# Patient Record
Sex: Male | Born: 1935 | Race: White | Hispanic: No | Marital: Married | Smoking: Former smoker
Health system: Southern US, Community
[De-identification: ages and names within clinical notes are randomized; demographics above are authoritative.]

## PROBLEM LIST (undated history)

## (undated) DIAGNOSIS — M109 Gout, unspecified: Secondary | ICD-10-CM

## (undated) DIAGNOSIS — D126 Benign neoplasm of colon, unspecified: Secondary | ICD-10-CM

## (undated) DIAGNOSIS — I350 Nonrheumatic aortic (valve) stenosis: Secondary | ICD-10-CM

## (undated) DIAGNOSIS — E785 Hyperlipidemia, unspecified: Secondary | ICD-10-CM

## (undated) DIAGNOSIS — C50922 Malignant neoplasm of unspecified site of left male breast: Secondary | ICD-10-CM

## (undated) DIAGNOSIS — N183 Chronic kidney disease, stage 3 unspecified: Secondary | ICD-10-CM

## (undated) DIAGNOSIS — I10 Essential (primary) hypertension: Secondary | ICD-10-CM

## (undated) DIAGNOSIS — K8309 Other cholangitis: Secondary | ICD-10-CM

## (undated) DIAGNOSIS — K219 Gastro-esophageal reflux disease without esophagitis: Secondary | ICD-10-CM

## (undated) DIAGNOSIS — D696 Thrombocytopenia, unspecified: Secondary | ICD-10-CM

## (undated) DIAGNOSIS — K8000 Calculus of gallbladder with acute cholecystitis without obstruction: Secondary | ICD-10-CM

## (undated) DIAGNOSIS — E119 Type 2 diabetes mellitus without complications: Secondary | ICD-10-CM

## (undated) DIAGNOSIS — R768 Other specified abnormal immunological findings in serum: Secondary | ICD-10-CM

## (undated) HISTORY — PX: WISDOM TOOTH EXTRACTION: SHX21

## (undated) HISTORY — PX: OTHER SURGICAL HISTORY: SHX169

## (undated) SURGERY — Surgical Case
Anesthesia: *Unknown

---

## 2000-03-31 ENCOUNTER — Encounter: Admission: RE | Admit: 2000-03-31 | Discharge: 2000-03-31 | Payer: Self-pay | Admitting: *Deleted

## 2001-09-16 ENCOUNTER — Encounter: Admission: RE | Admit: 2001-09-16 | Discharge: 2001-09-16 | Payer: Self-pay | Admitting: *Deleted

## 2002-08-18 ENCOUNTER — Ambulatory Visit (HOSPITAL_COMMUNITY): Admission: RE | Admit: 2002-08-18 | Discharge: 2002-08-18 | Payer: Self-pay | Admitting: *Deleted

## 2004-06-23 DIAGNOSIS — K8000 Calculus of gallbladder with acute cholecystitis without obstruction: Secondary | ICD-10-CM

## 2004-06-23 HISTORY — PX: LAPAROSCOPIC CHOLECYSTECTOMY: SUR755

## 2004-06-23 HISTORY — DX: Calculus of gallbladder with acute cholecystitis without obstruction: K80.00

## 2004-06-23 HISTORY — PX: ERCP W/ SPHICTEROTOMY: SHX1523

## 2004-06-28 ENCOUNTER — Emergency Department (HOSPITAL_COMMUNITY): Admission: EM | Admit: 2004-06-28 | Discharge: 2004-06-28 | Payer: Self-pay | Admitting: Emergency Medicine

## 2004-07-02 ENCOUNTER — Inpatient Hospital Stay (HOSPITAL_COMMUNITY): Admission: EM | Admit: 2004-07-02 | Discharge: 2004-07-08 | Payer: Self-pay | Admitting: Emergency Medicine

## 2004-07-04 ENCOUNTER — Encounter (INDEPENDENT_AMBULATORY_CARE_PROVIDER_SITE_OTHER): Payer: Self-pay | Admitting: Specialist

## 2008-02-25 ENCOUNTER — Encounter: Admission: RE | Admit: 2008-02-25 | Discharge: 2008-02-25 | Payer: Self-pay | Admitting: Internal Medicine

## 2010-07-13 ENCOUNTER — Encounter: Payer: Self-pay | Admitting: Internal Medicine

## 2010-07-13 ENCOUNTER — Encounter: Payer: Self-pay | Admitting: *Deleted

## 2010-11-08 NOTE — Op Note (Signed)
   NAME:  Jesse Hoffman, Jesse Hoffman                       ACCOUNT NO.:  [id number]   MEDICAL RECORD NO.:  [id number]                   PATIENT TYPE:  AMB   LOCATION:  ENDO                                 FACILITY:  [facility]   PHYSICIAN:  Georgiana Spinner, M.D.                 DATE OF BIRTH:  July 16, 1935   DATE OF PROCEDURE:  DATE OF DISCHARGE:                                 OPERATIVE REPORT   PROCEDURE:  Upper endoscopy.   INDICATIONS:  Gastroesophageal reflux disease.   ANESTHESIA:  Demerol 40, Versed 4 mg.   DESCRIPTION OF PROCEDURE:  With the patient mildly sedated in the left  lateral decubitus position, the Olympus videoscopic endoscope inserted in  the mouth, and passed under direct vision to the esophagus which appeared  normal.  There was no evidence of Barrett's.  The fundus, body, antrum,  duodenal bulb, second portion of the duodenum all appeared normal.  From  this point, the endoscope was slowly withdrawn taking several  circumferential views of the entire duodenal mucosa until the endoscope was  pulled back in the stomach, placed in retroflexion to view the stomach from  below.  The endoscope was straightened and withdrawn, taking circumferential  views of the remaining gastric and esophageal mucosa.  The patient's vital  signs and pulse oximetry remained stable. The patient tolerated the  procedure well without apparent complications.   FINDINGS:  Unremarkable exam.   PLAN:  Proceed with colonoscopy.                                               Georgiana Spinner, M.D.    GMO/MEDQ  D:  08/18/2002  T:  08/18/2002  Job:  [id number]

## 2010-11-08 NOTE — Discharge Summary (Signed)
Jesse Hoffman, Jesse Hoffman NO.:  [id number]   MEDICAL RECORD NO.:  [id number]          PATIENT TYPE:  INP   LOCATION:  5035                         FACILITY:  [facility]   PHYSICIAN:  Lonia Blood, M.D.      DATE OF BIRTH:  1935/08/14   DATE OF ADMISSION:  07/01/2004  DATE OF DISCHARGE:                                 DISCHARGE SUMMARY   PRIMARY CARE PHYSICIAN:  Georgianne Fick, M.D.   GASTROENTEROLOGIST:  Georgiana Spinner, M.D.   SURGEON:  Adolph Pollack, M.D.   DISCHARGE DIAGNOSES:  1.  Acute cholangitis, status post cholecystectomy and laparoscopic      cholecystectomy.  2.  Hypertension.  3.  Dyslipidemia.   DISCHARGE MEDICATIONS:  1.  Maxzide one tablet daily.  2.  Tenex 2 mg daily.  3.  Ciprofloxacin 500 mg p.o. b.i.d. for one week.  4.  Vicodin 5/500 mg one to two tablets p.o. q.6h. p.r.n.   PROCEDURES PERFORMED:  1.  Chest x-ray performed on June 28, 2004, showed linear left lower lobe      scarring or atelectasis and negative for acute process.  2.  Abdominal ultrasound performed June 28, 2004, showed gallbladder      sludge without stones or ultrasound evidence of cholecystitis and right      renal cyst.  3.  Endoscopic retrograde cholangiopancreatography with sphincterotomy      performed on July 03, 2004, showed multiple common bile duct stones      which were removed via sphincterotomy and balloon passage.  There may be      a few small stone fragments in the distal common bile duct on the final      image.  4.  Laparoscopic cholecystectomy performed by Dr. Abbey Chatters on July 04, 2004.   CONSULTATIONS:  1.  Georgiana Spinner, M.D., gastroenterology.  2.  Adolph Pollack, M.D., surgery.   BRIEF HISTORY:  Mr. Schroll is a 75 year old white male who presented with  abdominal pain, mainly right upper quadrant, described as colicky and  usually related to meals.  The patient was seen in the emergency room and at  time had normal  LFTs with gallbladder sludge noted on his ultrasound.  The  patient also had fever, chills nausea and vomiting which necessitated his  admission.  His temperature on admission was 103.7 degrees with some  elements of tachycardia.  Further assessment led to the diagnosis of acute  cholangitis versus cholecystitis.   HOSPITAL COURSE:  #1 - ACUTE CHOLANGITIS:  The patient was admitted and GI  consulted.  He was started on IV Zosyn and fluid resuscitation in the  beginning.  He responded very well to the antibiotics, however, after his  ERCP, it was noted that the patient would require cholecystectomy.  A  surgical consult was therefore sought and the patient underwent laparoscopic  cholecystectomy.  His LFTs have remained significantly high since admission.  His alkaline phosphatase was initially 380, AST 105 and ALT 118.  They  trended down after cholecystectomy to 384, ALT 80 and ALT 116 with  albumin  2.3.  As of today, however, LFTs seem to be rising again with an alkaline  phosphatase of 442, AST 76 and ALT 123.  Bilirubin still remains elevated at  3.3.  The patient is asymptomatic at this point.  However, if he becomes  symptomatic later, he may require further sphincterotomy since he has had  cholecystectomy.  He is to follow up with surgery as indicated by the end of  this week.   #2 - HYPERTENSION:  The patient has been taking two drugs at home.  During  this hospital admission, his blood pressure has gone up because his  antihypertensive were initially held.  This was restarted prior to discharge  and today his blood pressure is within acceptable limits of 139/90.   #3 - DYSLIPIDEMIA:  The patient carries a diagnosis of dyslipidemia.  He is,  however, not on any medications at this point.   #4 - HYPOKALEMIA:  The potassium level seems to be dropping during this  hospitalization mainly around 3.4.  This could be secondary to nausea and  vomiting as the patient had enteral body  depletion.  Potassium has been  repleted so far during this hospitalization.   #5 - TRANSIENT HYPONATREMIA:  This may also be related to some mild  dehydration secondary to the patient's situation.  He is, however, stable at  the time of this discharge.   DISCHARGE LABORATORIES:  The patient's sodium is still 129, potassium 3.4,  chloride 92, CO2 28, BUN 10, creatinine 0.8 and glucose 188.  His diuretics  may have something to do with his electrolytes.  Blood cultures performed  were also negative x 2.      [place]   LG/MEDQ  D:  07/08/2004  T:  07/08/2004  Job:  [id number]   cc:   Georgianne Fick, M.D.  [address]  [place]  [place] [address]  Fax: [phone]   Georgiana Spinner, M.D.  [address]  [place]  [place] [address]  Fax: [phone]   Adolph Pollack, M.D.  [address]  [place]  [place] [address]  Fax: [phone]

## 2010-11-08 NOTE — Consult Note (Signed)
Jesse Hoffman, Jesse Hoffman             ACCOUNT NO.:  [id number]   MEDICAL RECORD NO.:  [id number]          PATIENT TYPE:  INP   LOCATION:  5035                         FACILITY:  [facility]   PHYSICIAN:  Adolph Pollack, M.D.DATE OF BIRTH:  1936/05/13   DATE OF CONSULTATION:  07/02/2004  DATE OF DISCHARGE:                                   CONSULTATION   REASON FOR CONSULTATION:  Abdominal pain, jaundice, gallbladder sludge.   HISTORY OF PRESENT ILLNESS:  Mr. Statler ia s 75 year old male who has been  having some postprandial epigastric pain without radiation, nausea,  vomiting, or diarrhea.  It occurs approximately 2 hours after a meal. He  presented to the emergency department Friday, and underwent abdominal  ultrasound which showed gallbladder sludge.  Liver function tests  demonstrated a total bilirubin of 3.0, but the rest of them were not  elevated, at that time.   Last night he ate some sherbet, had severe pain, which was the worst he ever  had. He subsequently presented to the emergency department and was noted to  have fever, jaundice, and elevation of liver function tests.  He  subsequently was admitted with obstructive jaundice and we have been asked  to see him because of that. An ERCP is planned for tomorrow.   PAST MEDICAL HISTORY:  1.  Hypertension.  2.  Hyperlipidemia.   PREVIOUS OPERATIONS:  Bilateral knee arthroscopies.   ALLERGIES:  None.   HOME MEDICATIONS:  Lipitor, Maxzide, Tenex, Nexium, and Allegra.   SOCIAL HISTORY:  He is married and his wife is in the room with him. He  occasionally has an alcoholic beverage. He is not a smoker.   REVIEW OF SYSTEMS:  He has no known heart disease or dysrhythmia.  Pulmonary:  No lung disease, asthma, or tuberculosis.  Neurologic:  No  strokes or seizures. Hematologic:  No known anemia or bleeding disorders.  Endocrine:  No diabetes or thyroid disease.  GI:  No known diverticulitis or  colitis.  GU:  No kidney  stones.   PHYSICAL EXAMINATION:  GENERAL:  A well-developed, well-nourished male who  is in no acute distress, pleasant and cooperative.  VITAL SIGNS:  His maximum temperature was yesterday evening of 103.7 in the  emergency department.  Current temperature is 98 with a blood pressure of  114/65 and a pulse of 68.  SKIN:  His skin is warm and dry with jaundice.  HEENT:  Positive for icterus.  NECK:  No thyromegaly.  No JVD or bruits.  CHEST:  Breath sounds are equal and clear.  Respirations unlabored.  CARDIOVASCULAR:  Demonstrates a regular rate and rhythm with a soft systolic  murmur.  ABDOMEN:  Soft.  It is nondistended.  There is some tenderness in the right  upper quadrant and epigastric region.  He has no peritoneal signs.  The  bowel sounds are heard.  MUSCULOSKELETAL:  No edema.  Good peripheral pulses.   LABORATORY DATA:  On admission his AST was 125 and is now 143.  ALT on  admission was 119, now 126.  Total bilirubin 10.2 on admission, now 10.0.  Alkaline phosphatase 322 on admission, now 271.   Ultrasound of the gallbladder shows gallbladder sludge.   IMPRESSION:  Obstructive jaundice and acute cholangitis - white blood cell  count was 15,000 early this morning. He is currently on IV Zosyn.  ERCP is  planned for tomorrow.  He does not appear to be toxic at this time.   Gallbladder sludge which could be the etiology of his obstructive jaundice.   PLAN:  I agree with the ERCP in the a.m.  We are pending those results.  He  may need a laparoscopic cholecystectomy.  I discussed the procedure,  rationale, and risks with him.  The risks include but not limited to  bleeding, infection, anesthesia, postprandial diarrhea, accidental damage to  the small intestine, liver, or bile duct.  He seemed to understand these and  will be agreeable to proceed if it is indicated.      Tish Men  D:  07/02/2004  T:  07/02/2004  Job:  [id number]   cc:   Georgiana Spinner, M.D.  [address]  [place]  [place] [address]  Fax: [phone]   Georgianne Fick, M.D.  [address]  [place]  [place] [address]  Fax: [phone]

## 2010-11-08 NOTE — Op Note (Signed)
NAMEMACGREGOR, AESCHLIMAN             ACCOUNT NO.:  [id number]   MEDICAL RECORD NO.:  [id number]          PATIENT TYPE:  INP   LOCATION:  5035                         FACILITY:  [facility]   PHYSICIAN:  Adolph Pollack, M.D.DATE OF BIRTH:  18-Oct-1935   DATE OF PROCEDURE:  07/04/2004  DATE OF DISCHARGE:                                 OPERATIVE REPORT   PREOPERATIVE DIAGNOSIS:  Acute cholangitis and cholecystitis, status post  endoscopic retrograde cholangiopancreatography.   POSTOPERATIVE DIAGNOSIS:  Acute cholangitis and cholecystitis, status post  endoscopic retrograde cholangiopancreatography.   PROCEDURE:  Laparoscopic cholecystectomy.   SURGEON:  Adolph Pollack, M.D.   ASSISTANT:  Velora Heckler, M.D.   ANESTHESIA:  General.   INDICATIONS:  Mr. Slayden is a 75 year old male who was admitted with  obstructive jaundice.  He had some cholecystitis and cholangitis.  He  underwent an ERCP and extraction of common bile duct stones with  sphincterotomy.  He tolerated this well.  He is now brought to the operating  room for laparoscopic cholecystectomy.   TECHNIQUE:  He was seen in the holding area and then brought to the  operating room and placed supine on the operating table, and a general  anesthetic was administered.  Hair was clipped from the abdominal wall, and  the abdominal wall was sterilely prepped and draped.  Dilute Marcaine  solution was infiltrated in the subumbilical region and a small subumbilical  incision was made through the skin and subcutaneous tissue.  The midline  fascia was identified and a small incision made within the midline fascia in  the peritoneum.  The peritoneal cavity was then entered under direct vision.  A pursestring suture was placed around the fascial edges.  A Hasson trocar  was introduced to the peritoneal cavity and pneumoperitoneum was created by  insufflation of CO2 gas.   Next the laparoscope was introduced and omental adhesions to  the gallbladder  were noted.  An 11 mm trocar was placed in the epigastric port and two 5 mm  trocars placed in the right upper quadrant.  He was placed in the reverse  Trendelenburg position with his right side tilted slightly up.   Using careful blunt dissection, the omentum was dissected free from an  acutely inflamed gallbladder.  The fundus of the gallbladder was grasped and  retracted toward the right shoulder.  The infundibulum was grasped as well.  There was just a lot of inflammatory change down by the infundibulum.  I  used careful blunt dissection and stayed on the gallbladder and also used  hydrodissection to identify the cystic artery.  I isolated this and created  a window around it.  I then clipped it and divided it.  I then carefully  identified the cystic duct.  It was a short cystic duct.  I could identify  its junction with the right hepatic duct to form the common bile duct.  I  created a window around the cystic duct, put two clips on it, and then  divided it.  One of the clips fell off; however, the remaining clip had  sealed it  and throughout the case I inspected this and there was no bile  leak from it.  It was somewhat of a necrotic cystic duct, however.   I then used the electrocautery to dissect the gallbladder free from the  liver bed.  There was a small perforation in the gallbladder and some  purulent material draining from it.  The gallbladder was then placed in an  Endopouch bag.   Using 2 L of saline, the perihepatic area was copiously irrigated and fluid  evacuated.  Bleeding points in the gallbladder fossa were controlled with  cautery.  I placed Surgicel in the gallbladder fossa.  A drain was  introduced through the peritoneal cavity and placed in the gallbladder fossa  and exited out the lateralmost 5 mm trocar site.  It was anchored to the  skin with 3-0 nylon suture.   Following this, hemostasis was noted to be adequate.  We evacuated as much   irrigation fluid as possible.  I then removed the subumbilical trocar and  under laparoscopic vision closed the subumbilical fascial defect by  tightening up the pursestring suture and putting an interrupted 0 Vicryl  suture in where there was a remaining fascial defect superiorly.  Following  this, all trocars were removed and pneumoperitoneum was released.  The skin  incisions were closed with 4-0 Monocryl subcuticular stitches.  Steri-Strips  and sterile dressing were applied.   He tolerated the procedure well without any apparent complications.  He was  taken to the recovery room in satisfactory condition.      Tish Men  D:  07/04/2004  T:  07/04/2004  Job:  [id number]   cc:   Georgiana Spinner, M.D.  [address]  [place]  [place] [address]  Fax: [phone]

## 2010-11-08 NOTE — H&P (Signed)
Jesse Hoffman, Jesse Hoffman             ACCOUNT NO.:  [id number]   MEDICAL RECORD NO.:  [id number]          PATIENT TYPE:  INP   LOCATION:  1823                         FACILITY:  [facility]   PHYSICIAN:  Jonna L. Robb Matar, M.D.DATE OF BIRTH:  Dec 15, 1935   DATE OF ADMISSION:  07/01/2004  DATE OF DISCHARGE:                                HISTORY & PHYSICAL   CHIEF COMPLAINT:  Abdominal pain.   HISTORY:  This is a 75 year old white male who was seen last week with right  upper quadrant abdominal pain.  It was colicky in nature.  Started about 2  hours postprandially.  Lasted for about 4 or 5 hours and then cleared up.  He was seen in the emergency room and at that time he had a bilirubin of  3.0.  Normal liver function tests.  Ultrasound of the gallbladder showed  sludge.  He was suppose to have a HIDA done yesterday, but instead got  fever, chills, nausea or vomiting and comes into the emergency room at this  time with a bilirubin of 10.   ALLERGIES:  None.   MEDICATIONS:  Maxzide 25 daily.  Vicodin.  Nexium 40 daily.  Allegra daily  and Lipitor every other day.  He does not remember the dosages.   PAST MEDICAL HISTORY:  1.  Hypertension.  2. Allergic rhinitis.  3. Hypercholesterolemia.  4.      Borderline sugars.   FAMILY HISTORY:  His father died of heart disease as did his mother dying at  52.   SOCIAL HISTORY:  Nonsmoker, nondrinker, no drugs.  married.  One son, one  daughter, no medical problems.   REVIEW OF SYSTEMS:  The patient has had several moles removed.  No visual  problems.  He has had fever and weakness and shaking chills.  No coughing,  wheezing or asthma.  He says that the pain was fairly high up so he actually  had a cardiogram done which was felt not to show heart disease.  He has not  had any hematemesis or melena.  No hematuria, no prostate problems or STDs.  No thyroid problems.  No anemia or bleeding problems.  No skin rashes.  No  history of arthritis or  stroke.   PHYSICAL EXAMINATION:  VITAL SIGNS:  Temperature 103.7, pulse 111,  respirations 24, blood pressure 129/72, 90% on room air.  GENERAL:  He is a well-developed, jaundiced, white male presently in no  acute distress.  HEENT: Conjunctivae and lids were normal.  Sclerae were icteric.  Pupils are  reactive to light.  EOMs are full.  There is normal hearing, mucosa and  pharynx.  NECK:  Shows no carotid bruits or jugulovenous distension.  RESPIRATORY:  Effort is normal.  LUNGS:  Are clear to A&P without wheezes, rales, rhonchi or dullness.  HEART:  Regular rate and rhythm.  There is a 1/6 systolic ejection murmur.  There were no bruits, cyanosis, clubbing or edema.  ABDOMEN:  Nontender without hepatosplenomegaly or inspirational tenderness  or hernia.  EXTERNAL GENITALIA:  Normal.  Just has cervical and inguinal adenopathy.  MUSCULOSKELETAL:  Muscle strength  is 5/5 with full range of motion.  SKIN:  Jaundiced, but otherwise has no rash, lesions, nodules or induration.  CRANIAL NERVES:  There are normal cranial nerves, full EOMs.  EXTREMITIES:  DTRs are 2+ and equal.  Toes go down.  Sensation was normal.  NEUROLOGICAL:  He is alert and oriented x3.  Normal memory, judgment and  affect.   LABORATORY DATA:  White count 7.4, but with 95% neutrophils I am concerned  because a week ago his white count was 10.7.  Blood and urine cultures are  pending.  Urinalysis was positive for leukocyte esterase and nitrite.  Protime is 13.9. Potassium 3.4, normal BUN and creatinine.  Bilirubin is  10.10, SGOT 125, SGPT 119, alkaline phosphatase up to 322.  These are  significantly different than last week's readings.   IMPRESSION:  1.  Acute cholecystitis and acute cholangitis.  I will put him on Zosyn, IV      fluids, consult with Dr. Virginia Rochester and arrange for ERCP.  Most likely this is      an impacted gallstone and the patient will ultimately go on to      cholecystectomy.  2. Hypertension.  I will  hold his medications.  3.      Hypercholesterolemia.  Medications will be held.  4. Allergic rhinitis.      JLB/MEDQ  D:  07/02/2004  T:  07/02/2004  Job:  [id number]   cc:   Georgiana Spinner, M.D.  [address]  [place]  [place] [address]  Fax: [phone]

## 2010-11-08 NOTE — Op Note (Signed)
   NAME:  ORA, MCNATT                       ACCOUNT NO.:  [id number]   MEDICAL RECORD NO.:  [id number]                   PATIENT TYPE:  AMB   LOCATION:  ENDO                                 FACILITY:  [facility]   PHYSICIAN:  Georgiana Spinner, M.D.                 DATE OF BIRTH:  04-25-1936   DATE OF PROCEDURE:  DATE OF DISCHARGE:                                 OPERATIVE REPORT   PROCEDURE PERFORMED:  Colonoscopy.   ENDOSCOPIST:  Georgiana Spinner, M.D.   INDICATIONS FOR PROCEDURE:  Colon cancer screening.   ANESTHESIA:  Demerol 30 mg, Versed 3 mg.   DESCRIPTION OF PROCEDURE:  With the patient mildly sedated in the left  lateral decubitus position, the Olympus video colonoscope was inserted in  the rectum and passed under direct vision to the cecum, identified by the  ileocecal valve and appendiceal orifice.  From this point the colonoscope  was slowly withdrawn taking circumferential views of the entire colonic  mucosa stopping only in the rectum which appeared normal on direct and  showed hemorrhoids on retroflex view.  The endoscope was straightened and  withdrawn.  The patient's vital signs and pulse oximeter remained stable.  The patient tolerated the procedure well without apparent complications.   FINDINGS:  Internal hemorrhoids, diverticulosis of sigmoid colon.  Otherwise  unremarkable examination.   PLAN:  Repeat examination possibly in five years.                                                  Georgiana Spinner, M.D.    GMO/MEDQ  D:  08/18/2002  T:  08/18/2002  Job:  [id number]

## 2010-11-08 NOTE — Op Note (Signed)
Jesse Hoffman, MUSCAT NO.:  [id number]   MEDICAL RECORD NO.:  [id number]          PATIENT TYPE:  INP   LOCATION:  5035                         FACILITY:  [facility]   PHYSICIAN:  Georgiana Spinner, M.D.    DATE OF BIRTH:  [birth year]   DATE OF PROCEDURE:  07/03/2004  DATE OF DISCHARGE:                                 OPERATIVE REPORT   PROCEDURE:  ERCP with stone removal after sphincterotomy.   ANESTHESIA:  Demerol 100 mg, Versed 12.5 mg, Glucagon 1 mg given  intravenously.   DESCRIPTION OF PROCEDURE:  With the patient mildly sedated in the prone  position in Room 9 of radiology at [facility], the Olympus high-  viewing duodenoscope was inserted in the mouth, passed through the esophagus  into the stomach.  Then subsequently passed into the small bowel under  direct vision.  Then placed in the shortened position to find the ampulla of  Vater.  It was noted that this was in a diverticulum, and a photograph was  taken from the distance and also up close.  We then passed a Wilson-Cooke  tri tome catheter, and easily on the first pass got direct cannulation of  the bile duct.  Contrast was injected and it was seen that there multiple  filling defects, compatible with gallstones, seen in the common bile duct.   Therefore, a sphincterotomy was made and the sphincterotome was removed.  A  12 mm balloon catheter was passed subsequently and intubation of the common  bile duct was obtained with the catheter.  The balloon was inflated and  debris was pulled out.  We did a number of sweeps, until I felt we had  removed all of the stone and debris material; at which point, I pulled the  balloon catheter back to the ostia and inflated the balloon at that point.  Checked contrast and saw no further filling defects.  At which point, we  then deflated the balloon, pulled it back into the endoscope and removed the  endoscope.   Subsequently, radiologic confirmation was obtained  with Dr. Pecolia Ades, who  agreed.  The procedure was terminated.  The patient's vital signs, pulse  oximetry remained stable.  The patient tolerated the procedure well without  apparent complications.   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  Removed after sphincterotomy and balloon pull-through.   PLAN:  If clinically stable, proceed with laparoscopic cholecystectomy.       GMO/MEDQ  D:  07/03/2004  T:  07/03/2004  Job:  [id number]   cc:   Adolph Pollack, M.D.  [address]  [place]  [place] [address]  Fax: [phone]

## 2011-11-14 ENCOUNTER — Ambulatory Visit (INDEPENDENT_AMBULATORY_CARE_PROVIDER_SITE_OTHER): Payer: Medicare Other | Admitting: Family Medicine

## 2011-11-14 VITALS — BP 116/72 | HR 62 | Temp 97.8°F | Resp 18 | Ht 72.0 in | Wt 186.0 lb

## 2011-11-14 DIAGNOSIS — L259 Unspecified contact dermatitis, unspecified cause: Secondary | ICD-10-CM

## 2011-11-14 DIAGNOSIS — L258 Unspecified contact dermatitis due to other agents: Secondary | ICD-10-CM

## 2011-11-14 MED ORDER — FAMOTIDINE 20 MG PO TABS: 20.0000 mg | ORAL_TABLET | Freq: Two times a day (BID) | ORAL | Status: DC

## 2011-11-14 MED ORDER — CETIRIZINE HCL 10 MG PO TABS: 10.0000 mg | ORAL_TABLET | Freq: Every day | ORAL | Status: DC

## 2011-11-14 MED ORDER — PREDNISONE 10 MG PO TABS: 10.0000 mg | ORAL_TABLET | Freq: Every day | ORAL | Status: DC

## 2011-11-14 NOTE — Progress Notes (Signed)
  Subjective:    Patient ID: Jesse Hoffman, male    DOB: Jan 07, 1936, 76 y.o.   MRN: [id number]  HPI Patient presents concerned about periorbital swelling that wife noticed last night.   Today swelling around eye seems worse.  Denies ocular discharge or ocular pain. No change in visual acuity. No eye redness  No recent illness or eye trauma  Last week working in the yard and developed contact dermatitis; has similar lesion on (R) lower extremity  Review of Systems     Objective:   Physical Exam  Constitutional: He appears well-developed.  HENT:  Head: Normocephalic.  Eyes: Conjunctivae, EOM and lids are normal. Pupils are equal, round, and reactive to light. Right eye exhibits no discharge. Left eye exhibits no discharge.    Neck: Neck supple.  Cardiovascular: Normal rate, regular rhythm and normal heart sounds.   Pulmonary/Chest: Effort normal and breath sounds normal.  Neurological: He is alert.  Skin:       (L) zygomatic region erythematous lesion with discrete border and surrounding edema(see scanned picture)        Assessment & Plan:   1. Contact dermatitis  famotidine (PEPCID) 20 MG tablet, cetirizine (ZYRTEC) 10 MG tablet, predniSONE (DELTASONE) 10 MG tablet   Anticipatory guidance

## 2011-12-22 DIAGNOSIS — M109 Gout, unspecified: Secondary | ICD-10-CM | POA: Insufficient documentation

## 2011-12-22 HISTORY — DX: Gout, unspecified: M10.9

## 2011-12-25 ENCOUNTER — Ambulatory Visit (INDEPENDENT_AMBULATORY_CARE_PROVIDER_SITE_OTHER): Payer: Medicare Other | Admitting: Emergency Medicine

## 2011-12-25 ENCOUNTER — Ambulatory Visit: Payer: Medicare Other

## 2011-12-25 VITALS — BP 112/64 | HR 72 | Temp 98.4°F | Resp 16 | Ht 71.5 in | Wt 183.0 lb

## 2011-12-25 DIAGNOSIS — M79609 Pain in unspecified limb: Secondary | ICD-10-CM

## 2011-12-25 DIAGNOSIS — M79676 Pain in unspecified toe(s): Secondary | ICD-10-CM

## 2011-12-25 DIAGNOSIS — M79672 Pain in left foot: Secondary | ICD-10-CM

## 2011-12-25 LAB — POCT CBC
Granulocyte percent: 68.7 %G (ref 37–80)
HCT, POC: 43.2 % — AB (ref 43.5–53.7)
Lymph, poc: 2 (ref 0.6–3.4)
MCHC: 31.9 g/dL (ref 31.8–35.4)
MID (cbc): 0.5 (ref 0–0.9)
POC Granulocyte: 5.5 (ref 2–6.9)
POC LYMPH PERCENT: 24.6 %L (ref 10–50)
Platelet Count, POC: 184 10*3/uL (ref 142–424)
RDW, POC: 13.2 %

## 2011-12-25 MED ORDER — HYDROCODONE-ACETAMINOPHEN 5-325 MG PO TABS: 1.0000 | ORAL_TABLET | Freq: Four times a day (QID) | ORAL | Status: AC | PRN

## 2011-12-25 MED ORDER — PREDNISONE 20 MG PO TABS: ORAL_TABLET | ORAL | Status: DC

## 2011-12-25 NOTE — Patient Instructions (Addendum)
Gout Gout is an inflammatory condition (arthritis) caused by a buildup of uric acid crystals in the joints. Uric acid is a chemical that is normally present in the blood. Under some circumstances, uric acid can form into crystals in your joints. This causes joint redness, soreness, and swelling (inflammation). Repeat attacks are common. Over time, uric acid crystals can form into masses (tophi) near a joint, causing disfigurement. Gout is treatable and often preventable. CAUSES  The disease begins with elevated levels of uric acid in the blood. Uric acid is produced by your body when it breaks down a naturally found substance called purines. This also happens when you eat certain foods such as meats and fish. Causes of an elevated uric acid level include:  Being passed down from parent to child (heredity).   Diseases that cause increased uric acid production (obesity, psoriasis, some cancers).   Excessive alcohol use.   Diet, especially diets rich in meat and seafood.   Medicines, including certain cancer-fighting drugs (chemotherapy), diuretics, and aspirin.   Chronic kidney disease. The kidneys are no longer able to remove uric acid well.   Problems with metabolism.  Conditions strongly associated with gout include:  Obesity.   High blood pressure.   High cholesterol.   Diabetes.  Not everyone with elevated uric acid levels gets gout. It is not understood why some people get gout and others do not. Surgery, joint injury, and eating too much of certain foods are some of the factors that can lead to gout. SYMPTOMS   An attack of gout comes on quickly. It causes intense pain with redness, swelling, and warmth in a joint.   Fever can occur.   Often, only one joint is involved. Certain joints are more commonly involved:   Base of the big toe.   Knee.   Ankle.   Wrist.   Finger.  Without treatment, an attack usually goes away in a few days to weeks. Between attacks, you  usually will not have symptoms, which is different from many other forms of arthritis. DIAGNOSIS  Your caregiver will suspect gout based on your symptoms and exam. Removal of fluid from the joint (arthrocentesis) is done to check for uric acid crystals. Your caregiver will give you a medicine that numbs the area (local anesthetic) and use a needle to remove joint fluid for exam. Gout is confirmed when uric acid crystals are seen in joint fluid, using a special microscope. Sometimes, blood, urine, and X-ray tests are also used. TREATMENT  There are 2 phases to gout treatment: treating the sudden onset (acute) attack and preventing attacks (prophylaxis). Treatment of an Acute Attack  Medicines are used. These include anti-inflammatory medicines or steroid medicines.   An injection of steroid medicine into the affected joint is sometimes necessary.   The painful joint is rested. Movement can worsen the arthritis.   You may use warm or cold treatments on painful joints, depending which works best for you.   Discuss the use of coffee, vitamin C, or cherries with your caregiver. These may be helpful treatment options.  Treatment to Prevent Attacks After the acute attack subsides, your caregiver may advise prophylactic medicine. These medicines either help your kidneys eliminate uric acid from your body or decrease your uric acid production. You may need to stay on these medicines for a very long time. The early phase of treatment with prophylactic medicine can be associated with an increase in acute gout attacks. For this reason, during the first few months   of treatment, your caregiver may also advise you to take medicines usually used for acute gout treatment. Be sure you understand your caregiver's directions. You should also discuss dietary treatment with your caregiver. Certain foods such as meats and fish can increase uric acid levels. Other foods such as dairy can decrease levels. Your caregiver  can give you a list of foods to avoid. HOME CARE INSTRUCTIONS   Do not take aspirin to relieve pain. This raises uric acid levels.   Only take over-the-counter or prescription medicines for pain, discomfort, or fever as directed by your caregiver.   Rest the joint as much as possible. When in bed, keep sheets and blankets off painful areas.   Keep the affected joint raised (elevated).   Use crutches if the painful joint is in your leg.   Drink enough water and fluids to keep your urine clear or pale yellow. This helps your body get rid of uric acid. Do not drink alcoholic beverages. They slow the passage of uric acid.   Follow your caregiver's dietary instructions. Pay careful attention to the amount of protein you eat. Your daily diet should emphasize fruits, vegetables, whole grains, and fat-free or low-fat milk products.   Maintain a healthy body weight.  SEEK MEDICAL CARE IF:   You have an oral temperature above 102 F (38.9 C).   You develop diarrhea, vomiting, or any side effects from medicines.   You do not feel better in 24 hours, or you are getting worse.  SEEK IMMEDIATE MEDICAL CARE IF:   Your joint becomes suddenly more tender and you have:   Chills.   An oral temperature above 102 F (38.9 C), not controlled by medicine.  MAKE SURE YOU:   Understand these instructions.   Will watch your condition.   Will get help right away if you are not doing well or get worse.  Document Released: 06/06/2000 Document Revised: 05/29/2011 Document Reviewed: 09/17/2009 [employer] Patient Information 2012 [employer]. 

## 2011-12-25 NOTE — Progress Notes (Signed)
  Subjective:    Patient ID: Jesse Hoffman, male    DOB: February 11, 1936, 76 y.o.   MRN: [id number]  HPI patient had a crowbar fall ion his left  foot approximately 2 days ago.Last night he noticed pain and swelling over the base of his  great toe to he has difficulty with weightbearing. The side of his foot is also become red and exquisitely tender. Pertinent history reveals he also is on diuretics for hypertension    Review of Systems     Objective:   Physical ExamThe base of the right great toe is red and swollen to her pain with movement of the great toe. The foot itself is also somewhat puffy. The ankle is normal.    [facility] reading (PRIMARY) by  Dr. Cleta Alberts x-ray is normal no fractures  No results found for this or any previous visit. No results found for this or any previous visit. Results for orders placed in visit on 12/25/11  POCT CBC      Component Value Range   WBC 8.0  4.6 - 10.2 K/uL   Lymph, poc 2.0  0.6 - 3.4   POC LYMPH PERCENT 24.6  10 - 50 %L   MID (cbc) 0.5  0 - 0.9   POC MID % 6.7  0 - 12 %M   POC Granulocyte 5.5  2 - 6.9   Granulocyte percent 68.7  37 - 80 %G   RBC 4.59 (*) 4.69 - 6.13 M/uL   Hemoglobin 13.8 (*) 14.1 - 18.1 g/dL   HCT, POC 11.9 (*) 14.7 - 53.7 %   MCV 94.2  80 - 97 fL   MCH, POC 30.1  27 - 31.2 pg   MCHC 31.9  31.8 - 35.4 g/dL   RDW, POC 82.9     Platelet Count, POC 184  142 - 424 K/uL   MPV 7.4  0 - 99.8 fL      Assessment & Plan:  I suspect this is gout. We'll treat with a taper dose of prednisone and given the patient's medication for pain. Uric acid is pending. We'll treat with a tapered dose of prednisone I gave him a few hydrocodone for pain

## 2012-01-19 ENCOUNTER — Encounter (HOSPITAL_COMMUNITY): Payer: Self-pay | Admitting: Emergency Medicine

## 2012-01-19 ENCOUNTER — Inpatient Hospital Stay (HOSPITAL_COMMUNITY)
Admission: EM | Admit: 2012-01-19 | Discharge: 2012-01-23 | DRG: 872 | Disposition: A | Discharge status: Home / Self Care | Payer: Medicare Other | Attending: Internal Medicine | Admitting: Internal Medicine

## 2012-01-19 ENCOUNTER — Emergency Department (HOSPITAL_COMMUNITY): Payer: Medicare Other

## 2012-01-19 DIAGNOSIS — D649 Anemia, unspecified: Secondary | ICD-10-CM | POA: Diagnosis present

## 2012-01-19 DIAGNOSIS — R748 Abnormal levels of other serum enzymes: Secondary | ICD-10-CM

## 2012-01-19 DIAGNOSIS — N179 Acute kidney failure, unspecified: Secondary | ICD-10-CM

## 2012-01-19 DIAGNOSIS — R109 Unspecified abdominal pain: Secondary | ICD-10-CM

## 2012-01-19 DIAGNOSIS — N189 Chronic kidney disease, unspecified: Secondary | ICD-10-CM | POA: Diagnosis present

## 2012-01-19 DIAGNOSIS — R6510 Systemic inflammatory response syndrome (SIRS) of non-infectious origin without acute organ dysfunction: Secondary | ICD-10-CM

## 2012-01-19 DIAGNOSIS — R7989 Other specified abnormal findings of blood chemistry: Secondary | ICD-10-CM

## 2012-01-19 DIAGNOSIS — R17 Unspecified jaundice: Secondary | ICD-10-CM

## 2012-01-19 DIAGNOSIS — E860 Dehydration: Secondary | ICD-10-CM

## 2012-01-19 DIAGNOSIS — N289 Disorder of kidney and ureter, unspecified: Secondary | ICD-10-CM

## 2012-01-19 DIAGNOSIS — D696 Thrombocytopenia, unspecified: Secondary | ICD-10-CM | POA: Diagnosis present

## 2012-01-19 DIAGNOSIS — A419 Sepsis, unspecified organism: Principal | ICD-10-CM

## 2012-01-19 DIAGNOSIS — E861 Hypovolemia: Secondary | ICD-10-CM | POA: Diagnosis present

## 2012-01-19 HISTORY — DX: Gout, unspecified: M10.9

## 2012-01-19 HISTORY — DX: Essential (primary) hypertension: I10

## 2012-01-19 HISTORY — DX: Other cholangitis: K83.09

## 2012-01-19 HISTORY — DX: Other specified abnormal immunological findings in serum: R76.8

## 2012-01-19 HISTORY — DX: Calculus of gallbladder with acute cholecystitis without obstruction: K80.00

## 2012-01-19 LAB — COMPREHENSIVE METABOLIC PANEL
CO2: 24 mEq/L (ref 19–32)
Chloride: 97 mEq/L (ref 96–112)
Creatinine, Ser: 1.97 mg/dL — ABNORMAL HIGH (ref 0.50–1.35)
GFR calc Af Amer: 37 mL/min — ABNORMAL LOW (ref >90)
GFR calc non Af Amer: 32 mL/min — ABNORMAL LOW (ref >90)
Potassium: 3.5 mEq/L (ref 3.5–5.1)
Sodium: 135 mEq/L (ref 135–145)
Total Protein: 6 g/dL (ref 6.0–8.3)

## 2012-01-19 LAB — CBC WITH DIFFERENTIAL/PLATELET
Basophils Absolute: 0 10*3/uL (ref 0.0–0.1)
Basophils Relative: 0 % (ref 0–1)
Eosinophils Absolute: 0 K/uL (ref 0.0–0.7)
Eosinophils Relative: 0 % (ref 0–5)
HCT: 33.9 % — ABNORMAL LOW (ref 39.0–52.0)
Hemoglobin: 12 g/dL — ABNORMAL LOW (ref 13.0–17.0)
Lymphocytes Relative: 3 % — ABNORMAL LOW (ref 12–46)
Lymphs Abs: 0.3 K/uL — ABNORMAL LOW (ref 0.7–4.0)
MCH: 31.2 pg (ref 26.0–34.0)
MCHC: 35.4 g/dL (ref 30.0–36.0)
MCV: 88.1 fL (ref 78.0–100.0)
Monocytes Absolute: 0.6 K/uL (ref 0.1–1.0)
Monocytes Relative: 7 % (ref 3–12)
Neutro Abs: 8.1 K/uL — ABNORMAL HIGH (ref 1.7–7.7)
Neutrophils Relative %: 90 % — ABNORMAL HIGH (ref 43–77)
Platelets: 114 10*3/uL — ABNORMAL LOW (ref 150–400)
RBC: 3.85 MIL/uL — ABNORMAL LOW (ref 4.22–5.81)
RDW: 13.1 % (ref 11.5–15.5)
WBC: 9 10*3/uL (ref 4.0–10.5)

## 2012-01-19 LAB — LIPASE, BLOOD: Lipase: 43 U/L (ref 11–59)

## 2012-01-19 LAB — APTT: aPTT: 35 seconds (ref 24–37)

## 2012-01-19 LAB — LACTIC ACID, PLASMA: Lactic Acid, Venous: 2.8 mmol/L — ABNORMAL HIGH (ref 0.5–2.2)

## 2012-01-19 LAB — PROTIME-INR
INR: 1.42 (ref 0.00–1.49)
Prothrombin Time: 17.6 seconds — ABNORMAL HIGH (ref 11.6–15.2)

## 2012-01-19 LAB — COMPREHENSIVE METABOLIC PANEL WITH GFR
ALT: 291 U/L — ABNORMAL HIGH (ref 0–53)
AST: 219 U/L — ABNORMAL HIGH (ref 0–37)
Albumin: 3.3 g/dL — ABNORMAL LOW (ref 3.5–5.2)
Alkaline Phosphatase: 147 U/L — ABNORMAL HIGH (ref 39–117)
BUN: 19 mg/dL (ref 6–23)
Calcium: 8.6 mg/dL (ref 8.4–10.5)
Glucose, Bld: 136 mg/dL — ABNORMAL HIGH (ref 70–99)
Total Bilirubin: 6.8 mg/dL — ABNORMAL HIGH (ref 0.3–1.2)

## 2012-01-19 LAB — PROCALCITONIN: Procalcitonin: 29.74 ng/mL

## 2012-01-19 MED ORDER — SODIUM CHLORIDE 0.9 % IV BOLUS (SEPSIS)
1000.0000 mL | Freq: Once | INTRAVENOUS | Status: AC
  Administered 2012-01-20: 1000 mL via INTRAVENOUS

## 2012-01-19 MED ORDER — HEPARIN SODIUM (PORCINE) 5000 UNIT/ML IJ SOLN
5000.0000 [IU] | Freq: Three times a day (TID) | INTRAMUSCULAR | Status: DC
  Administered 2012-01-20: 5000 [IU] via SUBCUTANEOUS
  Filled 2012-01-19 (×4): qty 1

## 2012-01-19 MED ORDER — SODIUM CHLORIDE 0.9 % IV BOLUS (SEPSIS)
1000.0000 mL | Freq: Once | INTRAVENOUS | Status: AC
  Administered 2012-01-19: 1000 mL via INTRAVENOUS

## 2012-01-19 MED ORDER — PANTOPRAZOLE SODIUM 40 MG IV SOLR
40.0000 mg | Freq: Every day | INTRAVENOUS | Status: DC
  Administered 2012-01-20 – 2012-01-22 (×4): 40 mg via INTRAVENOUS
  Filled 2012-01-19 (×5): qty 40

## 2012-01-19 MED ORDER — SODIUM CHLORIDE 0.9 % IV BOLUS (SEPSIS): 500.0000 mL | Freq: Once | INTRAVENOUS | Status: DC

## 2012-01-19 MED ORDER — PIPERACILLIN-TAZOBACTAM 3.375 G IVPB 30 MIN
3.3750 g | Freq: Once | INTRAVENOUS | Status: AC
  Administered 2012-01-19: 3.375 g via INTRAVENOUS
  Filled 2012-01-19: qty 50

## 2012-01-19 MED ORDER — SODIUM CHLORIDE 0.9 % IV BOLUS (SEPSIS)
500.0000 mL | Freq: Once | INTRAVENOUS | Status: DC
  Administered 2012-01-19: 500 mL via INTRAVENOUS

## 2012-01-19 NOTE — H&P (Addendum)
Name: Jesse Hoffman MRN: [id number] DOB: Nov 19, 1935    LOS: 0  Referring Provider:  EDP Reason for Referral:  Hypotension  PULMONARY / CRITICAL CARE MEDICINE  HPI:  76 yo admitted on 7/29 with generalized weakness abdominal discomfort, fever and chills for several days.  Seen by PCP and noted to be hypotensive and have elevated liver enzymes.  Does report some upper abdominal discomfort, but no nausea or vomiting.   Denies frequency, urgency, dysuria.  No cough, chest pain or mucus production.  Also reports decreased oral intake.  Continued to take diuretics.  History of cholecystectomy 5 years ago.  Of note, evaluated for suspected hepatitis ([facility], about 4 years ago) but deemed to be disease free.  Past Medical History  Diagnosis Date  . Hypertension   . Gout    Past Surgical History  Procedure Date  . Cholecystectomy    Prior to Admission medications   Medication Sig Start Date End Date Taking? Authorizing Provider  atorvastatin (LIPITOR) 20 MG tablet Take 20 mg by mouth daily.   Yes Historical Provider, MD  cetirizine (ZYRTEC) 10 MG tablet Take 1 tablet (10 mg total) by mouth daily. 11/14/11 11/13/12 Yes Dois Davenport, MD  esomeprazole (NEXIUM) 40 MG capsule Take 40 mg by mouth daily before breakfast.   Yes Historical Provider, MD  famotidine (PEPCID) 20 MG tablet Take 1 tablet (20 mg total) by mouth 2 (two) times daily. 11/14/11 11/13/12 Yes Dois Davenport, MD  guanFACINE (TENEX) 2 MG tablet Take 2 mg by mouth at bedtime.   Yes Historical Provider, MD  Multiple Vitamins-Minerals (CENTRUM SILVER PO) Take 1 tablet by mouth daily.   Yes Historical Provider, MD  omega-3 acid ethyl esters (LOVAZA) 1 G capsule Take 1 g by mouth 2 (two) times daily.   Yes Historical Provider, MD  pyridOXINE (VITAMIN B-6) 100 MG tablet Take 100 mg by mouth daily.   Yes Historical Provider, MD  triamterene-hydrochlorothiazide (MAXZIDE) 75-50 MG per tablet Take 1 tablet by mouth daily.   Yes Historical  Provider, MD   Allergies No Known Allergies  Family History History reviewed. No pertinent family history. Social History  reports that he has never smoked. He does not have any smokeless tobacco history on file. He reports that he drinks alcohol. His drug history not on file.  Review Of Systems:  As per HPI, otherwise negative.  Brief patient description:  76 y/o admitted with fevers, hypotension and jaundice.  Events Since Admission: 7/29  Admitted with fevers, hypotension and jaundice  Current Status:  Vital Signs: Temp:  [98.5 F (36.9 C)-99.9 F (37.7 C)] 98.8 F (37.1 C) (07/29 2200) Pulse Rate:  [71-112] 71  (07/29 2315) Resp:  [0-20] 17  (07/29 2315) BP: (70-93)/(31-56) 93/53 mmHg (07/29 2315) SpO2:  [93 %-99 %] 98 % (07/29 2315)  Physical Examination: General:  No distress, comfortable Neuro:  Awake, alert, cooperative HEENT:  PERRL, icterus Neck:  Supple, no JVD Cardiovascular:  RRR, no murmurs Lungs:  CTAB Abdomen:  Soft, mild tenderness upper abdomen, no rebound Musculoskeletal:  Moves all extremities Skin:  Intact, jaundice  Active Problems:  SIRS (systemic inflammatory response syndrome)  Dehydration  Abdominal pain  Jaundice  Elevated liver enzymes  Acute renal failure  ASSESSMENT AND PLAN  PULMONARY No results found for this basename: PHART:5,PCO2:5,PCO2ART:5,PO2ART:5,HCO3:5,O2SAT:5 in the last 168 hours Ventilator Settings:   CXR:  7/29 >>> nad ETT:  NA  A:  No active issues. P:   Supplemental oxygen PRN to  keep SpO2 > 92%  CARDIOVASCULAR  Lab 01/19/12 2114  TROPONINI --  LATICACIDVEN 2.8*  PROBNP --   ECG:  7/29 >>> Lines: NA  A: SIRS / sepsis on the background of hypovolemia.  Marginally elevated lactate.  Intact mentation and good urine output.  MAP > 65 throughout ED stay. P:  MAP goal 60-65 Telemetry 12 lead EKG Trend lactate Trend cardiac enzymes Fluid resuscitation as below No indication for CVL / vasopressors at  this time Hold Triamterene / HCTZ  RENAL  Lab 01/19/12 2114  NA 135  K 3.5  CL 97  CO2 24  BUN 19  CREATININE 1.97*  CALCIUM 8.6  MG --  PHOS --   Intake/Output    None    Foley:  NA  A:  Acute renal failure.  Hypovolemia (decreased intake, diuretics and increased insensible losses). P:   IVF = finishing 1000 mL of NS # 3, will give two more, then reevaluate  Trend BMP  GASTROINTESTINAL  Lab 01/19/12 2114  AST 219*  ALT 291*  ALKPHOS 147*  BILITOT 6.8*  PROT 6.0  ALBUMIN 3.3*   US abdomen: 7/29 >>> Status post cholecystectomy. Pneumobilia. No evidence for hydronephrosis. Bilateral small renal cysts. No focal liver lesion.  CT abdomen / pelvis: 7/30 >>>  A: Elevated liver enzymes.  History of cholecystectomy.  History of workup for viral hepatitis (apparently negative). P:   Acute hepatitis panel Tylenol level Amylase, lipase CT abdomen with oral contrast (NO IV contrast) Trend LFTs NPO Hold Lipitor  HEMATOLOGIC  Lab 01/19/12 2114  HGB 12.0*  HCT 33.9*  PLT 114*  INR --  APTT --   A:  Thrombocytopenia, baseline is unknown. P:  Trend CBC INR/APPT  INFECTIOUS  Lab 01/19/12 2114  WBC 9.0  PROCALCITON 29.74   Cultures: 7/29  Urine >>> 7/29  Blood >>>  Antibiotics: Zosyn 7/29 >>>  A:  Suspected intraabdominal infection. P:   Antibiotics / cultures as above  ENDOCRINE No results found for this basename: GLUCAP:5 in the last 168 hours A:  Unknown adrenal function. P:   Cortisol level  NEUROLOGIC  A:  No active issues. P:   No interventions required  BEST PRACTICE / DISPOSITION Level of Care:  ICU Primary Service:  PCCM Consultants:  None Code Status:  Full Diet:  NPO DVT Px:  Heparin GI Px:  Protonix Skin Integrity:  Intact Social / Family:  Updated at bedside  Lonia Farber, M.D. Pulmonary and Critical Care Medicine [facility] Pager: [phone]  01/19/2012, 11:35 PM

## 2012-01-19 NOTE — ED Provider Notes (Signed)
History     CSN: [id number]  Arrival date & time 01/19/12  4098   First MD Initiated Contact with Patient 01/19/12 2018      Chief Complaint  Patient presents with  . Fever    (Consider location/radiation/quality/duration/timing/severity/associated sxs/prior treatment) HPI Comments: Pt began feeling generally weak about 2-3 days ago, had fevers to 102, seen by PCP who found elevated LFT's and low BP's.  Pt denies sore throat, HA, neck stiffness, cough.  No N/V/D, has had some soreness to upper abd, mild, but thought it had to do with going to [facility] and doing some stomach crunches recently.  He has had chills and fever.  Has not eaten much today.  No obv sick contacts, no seafood consumption, foreign travel.  He had  GB removed surgically about 5 years ago.  Pt sent here from MD's office due to findings and symptoms.    Patient is a 76 y.o. male presenting with fever. The history is provided by the patient and the spouse.  Fever Primary symptoms of the febrile illness include fever, fatigue and abdominal pain. Primary symptoms do not include cough, shortness of breath, nausea, vomiting or diarrhea.    Past Medical History  Diagnosis Date  . Hypertension   . Gout     Past Surgical History  Procedure Date  . Cholecystectomy     History reviewed. No pertinent family history.  History  Substance Use Topics  . Smoking status: Never Smoker   . Smokeless tobacco: Not on file  . Alcohol Use: Yes      Review of Systems  Constitutional: Positive for fever and fatigue.  HENT: Negative for congestion and neck stiffness.   Respiratory: Negative for cough and shortness of breath.   Cardiovascular: Negative for chest pain.  Gastrointestinal: Positive for abdominal pain. Negative for nausea, vomiting, diarrhea and constipation.  Genitourinary: Negative for flank pain.  Musculoskeletal: Negative for back pain.  Skin: Positive for color change.  Neurological: Positive for weakness.    All other systems reviewed and are negative.    Allergies  Review of patient's allergies indicates no known allergies.  Home Medications   Current Outpatient Rx  Name Route Sig Dispense Refill  . ATORVASTATIN CALCIUM 20 MG PO TABS Oral Take 20 mg by mouth daily.    Marland Kitchen CETIRIZINE HCL 10 MG PO TABS Oral Take 1 tablet (10 mg total) by mouth daily. 30 tablet 0  . ESOMEPRAZOLE MAGNESIUM 40 MG PO CPDR Oral Take 40 mg by mouth daily before breakfast.    . FAMOTIDINE 20 MG PO TABS Oral Take 1 tablet (20 mg total) by mouth 2 (two) times daily. 30 tablet 0  . GUANFACINE HCL 2 MG PO TABS Oral Take 2 mg by mouth at bedtime.    . CENTRUM SILVER PO Oral Take 1 tablet by mouth daily.    . OMEGA-3-ACID ETHYL ESTERS 1 G PO CAPS Oral Take 1 g by mouth 2 (two) times daily.    Marland Kitchen VITAMIN B-6 100 MG PO TABS Oral Take 100 mg by mouth daily.    . TRIAMTERENE-HCTZ 75-50 MG PO TABS Oral Take 1 tablet by mouth daily.      BP 74/31  Pulse 80  Temp 98.8 F (37.1 C) (Oral)  Resp 15  SpO2 99%  Physical Exam  Nursing note and vitals reviewed. Constitutional: He is oriented to person, place, and time. He appears well-developed and well-nourished. He is cooperative.  Non-toxic appearance. He does not have a  sickly appearance. He appears ill. No distress.  HENT:  Head: Normocephalic and atraumatic.  Mouth/Throat: Uvula is midline. Mucous membranes are dry.  Cardiovascular: Regular rhythm.   No murmur heard. Pulmonary/Chest: Effort normal. No respiratory distress. He has no wheezes. He has no rales.  Abdominal: Soft. He exhibits no distension. There is no tenderness. There is no rebound and no guarding.  Neurological: He is alert and oriented to person, place, and time.  Skin: Skin is warm. He is not diaphoretic.       jaundiced    ED Course  Procedures (including critical care time)  CRITICAL CARE Performed by: Lear Ng.   Total critical care time: 30 min  Critical care time was exclusive of  separately billable procedures and treating other patients.  Critical care was necessary to treat or prevent imminent or life-threatening deterioration.  Critical care was time spent personally by me on the following activities: development of treatment plan with patient and/or surrogate as well as nursing, discussions with consultants, evaluation of patient's response to treatment, examination of patient, obtaining history from patient or surrogate, ordering and performing treatments and interventions, ordering and review of laboratory studies, ordering and review of radiographic studies, pulse oximetry and re-evaluation of patient's condition.   Labs Reviewed  LACTIC ACID, PLASMA - Abnormal; Notable for the following:    Lactic Acid, Venous 2.8 (*)     All other components within normal limits  CBC WITH DIFFERENTIAL - Abnormal; Notable for the following:    RBC 3.85 (*)     Hemoglobin 12.0 (*)     HCT 33.9 (*)     All other components within normal limits  COMPREHENSIVE METABOLIC PANEL - Abnormal; Notable for the following:    Glucose, Bld 136 (*)     Creatinine, Ser 1.97 (*)     Albumin 3.3 (*)     AST 219 (*)     ALT 291 (*)     Alkaline Phosphatase 147 (*)     Total Bilirubin 6.8 (*)     GFR calc non Af Amer 32 (*)     GFR calc Af Amer 37 (*)     All other components within normal limits  LIPASE, BLOOD  CULTURE, BLOOD (ROUTINE X 2)  CULTURE, BLOOD (ROUTINE X 2)  URINE CULTURE  PROCALCITONIN  URINALYSIS, ROUTINE W REFLEX MICROSCOPIC   Dg Chest 2 View  01/19/2012  *RADIOLOGY REPORT*  Clinical Data: Fever, stomach pain  CHEST - 2 VIEW  Comparison: 06/28/2004  Findings: Grossly unchanged cardiac silhouette and mediastinal contours. Minimal left basilar linear heterogeneous opacities favored to represent subsegmental atelectasis or scar.  No focal airspace opacities.  No definite pleural effusion or pneumothorax. No acute osseous abnormalities.  IMPRESSION: Minimal left basilar  atelectasis/scar without acute cardiopulmonary disease.  Original Report Authenticated By: Waynard Reeds, M.D.   I reviewed the above plain film myself and agree with radiologist interpretation.  1. Sepsis   2. Elevated LFTs   3. Renal insufficiency     10:40 PM Pt is now on 3rd liter of IVF's.  HR is down to 70's, however BP remains low in the lower 80 systolic range.  Mentation remains intact.  Will ask PCCM physician to see pt for admission to ICU.  Procalcitonin is 30 and lactic acid is elevated.   10:59 PM Remains relatively hypotensive, will be seen by ICU to be admitted.     MDM  Pt is likely septic with hypotension, tachycardia, elevated  LFT's and likely source of infection being in biliary tree.  Will start IV zosyn and resuscitate with IVF's, and closely monitor.  Will need admit to step down versus ICU pending response to IVF's.  Cultures pending.  Not altered, unlikely to be meningitis, pneumonia or UTI based on clinical findings and history.          Gavin Pound. Oletta Lamas, MD 01/19/12 2259

## 2012-01-19 NOTE — ED Notes (Signed)
Fever started yesterday as high as 102 went to dr today  And his bp was low

## 2012-01-19 NOTE — Progress Notes (Signed)
ANTIBIOTIC CONSULT NOTE - INITIAL  Pharmacy Consult for zosyn Indication: empiric  No Known Allergies  Vital Signs: Temp: 98.5 F (36.9 C) (07/29 2051) Temp src: Oral (07/29 2051) BP: 85/48 mmHg (07/29 2101) Pulse Rate: 95  (07/29 2101) Intake/Output from previous day:   Intake/Output from this shift:    Labs: No results found for this basename: WBC:3,HGB:3,PLT:3,LABCREA:3,CREATININE:3 in the last 72 hours CrCl is unknown because no creatinine reading has been taken. No results found for this basename: VANCOTROUGH:2,VANCOPEAK:2,VANCORANDOM:2,GENTTROUGH:2,GENTPEAK:2,GENTRANDOM:2,TOBRATROUGH:2,TOBRAPEAK:2,TOBRARND:2,AMIKACINPEAK:2,AMIKACINTROU:2,AMIKACIN:2, in the last 72 hours   Microbiology: No results found for this or any previous visit (from the past 720 hour(s)).  Medical History: Past Medical History  Diagnosis Date  . Hypertension   . Gout     Medications:   (Not in a hospital admission)  Assessment: 48 yom presented from PCP office with hypotension, fever and elevated LFTs. To start empiric zosyn for treatment. Noted that labs from MD office reported Scr of 1.7. Pt confirmed no drug allergies.   Plan:  1. Zosyn 3.375gm IV x 1 now over 30 minutes then Zosyn 3.375gm IV Q8H (4 hour infusion) 2. F/u renal fxn, C&S and infectious source  Tashi Band, Drake Leach 01/19/2012,9:27 PM

## 2012-01-19 NOTE — ED Notes (Signed)
Pt tried but was unable to give a urine sample.

## 2012-01-19 NOTE — ED Notes (Signed)
Having abd pain also

## 2012-01-19 NOTE — ED Notes (Signed)
Report received from [place], [place].  Patient to move to D30 after ultrasound is done.

## 2012-01-19 NOTE — ED Notes (Signed)
Pt has labs ( cbc and cmet ) with that were drawn in dr office and has high liver enzymes

## 2012-01-20 ENCOUNTER — Encounter (HOSPITAL_COMMUNITY): Payer: Self-pay | Admitting: Physician Assistant

## 2012-01-20 ENCOUNTER — Inpatient Hospital Stay (HOSPITAL_COMMUNITY): Payer: Medicare Other

## 2012-01-20 LAB — ACETAMINOPHEN LEVEL: Acetaminophen (Tylenol), Serum: <15 ug/mL (ref 10–30)

## 2012-01-20 LAB — CORTISOL: Cortisol, Plasma: 42.7 ug/dL

## 2012-01-20 LAB — CARDIAC PANEL(CRET KIN+CKTOT+MB+TROPI)
Relative Index: INVALID (ref 0.0–2.5)
Relative Index: INVALID (ref 0.0–2.5)
Relative Index: INVALID (ref 0.0–2.5)
Total CK: 73 U/L (ref 7–232)
Total CK: 82 U/L (ref 7–232)
Troponin I: <0.3 ng/mL (ref <0.30)
Troponin I: <0.3 ng/mL (ref <0.30)

## 2012-01-20 LAB — COMPREHENSIVE METABOLIC PANEL
ALT: 221 U/L — ABNORMAL HIGH (ref 0–53)
AST: 147 U/L — ABNORMAL HIGH (ref 0–37)
Calcium: 7.1 mg/dL — ABNORMAL LOW (ref 8.4–10.5)
Creatinine, Ser: 1.85 mg/dL — ABNORMAL HIGH (ref 0.50–1.35)
GFR calc non Af Amer: 34 mL/min — ABNORMAL LOW (ref >90)
Sodium: 138 mEq/L (ref 135–145)
Total Protein: 5 g/dL — ABNORMAL LOW (ref 6.0–8.3)

## 2012-01-20 LAB — LACTIC ACID, PLASMA: Lactic Acid, Venous: 2.1 mmol/L (ref 0.5–2.2)

## 2012-01-20 LAB — URINALYSIS, ROUTINE W REFLEX MICROSCOPIC
Ketones, ur: 15 mg/dL — AB
Nitrite: NEGATIVE
Specific Gravity, Urine: 1.014 (ref 1.005–1.030)
Urobilinogen, UA: 1 mg/dL (ref 0.0–1.0)
pH: 5.5 (ref 5.0–8.0)

## 2012-01-20 LAB — URINE MICROSCOPIC-ADD ON

## 2012-01-20 LAB — HEPATITIS PANEL, ACUTE
HCV Ab: NEGATIVE
Hep A IgM: NEGATIVE
Hepatitis B Surface Ag: NEGATIVE

## 2012-01-20 LAB — GLUCOSE, CAPILLARY: Glucose-Capillary: 128 mg/dL — ABNORMAL HIGH (ref 70–99)

## 2012-01-20 LAB — BILIRUBIN, FRACTIONATED(TOT/DIR/INDIR)
Bilirubin, Direct: 4.2 mg/dL — ABNORMAL HIGH (ref 0.0–0.3)
Total Bilirubin: 6.1 mg/dL — ABNORMAL HIGH (ref 0.3–1.2)

## 2012-01-20 LAB — AMYLASE: Amylase: 46 U/L (ref 0–105)

## 2012-01-20 MED ORDER — CHLORHEXIDINE GLUCONATE 0.12 % MT SOLN
15.0000 mL | Freq: Two times a day (BID) | OROMUCOSAL | Status: DC
  Administered 2012-01-20 – 2012-01-21 (×3): 15 mL via OROMUCOSAL
  Filled 2012-01-20 (×5): qty 15

## 2012-01-20 MED ORDER — BIOTENE DRY MOUTH MT LIQD: 15.0000 mL | Freq: Two times a day (BID) | OROMUCOSAL | Status: DC

## 2012-01-20 MED ORDER — PIPERACILLIN-TAZOBACTAM 3.375 G IVPB
3.3750 g | Freq: Three times a day (TID) | INTRAVENOUS | Status: DC
  Administered 2012-01-20 – 2012-01-22 (×7): 3.375 g via INTRAVENOUS
  Filled 2012-01-20 (×11): qty 50

## 2012-01-20 MED ORDER — SODIUM CHLORIDE 0.9 % IV SOLN
INTRAVENOUS | Status: DC
  Administered 2012-01-20 – 2012-01-23 (×4): via INTRAVENOUS

## 2012-01-20 NOTE — Progress Notes (Signed)
Name: Jesse Hoffman MRN: [id number] DOB: 06-05-1936    LOS: 1  PCP - Nicholos Johns Referring Provider:  EDP Reason for Referral:  Hypotension  PULMONARY / CRITICAL CARE MEDICINE  Brief patient description:  76 y/o admitted with fevers, hypotension and jaundice. Noted to have elevated liver enzymes and pneumobilia and perinephrotic stranding on CT. H/o Cholecystectomy 5 years ago and Hepatitis workup at [facility] 4 years ago that was normal  Events Since Admission: 7/29  Admitted with fevers, hypotension and jaundice  Current Status: Awake, alert and feeling well  Vital Signs: Temp:  [98.5 F (36.9 C)-99.9 F (37.7 C)] 98.8 F (37.1 C) (07/29 2200) Pulse Rate:  [63-112] 63  (07/30 0315) Resp:  [0-26] 17  (07/30 0315) BP: (62-110)/(21-67) 92/49 mmHg (07/30 0315) SpO2:  [93 %-100 %] 98 % (07/30 0115) Weight:  [194 lb 14.2 oz (88.4 kg)] 194 lb 14.2 oz (88.4 kg) (07/30 0200)  Physical Examination: General:  No distress, comfortable Neuro:  Awake, alert, cooperative HEENT:  PERRL, icterus Neck:  Supple, no JVD Cardiovascular:  RRR, no murmurs Lungs:  CTAB Abdomen: soft, non tender, BS + Musculoskeletal:  Moves all extremities Skin:  Intact, jaundice  Active Problems:  SIRS (systemic inflammatory response syndrome)  Dehydration  Abdominal pain  Jaundice  Elevated liver enzymes  Acute renal failure  ASSESSMENT AND PLAN   CARDIOVASCULAR  Lab 01/20/12 0424 01/19/12 2333 01/19/12 2114  TROPONINI <0.30 <0.30 --  LATICACIDVEN 2.0 2.0 2.8*  PROBNP -- -- --   ECG:  7/29 >>> Lines: NA  A: SIRS / sepsis on the background of hypovolemia.  Marginally elevated lactate, now resolved. CE normal.  Intact mentation and good urine output.  MAP >70 since admission. Improved with fluids alone P:  MAP goal 60-65 Telemetry IVF as below Hold Triamterene / HCTZ  RENAL  Lab 01/20/12 0500 01/19/12 2114  NA 138 135  K 4.6 3.5  CL 107 97  CO2 21 24  BUN 20 19  CREATININE 1.85*  1.97*  CALCIUM 7.1* 8.6  MG -- --  PHOS -- --   Intake/Output      07/29 0701 - 07/30 0700 07/30 0701 - 07/31 0700   I.V. (mL/kg) 2000 (22.6)    Total Intake(mL/kg) 2000 (22.6)    Net +2000          Foley:  NA  A:  Acute on chronic renal failure. Baseline Cr  at 1.7.  Hypovolemia (decreased intake, diuretics and increased insensible losses) w/ increased workout regimen adn poor . 5L NS boluses since admission. Fluid positive 2.2 L since admission.  P:   - BMET in am - Strict I/O - Decrease NS 82ml/hr  GASTROINTESTINAL  Lab 01/20/12 0500 01/19/12 2114  AST 147* 219*  ALT 221* 291*  ALKPHOS 103 147*  BILITOT 6.1* 6.8*  PROT 5.0* 6.0  ALBUMIN 2.7* 3.3*   US abdomen: 7/29 >>> Status post cholecystectomy. Pneumobilia. No evidence for hydronephrosis. Bilateral small renal cysts. No focal liver lesion.  CT abdomen / pelvis: 7/30 >>>Pneumobilia is nonspecific. Correlate with recent procedure/sphincterotomy.  Linear area of low attenuation within the right hepatic lobe. Correlate clinically with any history of procedure such as  transhepatic biliary drain placement. I cannot confirm patency of the adjacent intrahepatic portal vein. Consider contrast enhanced examination or liver MRI. Nonspecific perinephric fat stranding. Correlate with urinalysis if concerned for ascending infection. Small amount of free fluid within the pelvis   A: Elevated liver enzymes, trending down. Normal enzymes on  recent outpt labs from April.  History of cholecystectomy after complicated cholecystitis and apparent biliary tree infection. History of workup for viral hepatitis (apparently negative at [facility]). PNeumobilia on CT. Tylenol level normal. Coags normal. Holding Lipitor P:   Acute hepatitis panel pending Amylase, lipase pending Trend LFTs - fractionate bili Restart regular diet DC Lactate GI consult  HEMATOLOGIC  Lab 01/20/12 0500 01/19/12 2333 01/19/12 2114  HGB 10.4* -- 12.0*  HCT 29.5* --  33.9*  PLT 87* -- 114*  INR -- 1.42 --  APTT -- 35 --   A:  Thrombocytopenia, baseline is unknown. Normal Plt, Bili from labwork from April. DC hep as pt ambulating P:  Trend CBC Haptoglobin - doubt hemolysis   INFECTIOUS  Lab 01/20/12 0500 01/19/12 2114  WBC 11.4* 9.0  PROCALCITON -- 29.74   Cultures: 7/29  Urine >>> 7/29  Blood >>>  Antibiotics: Zosyn 7/29 >>>  A:  Suspected intraabdominal infection. P:   Antibiotics / cultures as above  ENDOCRINE No results found for this basename: GLUCAP:5 in the last 168 hours A:  Unknown adrenal function. P:   Cortisol level    BEST PRACTICE / DISPOSITION Level of Care: Transfer to Med-surg Primary Service:  Tr to [facility] Consultants:  None Code Status:  Full Diet:  NPO DVT Px:  SCD and ambulating GI Px:  Protonix Skin Integrity:  Intact Social / Family:  Updated at bedside  Shelly Flatten, MD Family Medicine PGY-2 01/20/2012, 7:21 AM  Care during the described time interval was provided by me and/or other providers on the critical care team.  I have reviewed this patient's available data, including medical history, events of note, physical examination and test results as part of my evaluation  ALVA,RAKESH V.  [phone]

## 2012-01-20 NOTE — Consult Note (Signed)
[facility] Consult: 12:40 PM 01/20/2012   Referring Provider: Oretha Milch Primary Care Physician:  Georgianne Fick, MD Primary Gastroenterologist:  Dr. Virginia Rochester   Reason for Consultation:  cholesatic hepatitis.   HPI: Jesse Hoffman is a 76 y.o. male. 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  He did not have a liver biopsy.   Acute flare gout, left Podagra on 12/25/11.  Treated with Prednisone taper.  Flared again and started on Allopurinol, BID aleve 7/14.  Stopped the latter several days PTA.   Returned to gym last Thursday and Friday after a several week hiatus, did a lot of abd excercises and his belly felt sore. On Monday started feeling poorly, anorexic, rigors, chills, lethargy, queasy, no emesis or worsening pain.  At PMDs office T bili 6.4, alk phos 24o, AST 300, ALT 416.  These had been 0.8, 86, 20, 18 in April 2013.  Platelets were 132 compared to 192 in April He had fever of 102.6 and BP of 88/54 Admitted to [facility] service 7/29 Lactate level marginally elevated. Creatinine1.8.  U/A negative. PT INR of 17.6, 1.2   Imaging includes ultrasound and CT These show pneumobilia, area of low attenuation within the right hepatic lobe.  Unable to confirm patency of intrahepatic portal vein.  Non-specific perinephric stranding.   He is feeling well now.  Eating solids, no belly pain.  No nausea.  Not dizzy, feeling stronger. No dark colored urine.    ROS No hx jaundice.  No bleeding problems.  No tylenol in use.  Drinks one glass wine 1 to 2 times per week. Gout pain is gone. No dizziness Appetite improved, no nausea No chest pain or palpitations.  No pedal edema. No visual disturbance. No headaches.  No seizures.   No blood transfusions.    Past Medical History  Diagnosis Date  . Hypertension   . Gout July 2013  . Hepatitis B antibody positive     false  positive hep b test, evaluated at [facility].   . Ascending cholangitis 2006  . Cholecystitis, acute with cholelithiasis 2006    Past Surgical History  Procedure Date  . Laparoscopic cholecystectomy 06/2004    dr Abbey Chatters  . Ercp w/ sphicterotomy 06/2004     Ampulla in a diverticulum with multiple stones, found in the common bile duct    Prior to Admission medications   Medication Sig Start Date End Date Taking? Authorizing Provider  atorvastatin (LIPITOR) 20 MG tablet Take 20 mg by mouth daily.   Yes Historical Provider, MD  cetirizine (ZYRTEC) 10 MG tablet Take 1 tablet (10 mg total) by mouth daily. 11/14/11 11/13/12 Yes Dois Davenport, MD  esomeprazole (NEXIUM) 40 MG capsule Take 40 mg by mouth daily before breakfast.   Yes Historical Provider, MD  famotidine (PEPCID) 20 MG tablet Take 1 tablet (20 mg total) by mouth 2 (two) times daily. 11/14/11 11/13/12 Yes Dois Davenport, MD  guanFACINE (TENEX) 2 MG tablet Take 2 mg by mouth at bedtime.   Yes Historical Provider, MD  Multiple Vitamins-Minerals (CENTRUM SILVER PO) Take 1 tablet by mouth daily.   Yes Historical Provider, MD  omega-3 acid ethyl esters (LOVAZA) 1 G capsule Take 1 g by mouth 2 (two) times daily.   Yes Historical Provider, MD  pyridOXINE (VITAMIN B-6) 100 MG tablet Take 100 mg by mouth daily.   Yes Historical Provider, MD  triamterene-hydrochlorothiazide (MAXZIDE) 75-50 MG per tablet Take  1 tablet by mouth daily.   Yes Historical Provider, MD    Scheduled Meds:    . antiseptic oral rinse  15 mL Mouth Rinse q12n4p  . chlorhexidine  15 mL Mouth Rinse BID  . pantoprazole (PROTONIX) IV  40 mg Intravenous QHS  . piperacillin-tazobactam  3.375 g Intravenous Once  . piperacillin-tazobactam (ZOSYN)  IV  3.375 g Intravenous Q8H  . sodium chloride  1,000 mL Intravenous Once  . sodium chloride  1,000 mL Intravenous Once  . sodium chloride  1,000 mL Intravenous Once  . sodium chloride  1,000 mL Intravenous Once  . sodium chloride   1,000 mL Intravenous Once   Infusions:   . sodium chloride 50 mL/hr at 01/20/12 0953     Allergies as of 01/19/2012  . (No Known Allergies)    History reviewed. No pertinent family history.  History   Social History  . Marital Status: Married    Spouse Name: N/A    Number of Children: N/A  . Years of Education: N/A   Occupational History  . Not on file.   Social History Main Topics  . Smoking status: Never Smoker   . Smokeless tobacco: Not on file  . Alcohol Use: Yes  . Drug Use: Not on file  . Sexually Active: Not on file     PHYSICAL EXAM: Vital signs in last 24 hours: Temp:  [97.9 F (36.6 C)-99.9 F (37.7 C)] 97.9 F (36.6 C) (07/30 1240) Pulse Rate:  [58-112] 69  (07/30 1240) Resp:  [0-30] 18  (07/30 1240) BP: (61-126)/(21-77) 125/77 mmHg (07/30 1240) SpO2:  [93 %-100 %] 99 % (07/30 1240) Weight:  [194 lb 14.2 oz (88.4 kg)] 194 lb 14.2 oz (88.4 kg) (07/30 0200)  General: well appearing older wm.  No distress Head:  No facial edema or asymmetry  Eyes:  No conj pallor, mild icterus. Ears:  Not HOH  Nose:  No bleeding or discharge Mouth:  No lesions.  Clear, moist MM Neck:  No mass or TMG Lungs:  Crackles in bases right >> left Heart: RRR.  No MRG Abdomen:  Soft, NT, ND.  No HSM, no mass.  Active BS.   Rectal: not done   Musc/Skeltl: no joint swelling or deformity. Extremities:  No pedal edema  Neurologic:  Pleasant, not confused,  No tremor.  No gross deficits Skin:  No rash, sores, angiomata Tattoos:  none Nodes:  No groin or cervical adenopathy   Psych:  Pleasant, no agitation or depression  Intake/Output from previous day: 07/29 0701 - 07/30 0700 In: 2190 [I.V.:2140; IV Piggyback:50] Out: -  Intake/Output this shift: Total I/O In: 125.8 [I.V.:125.8] Out: -   LAB RESULTS:  Basename 01/20/12 0500 01/19/12 2114  WBC 11.4* 9.0  HGB 10.4* 12.0*  HCT 29.5* 33.9*  PLT 87* 114*   BMET Lab Results  Component Value Date   NA 138  01/20/2012   NA 135 01/19/2012   K 4.6 01/20/2012   K 3.5 01/19/2012   CL 107 01/20/2012   CL 97 01/19/2012   CO2 21 01/20/2012   CO2 24 01/19/2012   GLUCOSE 158* 01/20/2012   GLUCOSE 136* 01/19/2012   BUN 20 01/20/2012   BUN 19 01/19/2012   CREATININE 1.85* 01/20/2012   CREATININE 1.97* 01/19/2012   CALCIUM 7.1* 01/20/2012   CALCIUM 8.6 01/19/2012   LFT  Basename 01/20/12 0500 01/19/12 2114  PROT 5.0* 6.0  ALBUMIN 2.7* 3.3*  AST 147* 219*  ALT 221* 291*  ALKPHOS 103 147*  BILITOT 6.1* 6.8*  BILIDIR -- --  IBILI -- --   PT/INR Lab Results  Component Value Date   INR 1.42 01/19/2012   Hepatitis Panel  Basename 01/19/12 2333  HEPBSAG NEGATIVE  HCVAB NEGATIVE  HEPAIGM PENDING  HEPBIGM PENDING     RADIOLOGY STUDIES: Ct Abdomen Pelvis Wo Contrast 01/20/2012  *RADIOLOGY REPORT*  Clinical Data: Suspected abdominal sepsis.  CT ABDOMEN AND PELVIS WITHOUT CONTRAST  Technique:  Multidetector CT imaging of the abdomen and pelvis was performed following the standard protocol without intravenous contrast.  Comparison: 01/19/2012 ultrasound  Findings: Linear lung base opacities.  Coronary artery and aortic valve calcifications.  Organ abnormality/lesion detection is limited in the absence of intravenous contrast. Within this limitation, intra and extrahepatic pneumobilia.  Absent gallbladder.  No focal abnormality within the spleen, pancreas, adrenal glands. There is a linear area of low attenuation within the right hepatic lobe. Correlate clinically with any history of procedure such as transhepatic biliary drain placement.  Nonspecific bilateral perinephric fat stranding.  There are at least two were cysts on the right.  An upper pole lesion on the left is occluded characterize by CT though favored to reflect a hemorrhagic or proteinaceous cyst on recent ultrasound.  No hydronephrosis or hydroureter.  No bowel obstruction. Small duodenal diverticulum.  Colonic diverticulosis without CT evidence for  diverticulitis.  Normal appendix.  No free intraperitoneal air.  Small amount of free fluid.  No lymphadenopathy.  There is scattered atherosclerotic calcification of the aorta and its branches. No aneurysmal dilatation.  Prostatomegaly.  Circumferential bladder wall thickening.  Multilevel degenerative changes of the imaged spine. No acute or aggressive appearing osseous lesion.  IMPRESSION: Pneumobilia is nonspecific.  Correlate with recent procedure/sphincterotomy.  Linear area of low attenuation within the right hepatic lobe. Correlate clinically with any history of procedure such as transhepatic biliary drain placement.  I cannot confirm patency of the adjacent intrahepatic portal vein.  Consider contrast enhanced examination or liver MRI.  Nonspecific perinephric fat stranding.  Correlate with urinalysis if concerned for ascending infection.  Small amount of free fluid within the pelvis.  Original Report Authenticated By: Waneta Martins, M.D.   Dg Chest 2 View 01/19/2012  *RADIOLOGY REPORT*  Clinical Data: Fever, stomach pain  CHEST - 2 VIEW  Comparison: 06/28/2004  Findings: Grossly unchanged cardiac silhouette and mediastinal contours. Minimal left basilar linear heterogeneous opacities favored to represent subsegmental atelectasis or scar.  No focal airspace opacities.  No definite pleural effusion or pneumothorax. No acute osseous abnormalities.  IMPRESSION: Minimal left basilar atelectasis/scar without acute cardiopulmonary disease.  Original Report Authenticated By: Waynard Reeds, M.D.   US Abdomen Complete 01/19/2012  *RADIOLOGY REPORT*  Clinical Data:  Elevated LFTs.  Status post cholecystectomy and placement of biliary stent.  COMPLETE ABDOMINAL ULTRASOUND  Comparison:  Ultrasound 02/25/2008  Findings:  Gallbladder:  The patient is status post cholecystectomy.  Common bile duct:  Common bile duct is 5.8 mm.  There is air within the intrahepatic biliary ducts, consistent with history of  cholecystectomy and stent placement.  Liver:  Homogeneous without focal lesion.  IVC:  Appears normal.  Pancreas:  The pancreas is not well seen because of overlying bowel gas.  Spleen:  The spleen is normal in appearance, 13.7 cm in length.  Right Kidney:  12.0 cm in length.  Small cyst in the midpole region is 2.3 cm.  Left Kidney:  13.0 cm in length.  Upper pole cyst is 1.3  cm.  Abdominal aorta:  Visualized portion of the abdominal aorta is not aneurysmal.  Distal aspect is not well seen because of overlying bowel gas.  IMPRESSION:  1.  Status post cholecystectomy. 2.  Pneumobilia, consistent with previous surgery. 3.  No evidence for hydronephrosis. 4.  Bilateral small renal cysts. 5.  No focal liver lesion.  Original Report Authenticated By: Patterson Hammersmith, M.D.    ENDOSCOPIC STUDIES: Colonoscopy   Dr Virginia Rochester 07/2002 FINDINGS: Internal hemorrhoids, diverticulosis of sigmoid colon. Otherwise  unremarkable examination.   EGD  07/2002 FINDINGS: Unremarkable exam.  ERCP  06/2004 FINDINGS: Ampulla in a diverticulum with multiple stones, found in the  common bile duct. Removed after sphincterotomy and balloon pull-through.  PLAN: If clinically stable, proceed with laparoscopic cholecystectomy.     IMPRESSION: *  Acute cholestatic jaundice .  Wonder if this is from Allopurinol induced liver injury.  LFTs are improving. Non-specific right sided low attenuation on liver.  Worked up at [facility] for positive hepatitis B, determined to be a false positive.  Currently the Hep B IgM is pending.  Hep B surface Ag is negative.  *  Pneumobilia.  Normal post sphincterotomy finding.  *  SIRS, mild.   *  Thrombocytopenia.  *  Acute anemia, suspect dilutional effect is at play.  He received 4.5 liters on 7/29, one liter on 7/30.  Fluids currently at 50 ml/hour. Baseline hgb is 13.4 in 09/2011 *  ARF.  Likely from dehydration.  Baseline creat 1.3 in 09/2011.  Diuretics on hold  PLAN: *  Per Dr Christella Hartigan.  *  CBC  and CMET in AM.   LOS: 1 day   Jennye Moccasin  01/20/2012, 12:40 PM Pager: [phone]      ________________________________________________________________________  [facility] MD note:  I personally examined the patient, reviewed the data and agree with the assessment and plan described above.  Very nice man, daughter with him in room this afternoon.  Most likely this is from residual, recurrent stone disease in bile duct.  Alternatively meds could have caused it, perhaps systemic illness (perineprhic stranding) led to hypotension and LFTs (as well as Cr) reflect mild ischemic insult.  Will follow LFTs, if they do not signficantly improve tomorrow, then MRCP images of biliary tree and if that shows stones then ERCP.  CBD however was non-dilated on [place], CT.     Rob Bunting, MD [facility] Pager [phone]

## 2012-01-20 NOTE — Progress Notes (Signed)
Patient transferred to room 6N room 28. Alert and oriented. Denies any pain. IVF infusing. Request to remain sitting in recliner. Oriented to room. Call bell in reach. Family member (wife) at bedside.

## 2012-01-20 NOTE — Plan of Care (Signed)
Problem: Consults Goal: General Medical Patient Education See Patient Education Module for specific education. Outcome: Completed/Met Date Met:  01/20/12 Admit to 2108, introductions made, visitation guidline reviewed  Problem: Phase I Progression Outcomes Goal: Pain controlled with appropriate interventions Outcome: Completed/Met Date Met:  01/20/12 Denies pain at this time Goal: Initial discharge plan identified Outcome: Completed/Met Date Met:  01/20/12 Plan to dc home when well

## 2012-01-21 ENCOUNTER — Inpatient Hospital Stay (HOSPITAL_COMMUNITY): Payer: Medicare Other

## 2012-01-21 LAB — CBC
Hemoglobin: 10.4 g/dL — ABNORMAL LOW (ref 13.0–17.0)
MCH: 31.4 pg (ref 26.0–34.0)
MCHC: 35.3 g/dL (ref 30.0–36.0)
MCV: 89.1 fL (ref 78.0–100.0)
MCV: 88.6 fL (ref 78.0–100.0)
Platelets: 87 10*3/uL — ABNORMAL LOW (ref 150–400)
Platelets: 100 10*3/uL — ABNORMAL LOW (ref 150–400)
RBC: 3.34 MIL/uL — ABNORMAL LOW (ref 4.22–5.81)
RDW: 13.5 % (ref 11.5–15.5)
WBC: 8.4 10*3/uL (ref 4.0–10.5)

## 2012-01-21 LAB — COMPREHENSIVE METABOLIC PANEL
Albumin: 2.7 g/dL — ABNORMAL LOW (ref 3.5–5.2)
BUN: 19 mg/dL (ref 6–23)
Calcium: 7.7 mg/dL — ABNORMAL LOW (ref 8.4–10.5)
Chloride: 109 mEq/L (ref 96–112)
Creatinine, Ser: 1.63 mg/dL — ABNORMAL HIGH (ref 0.50–1.35)
GFR calc non Af Amer: 40 mL/min — ABNORMAL LOW (ref >90)
Total Bilirubin: 4 mg/dL — ABNORMAL HIGH (ref 0.3–1.2)

## 2012-01-21 LAB — URINE CULTURE
Colony Count: NO GROWTH
Culture: NO GROWTH

## 2012-01-21 LAB — HAPTOGLOBIN: Haptoglobin: 134 mg/dL (ref 45–215)

## 2012-01-21 MED ORDER — OXYCODONE HCL 5 MG PO TABS
5.0000 mg | ORAL_TABLET | Freq: Four times a day (QID) | ORAL | Status: DC | PRN
  Administered 2012-01-21: 5 mg via ORAL
  Filled 2012-01-21: qty 1

## 2012-01-21 MED ORDER — GADOBENATE DIMEGLUMINE 529 MG/ML IV SOLN
10.0000 mL | Freq: Once | INTRAVENOUS | Status: AC
  Administered 2012-01-21: 10 mL via INTRAVENOUS

## 2012-01-21 NOTE — Progress Notes (Signed)
     Jesse Jesse Daily Rounding Note 01/21/2012, 11:05 AM  SUBJECTIVE:       No belly pain.  No nausea.  Appetite poor.  2 BMs yesterday. Feels that his eyelids are puffy and swollen  OBJECTIVE:         Vital signs in last 24 hours:    Temp:  [97.2 F (36.2 C)-98.8 F (37.1 C)] 97.2 F (36.2 C) (07/31 0510) Pulse Rate:  [63-84] 73  (07/31 0510) Resp:  [18-21] 18  (07/31 0510) BP: (104-130)/(62-95) 118/76 mmHg (07/31 0510) SpO2:  [96 %-99 %] 96 % (07/31 0510) Last BM Date: 01/20/12 General:    Heart: look unwell, eyelids are a bit edematous Chest: clear Abdomen: soft, active BS, NT, ND  Extremities: no pedal edema Neuro/Psych:  Pleasant, not confused.    Intake/Output from previous day: 07/30 0701 - 07/31 0700 In: 1215.8 [P.O.:240; I.V.:975.8] Out: 1850 [Urine:1850]  Intake/Output this shift: Total I/O In: -  Out: 925 [Urine:925]  Lab Results:  Basename 01/21/12 0620 01/20/12 0500 01/19/12 2114  WBC 8.4 11.4* 9.0  HGB 10.4* 10.4* 12.0*  HCT 29.6* 29.5* 33.9*  PLT 100* 87* 114*   BMET  Basename 01/21/12 0620 01/20/12 0500 01/19/12 2114  NA 140 138 135  K 3.5 4.6 3.5  CL 109 107 97  CO2 21 21 24   GLUCOSE 131* 158* 136*  BUN 19 20 19   CREATININE 1.63* 1.85* 1.97*  CALCIUM 7.7* 7.1* 8.6   LFT  Basename 01/21/12 0620 01/20/12 1145 01/20/12 0500 01/19/12 2114  PROT 5.6* -- 5.0* 6.0  ALBUMIN 2.7* -- 2.7* 3.3*  AST 73* -- 147* 219*  ALT 158* -- 221* 291*  ALKPHOS 107 -- 103 147*  BILITOT 4.0* 6.1* 6.1* --  BILIDIR -- 4.2* -- --  IBILI -- 1.9* -- --   PT/INR  Basename 01/19/12 2333  LABPROT 17.6*  INR 1.42   Hepatitis Panel  Basename 01/19/12 2333  HEPBSAG NEGATIVE  HCVAB NEGATIVE  HEPAIGM NEGATIVE  HEPBIGM POSITIVE*    ASSESMENT: * Acute cholestatic jaundice .  ? reccurent bile duct stone, ? Allopurinol induced liver injury?, ? Systemic disease associated hypotension and ischemia to liver?Marland Kitchen LFTs steadily improving.  Non-specific right sided  low attenuation on liver.  Worked up at [facility] for positive hepatitis B, determined to be a false positive. Currently the Hep B IgM is positive. Hep B surface Ag is negative.  * Pneumobilia. Normal post sphincterotomy finding.  * SIRS, mild.  * Thrombocytopenia.  * Acute anemia, suspect dilutional effect is at play. He received 4.5 liters on 7/29, one liter on 7/30. Fluids currently at 50 ml/hour. Baseline hgb is 13.4 in 09/2011.  HH  stable  * ARF. Likely from dehydration. Baseline creat 1.3 in 09/2011. Diuretics on hold. Creat improved.    PLAN: *  MRCP ordered, needs 4 hours NPO.    LOS: 2 days   Jennye Moccasin  01/21/2012, 11:05 AM Pager: [phone]   ________________________________________________________________________  [facility] MD note:  I personally examined the patient, reviewed the data and agree with the assessment and plan described above.  LFTs improving but I'm still suspicious for biliary tract issue. Agree with MRCP.   Rob Bunting, MD [facility] Pager [phone]

## 2012-01-21 NOTE — Progress Notes (Signed)
[facility] PROGRESS NOTE  Jesse Hoffman ZOX:[id number] DOB: Feb 04, 1936 DOA: 01/19/2012 PCP: Georgianne Fick, MD  Assessment/Plan: Active Problems:  SIRS (systemic inflammatory response syndrome)  Dehydration  Abdominal pain  Jaundice  Elevated liver enzymes  Acute renal failure  1. Acute cholestatic jaundice, concern for CBD stone, MRCP today, possibly ERCP if MRCP is abnormal, Hepatitis B C IgM positive, hepatitis B surface antigen negative, appreciate GI input  #2 acute renal failure creatinine steadily improving, secondary to hypovolemia  3. SIRS/sepsis now improved holding antihypertensive medications  4. Thrombocytopenia stable  5. suspected intra-abdominal infection off antibiotics Code Status: full Family Communication: family updated about patient's clinical progress Disposition Plan:  As above         HPI/Subjective: Denies any nausea vomiting abdominal pain  Objective: Filed Vitals:   01/20/12 1240 01/20/12 1824 01/20/12 2134 01/21/12 0510  BP: 125/77 130/71 104/62 118/76  Pulse: 69 84 73 73  Temp: 97.9 F (36.6 C) 98.2 F (36.8 C) 98.8 F (37.1 C) 97.2 F (36.2 C)  TempSrc:   Oral Oral  Resp: 18 20 21 18   Height:      Weight:      SpO2: 99% 96% 98% 96%    Intake/Output Summary (Last 24 hours) at 01/21/12 1224 Last data filed at 01/21/12 0900  Gross per 24 hour  Intake   1040 ml  Output   2775 ml  Net  -1735 ml    Exam:  HENT:  Head: Atraumatic.  Nose: Nose normal.  Mouth/Throat: Oropharynx is clear and moist.  Eyes: Conjunctivae are normal. Pupils are equal, round, and reactive to light. No scleral icterus.  Neck: Neck supple. No tracheal deviation present.  Cardiovascular: Normal rate, regular rhythm, normal heart sounds and intact distal pulses.  Pulmonary/Chest: Effort normal and breath sounds normal. No respiratory distress.  Abdominal: Soft. Normal appearance and bowel sounds are normal. She exhibits no distension.  There is no tenderness.  Musculoskeletal: She exhibits no edema and no tenderness.  Neurological: She is alert. No cranial nerve deficit.    Data Reviewed: Basic Metabolic Panel:  Lab 01/21/12 8119 01/20/12 0500 01/19/12 2114  NA 140 138 135  K 3.5 4.6 3.5  CL 109 107 97  CO2 21 21 24   GLUCOSE 131* 158* 136*  BUN 19 20 19   CREATININE 1.63* 1.85* 1.97*  CALCIUM 7.7* 7.1* 8.6  MG -- -- --  PHOS -- -- --    Liver Function Tests:  Lab 01/21/12 0620 01/20/12 1145 01/20/12 0500 01/19/12 2114  AST 73* -- 147* 219*  ALT 158* -- 221* 291*  ALKPHOS 107 -- 103 147*  BILITOT 4.0* 6.1* 6.1* 6.8*  PROT 5.6* -- 5.0* 6.0  ALBUMIN 2.7* -- 2.7* 3.3*    Lab 01/20/12 0022 01/19/12 2114  LIPASE -- 43  AMYLASE 46 --   No results found for this basename: AMMONIA:5 in the last 168 hours  CBC:  Lab 01/21/12 0620 01/20/12 0500 01/19/12 2114  WBC 8.4 11.4* 9.0  NEUTROABS -- -- 8.1*  HGB 10.4* 10.4* 12.0*  HCT 29.6* 29.5* 33.9*  MCV 88.6 89.1 88.1  PLT 100* 87* 114*    Cardiac Enzymes:  Lab 01/20/12 1148 01/20/12 0424 01/19/12 2333  CKTOTAL 99 82 73  CKMB 2.3 1.7 1.4  CKMBINDEX -- -- --  TROPONINI <0.30 <0.30 <0.30   BNP (last 3 results) No results found for this basename: PROBNP:3 in the last 8760 hours   CBG:  Lab 01/20/12 0817  GLUCAP 128*  Recent Results (from the past 240 hour(s))  CULTURE, BLOOD (ROUTINE X 2)     Status: Normal (Preliminary result)   Collection Time   01/19/12  8:55 PM      Component Value Range Status Comment   Specimen Description BLOOD ARM RIGHT   Final    Special Requests BOTTLES DRAWN AEROBIC AND ANAEROBIC 10CC   Final    Culture  Setup Time 01/20/2012 03:57   Final    Culture     Final    Value:        BLOOD CULTURE RECEIVED NO GROWTH TO DATE CULTURE WILL BE HELD FOR 5 DAYS BEFORE ISSUING A FINAL NEGATIVE REPORT   Report Status PENDING   Incomplete   CULTURE, BLOOD (ROUTINE X 2)     Status: Normal (Preliminary result)   Collection Time     01/19/12  9:07 PM      Component Value Range Status Comment   Specimen Description BLOOD HAND RIGHT   Final    Special Requests BOTTLES DRAWN AEROBIC ONLY 9CC   Final    Culture  Setup Time 01/20/2012 03:58   Final    Culture     Final    Value:        BLOOD CULTURE RECEIVED NO GROWTH TO DATE CULTURE WILL BE HELD FOR 5 DAYS BEFORE ISSUING A FINAL NEGATIVE REPORT   Report Status PENDING   Incomplete   MRSA PCR SCREENING     Status: Normal   Collection Time   01/20/12  1:41 AM      Component Value Range Status Comment   MRSA by PCR NEGATIVE  NEGATIVE Final   URINE CULTURE     Status: Normal   Collection Time   01/20/12  2:45 AM      Component Value Range Status Comment   Specimen Description URINE, RANDOM   Final    Special Requests NONE   Final    Culture  Setup Time 01/20/2012 03:33   Final    Colony Count NO GROWTH   Final    Culture NO GROWTH   Final    Report Status 01/21/2012 FINAL   Final      Studies: Ct Abdomen Pelvis Wo Contrast  01/20/2012  *RADIOLOGY REPORT*  Clinical Data: Suspected abdominal sepsis.  CT ABDOMEN AND PELVIS WITHOUT CONTRAST  Technique:  Multidetector CT imaging of the abdomen and pelvis was performed following the standard protocol without intravenous contrast.  Comparison: 01/19/2012 ultrasound  Findings: Linear lung base opacities.  Coronary artery and aortic valve calcifications.  Organ abnormality/lesion detection is limited in the absence of intravenous contrast. Within this limitation, intra and extrahepatic pneumobilia.  Absent gallbladder.  No focal abnormality within the spleen, pancreas, adrenal glands. There is a linear area of low attenuation within the right hepatic lobe. Correlate clinically with any history of procedure such as transhepatic biliary drain placement.  Nonspecific bilateral perinephric fat stranding.  There are at least two were cysts on the right.  An upper pole lesion on the left is occluded characterize by CT though favored to  reflect a hemorrhagic or proteinaceous cyst on recent ultrasound.  No hydronephrosis or hydroureter.  No bowel obstruction. Small duodenal diverticulum.  Colonic diverticulosis without CT evidence for diverticulitis.  Normal appendix.  No free intraperitoneal air.  Small amount of free fluid.  No lymphadenopathy.  There is scattered atherosclerotic calcification of the aorta and its branches. No aneurysmal dilatation.  Prostatomegaly.  Circumferential bladder wall thickening.  Multilevel degenerative changes of the imaged spine. No acute or aggressive appearing osseous lesion.  IMPRESSION: Pneumobilia is nonspecific.  Correlate with recent procedure/sphincterotomy.  Linear area of low attenuation within the right hepatic lobe. Correlate clinically with any history of procedure such as transhepatic biliary drain placement.  I cannot confirm patency of the adjacent intrahepatic portal vein.  Consider contrast enhanced examination or liver MRI.  Nonspecific perinephric fat stranding.  Correlate with urinalysis if concerned for ascending infection.  Small amount of free fluid within the pelvis.  Original Report Authenticated By: Waneta Martins, M.D.   Dg Chest 2 View  01/19/2012  *RADIOLOGY REPORT*  Clinical Data: Fever, stomach pain  CHEST - 2 VIEW  Comparison: 06/28/2004  Findings: Grossly unchanged cardiac silhouette and mediastinal contours. Minimal left basilar linear heterogeneous opacities favored to represent subsegmental atelectasis or scar.  No focal airspace opacities.  No definite pleural effusion or pneumothorax. No acute osseous abnormalities.  IMPRESSION: Minimal left basilar atelectasis/scar without acute cardiopulmonary disease.  Original Report Authenticated By: Waynard Reeds, M.D.   US Abdomen Complete  01/19/2012  *RADIOLOGY REPORT*  Clinical Data:  Elevated LFTs.  Status post cholecystectomy and placement of biliary stent.  COMPLETE ABDOMINAL ULTRASOUND  Comparison:  Ultrasound  02/25/2008  Findings:  Gallbladder:  The patient is status post cholecystectomy.  Common bile duct:  Common bile duct is 5.8 mm.  There is air within the intrahepatic biliary ducts, consistent with history of cholecystectomy and stent placement.  Liver:  Homogeneous without focal lesion.  IVC:  Appears normal.  Pancreas:  The pancreas is not well seen because of overlying bowel gas.  Spleen:  The spleen is normal in appearance, 13.7 cm in length.  Right Kidney:  12.0 cm in length.  Small cyst in the midpole region is 2.3 cm.  Left Kidney:  13.0 cm in length.  Upper pole cyst is 1.3 cm.  Abdominal aorta:  Visualized portion of the abdominal aorta is not aneurysmal.  Distal aspect is not well seen because of overlying bowel gas.  IMPRESSION:  1.  Status post cholecystectomy. 2.  Pneumobilia, consistent with previous surgery. 3.  No evidence for hydronephrosis. 4.  Bilateral small renal cysts. 5.  No focal liver lesion.  Original Report Authenticated By: Patterson Hammersmith, M.D.   Dg Foot Complete Left  12/25/2011  *RADIOLOGY REPORT*  Clinical Data: Blow to the first toe.  Pain.  LEFT FOOT - COMPLETE 3+ VIEW  Comparison: None.  Findings: Imaged bones, joints and soft tissues appear normal.  IMPRESSION: Negative study.  Clinically significant discrepancy from primary report, if provided: None  Original Report Authenticated By: Bernadene Bell. D'ALESSIO, M.D.    Scheduled Meds:   . antiseptic oral rinse  15 mL Mouth Rinse q12n4p  . chlorhexidine  15 mL Mouth Rinse BID  . pantoprazole (PROTONIX) IV  40 mg Intravenous QHS  . piperacillin-tazobactam (ZOSYN)  IV  3.375 g Intravenous Q8H   Continuous Infusions:   . sodium chloride 50 mL/hr at 01/21/12 1024    Active Problems:  SIRS (systemic inflammatory response syndrome)  Dehydration  Abdominal pain  Jaundice  Elevated liver enzymes  Acute renal failure    Time spent: 40 minutes   [facility]  [facility] Pager [phone]. If 8PM-8AM, please  contact night-coverage at [email or web address], password [facility] 01/21/2012, 12:24 PM  LOS: 2 days

## 2012-01-21 NOTE — Clinical Documentation Improvement (Signed)
    SHOCK DOCUMENTATION CLARIFICATION QUERY  THIS DOCUMENT IS NOT A PERMANENT PART OF THE MEDICAL RECORD  TO RESPOND TO THE THIS QUERY, FOLLOW THE INSTRUCTIONS BELOW:  1. If needed, update documentation for the patient's encounter via the notes activity.  2. Access this query again and click edit on the In [employer].  3. After updating, or not, click F2 to complete all highlighted (required) fields concerning your review. Select "additional documentation in the medical record" OR "no additional documentation provided".  4. Click Sign note button.  5. The deficiency will fall out of your In Basket *Please let us know if you are not able to complete this workflow by phone or e-mail (listed below).  Please update your documentation within the medical record to reflect your response to this query.                                                                                        01/21/12   Dear Dr. [facility]  In a better effort to capture your patient's severity of illness, reflect appropriate length of stay and utilization of resources, a review of the patient medical record has revealed the following indicators.    Based on your clinical judgment, please clarify and document in a progress note and/or discharge summary the clinical condition associated with the following supporting information:  In responding to this query please exercise your independent judgment.  The fact that a query is asked, does not imply that any particular answer is desired or expected.   You may use possible, probable, or suspect with inpatient documentation. possible, probable, suspected diagnoses MUST be documented at the time of discharge   Possible Clinical Conditions  Hypovolemic Shock  Other Condition  Cannot Clinically determine   Supporting Information: "Hypovolemia (decreased intake, diuretics and increased insensible losses)" per notes "Likely from dehydration' per  notes ARF Sepsis  Signs & Symptoms:  "Admitted with fevers, hypotension and jaundice" per notes "Pt is likely septic with hypotension, tachycardia, elevated LFT's and likely source of infection being in biliary tree" per EDP notes "Pt is now on 3rd liter of IVF's. HR is down to 70's, however BP remains low in the lower 80 systolic range" per EDP notes  Diagnostics: Results for JEMEL, ONO (MRN [id number]) as of 01/21/2012 12:17  Ref. Range 01/19/2012 21:14 01/20/2012 05:00  Creat Latest Range: 0.50-1.35 mg/dL 4.09 (H) 8.11 (H)   Results for DARTANION, TEO (MRN [id number]) as of 01/21/2012 12:17  Ref. Range 01/19/2012 21:14  Procalcitonin No range found 29.74   Results for JAYVIN, HURRELL (MRN [id number]) as of 01/21/2012 12:17  Ref. Range 01/19/2012 21:14  Lactic Acid, Venous Latest Range: 0.5-2.2 mmol/L 2.8 (H)   Treatment: "He received 4.5 liters on 7/29, one liter on 7/30.  Fluids currently at 50 ml/hour" per notes Admission to ICU Zosyn 3.375 mg IVPB every 8hrs Monitoring of labs, I&O and skin assessment   Reviewed: Query not answered  Thank You,    Rossie Muskrat RN, BSN  Clinical Documentation Specialist Pager:  [phone] [email or web address]  Health Information Management [place]

## 2012-01-22 ENCOUNTER — Telehealth: Payer: Self-pay

## 2012-01-22 DIAGNOSIS — R17 Unspecified jaundice: Secondary | ICD-10-CM

## 2012-01-22 LAB — COMPREHENSIVE METABOLIC PANEL
ALT: 114 U/L — ABNORMAL HIGH (ref 0–53)
AST: 36 U/L (ref 0–37)
Albumin: 2.6 g/dL — ABNORMAL LOW (ref 3.5–5.2)
Alkaline Phosphatase: 116 U/L (ref 39–117)
Calcium: 8.1 mg/dL — ABNORMAL LOW (ref 8.4–10.5)
GFR calc Af Amer: 61 mL/min — ABNORMAL LOW (ref >90)
Glucose, Bld: 114 mg/dL — ABNORMAL HIGH (ref 70–99)
Potassium: 3.6 mEq/L (ref 3.5–5.1)
Sodium: 139 mEq/L (ref 135–145)
Total Protein: 5.8 g/dL — ABNORMAL LOW (ref 6.0–8.3)

## 2012-01-22 MED ORDER — AMOXICILLIN-POT CLAVULANATE 875-125 MG PO TABS
1.0000 | ORAL_TABLET | Freq: Two times a day (BID) | ORAL | Status: DC
  Administered 2012-01-22 (×2): 1 via ORAL
  Filled 2012-01-22 (×4): qty 1

## 2012-01-22 NOTE — Progress Notes (Signed)
[facility] PROGRESS NOTE  Jesse Hoffman ZOX:[id number] DOB: 01/11/1936 DOA: 01/19/2012 PCP: Georgianne Fick, MD  Assessment/Plan: Active Problems:  SIRS (systemic inflammatory response syndrome)  Dehydration  Abdominal pain  Jaundice  Elevated liver enzymes  Acute renal failure  1. Acute cholestatic jaundice, concern for CBD stone, MRCP yesterday shows no sign of CBD stones Hepatitis B C IgM positive, hepatitis B surface antigen negative, appreciate GI input , change to Augmentin and DC Zosyn #2 acute renal failure creatinine steadily improving, secondary to hypovolemia  3. SIRS/sepsis now improved holding antihypertensive medications  4. Thrombocytopenia stable  5. suspected intra-abdominal infection off antibiotics   Code Status: full Family Communication: family updated about patient's clinical progress Disposition Plan:  As above         HPI/Subjective: Feeling better, no complaints of nausea vomiting abdominal pain  Objective: Filed Vitals:   01/21/12 0510 01/21/12 1452 01/21/12 2200 01/22/12 0600  BP: 118/76 121/82 122/56 118/67  Pulse: 73 76 78 74  Temp: 97.2 F (36.2 C) 97.7 F (36.5 C) 98.4 F (36.9 C) 98.5 F (36.9 C)  TempSrc: Oral Oral    Resp: 18 16 19 20   Height:      Weight:      SpO2: 96% 96% 98% 96%    Intake/Output Summary (Last 24 hours) at 01/22/12 1324 Last data filed at 01/22/12 1100  Gross per 24 hour  Intake 2245.83 ml  Output   2900 ml  Net -654.17 ml    Exam:  HENT:  Head: Atraumatic.  Nose: Nose normal.  Mouth/Throat: Oropharynx is clear and moist.  Eyes: Conjunctivae are normal. Pupils are equal, round, and reactive to light. No scleral icterus.  Neck: Neck supple. No tracheal deviation present.  Cardiovascular: Normal rate, regular rhythm, normal heart sounds and intact distal pulses.  Pulmonary/Chest: Effort normal and breath sounds normal. No respiratory distress.  Abdominal: Soft. Normal appearance and  bowel sounds are normal. She exhibits no distension. There is no tenderness.  Musculoskeletal: She exhibits no edema and no tenderness.  Neurological: She is alert. No cranial nerve deficit.    Data Reviewed: Basic Metabolic Panel:  Lab 01/22/12 8119 01/21/12 0620 01/20/12 0500 01/19/12 2114  NA 139 140 138 135  K 3.6 3.5 4.6 3.5  CL 106 109 107 97  CO2 25 21 21 24   GLUCOSE 114* 131* 158* 136*  BUN 13 19 20 19   CREATININE 1.30 1.63* 1.85* 1.97*  CALCIUM 8.1* 7.7* 7.1* 8.6  MG -- -- -- --  PHOS -- -- -- --    Liver Function Tests:  Lab 01/22/12 0627 01/21/12 0620 01/20/12 1145 01/20/12 0500 01/19/12 2114  AST 36 73* -- 147* 219*  ALT 114* 158* -- 221* 291*  ALKPHOS 116 107 -- 103 147*  BILITOT 2.6* 4.0* 6.1* 6.1* 6.8*  PROT 5.8* 5.6* -- 5.0* 6.0  ALBUMIN 2.6* 2.7* -- 2.7* 3.3*    Lab 01/20/12 0022 01/19/12 2114  LIPASE -- 43  AMYLASE 46 --   No results found for this basename: AMMONIA:5 in the last 168 hours  CBC:  Lab 01/21/12 0620 01/20/12 0500 01/19/12 2114  WBC 8.4 11.4* 9.0  NEUTROABS -- -- 8.1*  HGB 10.4* 10.4* 12.0*  HCT 29.6* 29.5* 33.9*  MCV 88.6 89.1 88.1  PLT 100* 87* 114*    Cardiac Enzymes:  Lab 01/20/12 1148 01/20/12 0424 01/19/12 2333  CKTOTAL 99 82 73  CKMB 2.3 1.7 1.4  CKMBINDEX -- -- --  TROPONINI <0.30 <0.30 <0.30  BNP (last 3 results) No results found for this basename: PROBNP:3 in the last 8760 hours   CBG:  Lab 01/20/12 0817  GLUCAP 128*    Recent Results (from the past 240 hour(s))  CULTURE, BLOOD (ROUTINE X 2)     Status: Normal (Preliminary result)   Collection Time   01/19/12  8:55 PM      Component Value Range Status Comment   Specimen Description BLOOD ARM RIGHT   Final    Special Requests BOTTLES DRAWN AEROBIC AND ANAEROBIC 10CC   Final    Culture  Setup Time 01/20/2012 03:57   Final    Culture     Final    Value:        BLOOD CULTURE RECEIVED NO GROWTH TO DATE CULTURE WILL BE HELD FOR 5 DAYS BEFORE ISSUING A  FINAL NEGATIVE REPORT   Report Status PENDING   Incomplete   CULTURE, BLOOD (ROUTINE X 2)     Status: Normal (Preliminary result)   Collection Time   01/19/12  9:07 PM      Component Value Range Status Comment   Specimen Description BLOOD HAND RIGHT   Final    Special Requests BOTTLES DRAWN AEROBIC ONLY 9CC   Final    Culture  Setup Time 01/20/2012 03:58   Final    Culture     Final    Value:        BLOOD CULTURE RECEIVED NO GROWTH TO DATE CULTURE WILL BE HELD FOR 5 DAYS BEFORE ISSUING A FINAL NEGATIVE REPORT   Report Status PENDING   Incomplete   MRSA PCR SCREENING     Status: Normal   Collection Time   01/20/12  1:41 AM      Component Value Range Status Comment   MRSA by PCR NEGATIVE  NEGATIVE Final   URINE CULTURE     Status: Normal   Collection Time   01/20/12  2:45 AM      Component Value Range Status Comment   Specimen Description URINE, RANDOM   Final    Special Requests NONE   Final    Culture  Setup Time 01/20/2012 03:33   Final    Colony Count NO GROWTH   Final    Culture NO GROWTH   Final    Report Status 01/21/2012 FINAL   Final      Studies: Ct Abdomen Pelvis Wo Contrast  01/20/2012  *RADIOLOGY REPORT*  Clinical Data: Suspected abdominal sepsis.  CT ABDOMEN AND PELVIS WITHOUT CONTRAST  Technique:  Multidetector CT imaging of the abdomen and pelvis was performed following the standard protocol without intravenous contrast.  Comparison: 01/19/2012 ultrasound  Findings: Linear lung base opacities.  Coronary artery and aortic valve calcifications.  Organ abnormality/lesion detection is limited in the absence of intravenous contrast. Within this limitation, intra and extrahepatic pneumobilia.  Absent gallbladder.  No focal abnormality within the spleen, pancreas, adrenal glands. There is a linear area of low attenuation within the right hepatic lobe. Correlate clinically with any history of procedure such as transhepatic biliary drain placement.  Nonspecific bilateral perinephric  fat stranding.  There are at least two were cysts on the right.  An upper pole lesion on the left is occluded characterize by CT though favored to reflect a hemorrhagic or proteinaceous cyst on recent ultrasound.  No hydronephrosis or hydroureter.  No bowel obstruction. Small duodenal diverticulum.  Colonic diverticulosis without CT evidence for diverticulitis.  Normal appendix.  No free intraperitoneal air.  Small amount  of free fluid.  No lymphadenopathy.  There is scattered atherosclerotic calcification of the aorta and its branches. No aneurysmal dilatation.  Prostatomegaly.  Circumferential bladder wall thickening.  Multilevel degenerative changes of the imaged spine. No acute or aggressive appearing osseous lesion.  IMPRESSION: Pneumobilia is nonspecific.  Correlate with recent procedure/sphincterotomy.  Linear area of low attenuation within the right hepatic lobe. Correlate clinically with any history of procedure such as transhepatic biliary drain placement.  I cannot confirm patency of the adjacent intrahepatic portal vein.  Consider contrast enhanced examination or liver MRI.  Nonspecific perinephric fat stranding.  Correlate with urinalysis if concerned for ascending infection.  Small amount of free fluid within the pelvis.  Original Report Authenticated By: Waneta Martins, M.D.   Dg Chest 2 View  01/19/2012  *RADIOLOGY REPORT*  Clinical Data: Fever, stomach pain  CHEST - 2 VIEW  Comparison: 06/28/2004  Findings: Grossly unchanged cardiac silhouette and mediastinal contours. Minimal left basilar linear heterogeneous opacities favored to represent subsegmental atelectasis or scar.  No focal airspace opacities.  No definite pleural effusion or pneumothorax. No acute osseous abnormalities.  IMPRESSION: Minimal left basilar atelectasis/scar without acute cardiopulmonary disease.  Original Report Authenticated By: Waynard Reeds, M.D.   US Abdomen Complete  01/19/2012  *RADIOLOGY REPORT*  Clinical  Data:  Elevated LFTs.  Status post cholecystectomy and placement of biliary stent.  COMPLETE ABDOMINAL ULTRASOUND  Comparison:  Ultrasound 02/25/2008  Findings:  Gallbladder:  The patient is status post cholecystectomy.  Common bile duct:  Common bile duct is 5.8 mm.  There is air within the intrahepatic biliary ducts, consistent with history of cholecystectomy and stent placement.  Liver:  Homogeneous without focal lesion.  IVC:  Appears normal.  Pancreas:  The pancreas is not well seen because of overlying bowel gas.  Spleen:  The spleen is normal in appearance, 13.7 cm in length.  Right Kidney:  12.0 cm in length.  Small cyst in the midpole region is 2.3 cm.  Left Kidney:  13.0 cm in length.  Upper pole cyst is 1.3 cm.  Abdominal aorta:  Visualized portion of the abdominal aorta is not aneurysmal.  Distal aspect is not well seen because of overlying bowel gas.  IMPRESSION:  1.  Status post cholecystectomy. 2.  Pneumobilia, consistent with previous surgery. 3.  No evidence for hydronephrosis. 4.  Bilateral small renal cysts. 5.  No focal liver lesion.  Original Report Authenticated By: Patterson Hammersmith, M.D.   Dg Foot Complete Left  12/25/2011  *RADIOLOGY REPORT*  Clinical Data: Blow to the first toe.  Pain.  LEFT FOOT - COMPLETE 3+ VIEW  Comparison: None.  Findings: Imaged bones, joints and soft tissues appear normal.  IMPRESSION: Negative study.  Clinically significant discrepancy from primary report, if provided: None  Original Report Authenticated By: Bernadene Bell. Maricela Curet, M.D.   Mr Abd W/wo Cm/mrcp  01/21/2012  *RADIOLOGY REPORT*  Clinical Data:  Abnormal LFTs, prior ERCP, evaluate bile ducts  MRI ABDOMEN WITHOUT AND WITH CONTRAST (INCLUDING MRCP)  Technique:  Multiplanar multisequence MR imaging of the abdomen was performed both before and after the administration of intravenous contrast. Heavily T2-weighted images of the biliary and pancreatic ducts were obtained, and three-dimensional MRCP images  were rendered by post processing.  Contrast: 10mL MULTIHANCE GADOBENATE DIMEGLUMINE 529 MG/ML IV SOLN  Comparison:  CT abdomen pelvis dated 01/20/2012  Findings:  Small bilateral pleural effusions, new.  No hepatic steatosis.  No suspicious/enhancing hepatic lesions.  Spleen, pancreas, and  adrenal glands are within normal limits.  Status post cholecystectomy. Susceptibility artifact related to pneumobilia, likely reflecting prior sphincterotomy.  No intrahepatic or extrahepatic ductal dilatation.  Common duct measures up to 7.5 mm.  No choledocholithiasis is seen.  2.1 cm right upper pole cyst.  Additional small bilateral renal cysts.  No hydronephrosis.  Small upper abdominal lymph nodes measuring up to 12 mm short axis (series 5/image 21).  No abdominal ascites.  Portal vein is patent.  No suspicious osseous lesions.  IMPRESSION: Status post cholecystectomy.  No intrahepatic or extrahepatic ductal dilatation.  No choledocholithiasis is seen.  Stability artifact related to pneumobilia, likely reflecting prior sphincterotomy.  No suspicious/enhancing hepatic lesions.  No hepatic steatosis.  Small bilateral pleural effusions, new.  Original Report Authenticated By: Charline Bills, M.D.    Scheduled Meds:   . amoxicillin-clavulanate  1 tablet Oral Q12H  . antiseptic oral rinse  15 mL Mouth Rinse q12n4p  . chlorhexidine  15 mL Mouth Rinse BID  . gadobenate dimeglumine  10 mL Intravenous Once  . pantoprazole (PROTONIX) IV  40 mg Intravenous QHS  . DISCONTD: piperacillin-tazobactam (ZOSYN)  IV  3.375 g Intravenous Q8H   Continuous Infusions:   . sodium chloride 50 mL/hr at 01/22/12 9604    Active Problems:  SIRS (systemic inflammatory response syndrome)  Dehydration  Abdominal pain  Jaundice  Elevated liver enzymes  Acute renal failure    Time spent: 40 minutes   [facility]  [facility] Pager [phone]. If 8PM-8AM, please contact night-coverage at [email or web address], password  [facility] 01/22/2012, 1:24 PM  LOS: 3 days

## 2012-01-22 NOTE — Progress Notes (Signed)
[facility] Progress Note    Since last GI note: MRCP yesterday shows no sign of CBD stones.  He is sitting up at desk, eating breakfast without n/v/abd pains.  Ambulating well   Objective: Vital signs in last 24 hours: Temp:  [97.7 F (36.5 C)-98.5 F (36.9 C)] 98.5 F (36.9 C) (08/01 0600) Pulse Rate:  [74-78] 74  (08/01 0600) Resp:  [16-20] 20  (08/01 0600) BP: (118-122)/(56-82) 118/67 mmHg (08/01 0600) SpO2:  [96 %-98 %] 96 % (08/01 0600) Last BM Date: 01/20/12 General: alert and oriented times 3 Heart: regular rate and rythm Abdomen: soft, non-tender, non-distended, normal bowel sounds   Lab Results:  Basename 01/21/12 0620 01/20/12 0500 01/19/12 2114  WBC 8.4 11.4* 9.0  HGB 10.4* 10.4* 12.0*  PLT 100* 87* 114*  MCV 88.6 89.1 88.1    Basename 01/22/12 0627 01/21/12 0620 01/20/12 0500  NA 139 140 138  K 3.6 3.5 4.6  CL 106 109 107  CO2 25 21 21   GLUCOSE 114* 131* 158*  BUN 13 19 20   CREATININE 1.30 1.63* 1.85*  CALCIUM 8.1* 7.7* 7.1*    Basename 01/22/12 0627 01/21/12 0620 01/20/12 1145 01/20/12 0500  PROT 5.8* 5.6* -- 5.0*  ALBUMIN 2.6* 2.7* -- 2.7*  AST 36 73* -- 147*  ALT 114* 158* -- 221*  ALKPHOS 116 107 -- 103  BILITOT 2.6* 4.0* 6.1* --  BILIDIR -- -- 4.2* --  IBILI -- -- 1.9* --    Basename 01/19/12 2333  INR 1.42     Studies/Results: Mr Abd W/wo Cm/mrcp  01/21/2012  *RADIOLOGY REPORT*  Clinical Data:  Abnormal LFTs, prior ERCP, evaluate bile ducts  MRI ABDOMEN WITHOUT AND WITH CONTRAST (INCLUDING MRCP)  Technique:  Multiplanar multisequence MR imaging of the abdomen was performed both before and after the administration of intravenous contrast. Heavily T2-weighted images of the biliary and pancreatic ducts were obtained, and three-dimensional MRCP images were rendered by post processing.  Contrast: 10mL MULTIHANCE GADOBENATE DIMEGLUMINE 529 MG/ML IV SOLN  Comparison:  CT abdomen pelvis dated 01/20/2012  Findings:  Small  bilateral pleural effusions, new.  No hepatic steatosis.  No suspicious/enhancing hepatic lesions.  Spleen, pancreas, and adrenal glands are within normal limits.  Status post cholecystectomy. Susceptibility artifact related to pneumobilia, likely reflecting prior sphincterotomy.  No intrahepatic or extrahepatic ductal dilatation.  Common duct measures up to 7.5 mm.  No choledocholithiasis is seen.  2.1 cm right upper pole cyst.  Additional small bilateral renal cysts.  No hydronephrosis.  Small upper abdominal lymph nodes measuring up to 12 mm short axis (series 5/image 21).  No abdominal ascites.  Portal vein is patent.  No suspicious osseous lesions.  IMPRESSION: Status post cholecystectomy.  No intrahepatic or extrahepatic ductal dilatation.  No choledocholithiasis is seen.  Stability artifact related to pneumobilia, likely reflecting prior sphincterotomy.  No suspicious/enhancing hepatic lesions.  No hepatic steatosis.  Small bilateral pleural effusions, new.  Original Report Authenticated By: Charline Bills, M.D.     Medications: Scheduled Meds:   . antiseptic oral rinse  15 mL Mouth Rinse q12n4p  . chlorhexidine  15 mL Mouth Rinse BID  . gadobenate dimeglumine  10 mL Intravenous Once  . pantoprazole (PROTONIX) IV  40 mg Intravenous QHS  . piperacillin-tazobactam (ZOSYN)  IV  3.375 g Intravenous Q8H   Continuous Infusions:   . sodium chloride 50 mL/hr at 01/22/12 0644   PRN Meds:.oxyCODONE    Assessment/Plan: 76 y.o. male with probable drug reaction  HE  started allopurinal 2 weeks prior to his symptoms and it is listed to cause hepatotoxicity, LFT elevation. MRCP showed no biliary disease.  His numbers are improving with time, also abx were started.  I think changing him to PO abx today is reasonable and as long as he does well another 24 hours, he can go home tomorrow with another 5 days of po abx (augmentin).  I don't think he's had a bacterial infection but should continue full  course to be safe.  My office will get in touch with him about follow up appt to recheck lfts, hear how he's doing in 2-3 weeks.   I am adding allopurinal to his drug allergy list.    Rob Bunting, MD  01/22/2012, 8:28 AM [facility] Pager [phone]

## 2012-01-22 NOTE — Telephone Encounter (Signed)
Letter mailed to the pt with recommendation to have labs the day prior to ROV that is scheduled

## 2012-01-22 NOTE — Telephone Encounter (Signed)
Message copied by Donata Duff on Thu Jan 22, 2012  8:59 AM ------      Message from: Rob Bunting P      Created: Thu Jan 22, 2012  8:52 AM       He needs rov with me in 3-4 weeks, lfts the day prior.  Thanks            Leaving hospital tomorrow sometime.

## 2012-01-23 LAB — COMPREHENSIVE METABOLIC PANEL
AST: 30 U/L (ref 0–37)
CO2: 25 mEq/L (ref 19–32)
Calcium: 8.2 mg/dL — ABNORMAL LOW (ref 8.4–10.5)
Chloride: 106 mEq/L (ref 96–112)
Creatinine, Ser: 1.27 mg/dL (ref 0.50–1.35)
GFR calc Af Amer: 63 mL/min — ABNORMAL LOW (ref >90)
GFR calc non Af Amer: 54 mL/min — ABNORMAL LOW (ref >90)
Glucose, Bld: 104 mg/dL — ABNORMAL HIGH (ref 70–99)
Total Bilirubin: 2 mg/dL — ABNORMAL HIGH (ref 0.3–1.2)

## 2012-01-23 MED ORDER — AMOXICILLIN-POT CLAVULANATE 875-125 MG PO TABS: 1.0000 | ORAL_TABLET | Freq: Two times a day (BID) | ORAL | Status: AC

## 2012-01-23 NOTE — Discharge Summary (Signed)
Physician Discharge Summary  CHARLEY MISKE MRN: [id number] DOB/AGE: [birth year] 76 y.o.  PCP: Georgianne Fick, MD   Admit date: 01/19/2012 Discharge date: 01/23/2012  Discharge Diagnoses:     SIRS (systemic inflammatory response syndrome)  Dehydration  Abdominal pain  Jaundice  Elevated liver enzymes  Acute renal failure   Medication List  As of 01/23/2012 11:14 AM   TAKE these medications         amoxicillin-clavulanate 875-125 MG per tablet   Commonly known as: AUGMENTIN   Take 1 tablet by mouth every 12 (twelve) hours.      atorvastatin 20 MG tablet   Commonly known as: LIPITOR   Take 20 mg by mouth daily.      CENTRUM SILVER PO   Take 1 tablet by mouth daily.      cetirizine 10 MG tablet   Commonly known as: ZYRTEC   Take 1 tablet (10 mg total) by mouth daily.      esomeprazole 40 MG capsule   Commonly known as: NEXIUM   Take 40 mg by mouth daily before breakfast.      famotidine 20 MG tablet   Commonly known as: PEPCID   Take 1 tablet (20 mg total) by mouth 2 (two) times daily.      guanFACINE 2 MG tablet   Commonly known as: TENEX   Take 2 mg by mouth at bedtime.      omega-3 acid ethyl esters 1 G capsule   Commonly known as: LOVAZA   Take 1 g by mouth 2 (two) times daily.      pyridOXINE 100 MG tablet   Commonly known as: VITAMIN B-6   Take 100 mg by mouth daily.      triamterene-hydrochlorothiazide 75-50 MG per tablet   Commonly known as: MAXZIDE   Take 1 tablet by mouth daily.            Discharge Condition: Stable  Disposition: 01-Home or Self Care   Consults:  #1 GI   Significant Diagnostic Studies: Ct Abdomen Pelvis Wo Contrast  01/20/2012  *RADIOLOGY REPORT*  Clinical Data: Suspected abdominal sepsis.  CT ABDOMEN AND PELVIS WITHOUT CONTRAST  Technique:  Multidetector CT imaging of the abdomen and pelvis was performed following the standard protocol without intravenous contrast.  Comparison: 01/19/2012 ultrasound   Findings: Linear lung base opacities.  Coronary artery and aortic valve calcifications.  Organ abnormality/lesion detection is limited in the absence of intravenous contrast. Within this limitation, intra and extrahepatic pneumobilia.  Absent gallbladder.  No focal abnormality within the spleen, pancreas, adrenal glands. There is a linear area of low attenuation within the right hepatic lobe. Correlate clinically with any history of procedure such as transhepatic biliary drain placement.  Nonspecific bilateral perinephric fat stranding.  There are at least two were cysts on the right.  An upper pole lesion on the left is occluded characterize by CT though favored to reflect a hemorrhagic or proteinaceous cyst on recent ultrasound.  No hydronephrosis or hydroureter.  No bowel obstruction. Small duodenal diverticulum.  Colonic diverticulosis without CT evidence for diverticulitis.  Normal appendix.  No free intraperitoneal air.  Small amount of free fluid.  No lymphadenopathy.  There is scattered atherosclerotic calcification of the aorta and its branches. No aneurysmal dilatation.  Prostatomegaly.  Circumferential bladder wall thickening.  Multilevel degenerative changes of the imaged spine. No acute or aggressive appearing osseous lesion.  IMPRESSION: Pneumobilia is nonspecific.  Correlate with recent procedure/sphincterotomy.  Linear area of low  attenuation within the right hepatic lobe. Correlate clinically with any history of procedure such as transhepatic biliary drain placement.  I cannot confirm patency of the adjacent intrahepatic portal vein.  Consider contrast enhanced examination or liver MRI.  Nonspecific perinephric fat stranding.  Correlate with urinalysis if concerned for ascending infection.  Small amount of free fluid within the pelvis.  Original Report Authenticated By: Waneta Martins, M.D.   Dg Chest 2 View  01/19/2012  *RADIOLOGY REPORT*  Clinical Data: Fever, stomach pain  CHEST - 2 VIEW   Comparison: 06/28/2004  Findings: Grossly unchanged cardiac silhouette and mediastinal contours. Minimal left basilar linear heterogeneous opacities favored to represent subsegmental atelectasis or scar.  No focal airspace opacities.  No definite pleural effusion or pneumothorax. No acute osseous abnormalities.  IMPRESSION: Minimal left basilar atelectasis/scar without acute cardiopulmonary disease.  Original Report Authenticated By: Waynard Reeds, M.D.   US Abdomen Complete  01/19/2012  *RADIOLOGY REPORT*  Clinical Data:  Elevated LFTs.  Status post cholecystectomy and placement of biliary stent.  COMPLETE ABDOMINAL ULTRASOUND  Comparison:  Ultrasound 02/25/2008  Findings:  Gallbladder:  The patient is status post cholecystectomy.  Common bile duct:  Common bile duct is 5.8 mm.  There is air within the intrahepatic biliary ducts, consistent with history of cholecystectomy and stent placement.  Liver:  Homogeneous without focal lesion.  IVC:  Appears normal.  Pancreas:  The pancreas is not well seen because of overlying bowel gas.  Spleen:  The spleen is normal in appearance, 13.7 cm in length.  Right Kidney:  12.0 cm in length.  Small cyst in the midpole region is 2.3 cm.  Left Kidney:  13.0 cm in length.  Upper pole cyst is 1.3 cm.  Abdominal aorta:  Visualized portion of the abdominal aorta is not aneurysmal.  Distal aspect is not well seen because of overlying bowel gas.  IMPRESSION:  1.  Status post cholecystectomy. 2.  Pneumobilia, consistent with previous surgery. 3.  No evidence for hydronephrosis. 4.  Bilateral small renal cysts. 5.  No focal liver lesion.  Original Report Authenticated By: Patterson Hammersmith, M.D.   Dg Foot Complete Left  12/25/2011  *RADIOLOGY REPORT*  Clinical Data: Blow to the first toe.  Pain.  LEFT FOOT - COMPLETE 3+ VIEW  Comparison: None.  Findings: Imaged bones, joints and soft tissues appear normal.  IMPRESSION: Negative study.  Clinically significant discrepancy from  primary report, if provided: None  Original Report Authenticated By: Bernadene Bell. Maricela Curet, M.D.   Mr Abd W/wo Cm/mrcp  01/21/2012  *RADIOLOGY REPORT*  Clinical Data:  Abnormal LFTs, prior ERCP, evaluate bile ducts  MRI ABDOMEN WITHOUT AND WITH CONTRAST (INCLUDING MRCP)  Technique:  Multiplanar multisequence MR imaging of the abdomen was performed both before and after the administration of intravenous contrast. Heavily T2-weighted images of the biliary and pancreatic ducts were obtained, and three-dimensional MRCP images were rendered by post processing.  Contrast: 10mL MULTIHANCE GADOBENATE DIMEGLUMINE 529 MG/ML IV SOLN  Comparison:  CT abdomen pelvis dated 01/20/2012  Findings:  Small bilateral pleural effusions, new.  No hepatic steatosis.  No suspicious/enhancing hepatic lesions.  Spleen, pancreas, and adrenal glands are within normal limits.  Status post cholecystectomy. Susceptibility artifact related to pneumobilia, likely reflecting prior sphincterotomy.  No intrahepatic or extrahepatic ductal dilatation.  Common duct measures up to 7.5 mm.  No choledocholithiasis is seen.  2.1 cm right upper pole cyst.  Additional small bilateral renal cysts.  No hydronephrosis.  Small upper  abdominal lymph nodes measuring up to 12 mm short axis (series 5/image 21).  No abdominal ascites.  Portal vein is patent.  No suspicious osseous lesions.  IMPRESSION: Status post cholecystectomy.  No intrahepatic or extrahepatic ductal dilatation.  No choledocholithiasis is seen.  Stability artifact related to pneumobilia, likely reflecting prior sphincterotomy.  No suspicious/enhancing hepatic lesions.  No hepatic steatosis.  Small bilateral pleural effusions, new.  Original Report Authenticated By: Charline Bills, M.D.     Microbiology: Recent Results (from the past 240 hour(s))  CULTURE, BLOOD (ROUTINE X 2)     Status: Normal (Preliminary result)   Collection Time   01/19/12  8:55 PM      Component Value Range Status  Comment   Specimen Description BLOOD ARM RIGHT   Final    Special Requests BOTTLES DRAWN AEROBIC AND ANAEROBIC 10CC   Final    Culture  Setup Time 01/20/2012 03:57   Final    Culture     Final    Value:        BLOOD CULTURE RECEIVED NO GROWTH TO DATE CULTURE WILL BE HELD FOR 5 DAYS BEFORE ISSUING A FINAL NEGATIVE REPORT   Report Status PENDING   Incomplete   CULTURE, BLOOD (ROUTINE X 2)     Status: Normal (Preliminary result)   Collection Time   01/19/12  9:07 PM      Component Value Range Status Comment   Specimen Description BLOOD HAND RIGHT   Final    Special Requests BOTTLES DRAWN AEROBIC ONLY 9CC   Final    Culture  Setup Time 01/20/2012 03:58   Final    Culture     Final    Value:        BLOOD CULTURE RECEIVED NO GROWTH TO DATE CULTURE WILL BE HELD FOR 5 DAYS BEFORE ISSUING A FINAL NEGATIVE REPORT   Report Status PENDING   Incomplete   MRSA PCR SCREENING     Status: Normal   Collection Time   01/20/12  1:41 AM      Component Value Range Status Comment   MRSA by PCR NEGATIVE  NEGATIVE Final   URINE CULTURE     Status: Normal   Collection Time   01/20/12  2:45 AM      Component Value Range Status Comment   Specimen Description URINE, RANDOM   Final    Special Requests NONE   Final    Culture  Setup Time 01/20/2012 03:33   Final    Colony Count NO GROWTH   Final    Culture NO GROWTH   Final    Report Status 01/21/2012 FINAL   Final      Labs: Results for orders placed during the hospital encounter of 01/19/12 (from the past 48 hour(s))  COMPREHENSIVE METABOLIC PANEL     Status: Abnormal   Collection Time   01/22/12  6:27 AM      Component Value Range Comment   Sodium 139  135 - 145 mEq/L    Potassium 3.6  3.5 - 5.1 mEq/L    Chloride 106  96 - 112 mEq/L    CO2 25  19 - 32 mEq/L    Glucose, Bld 114 (*) 70 - 99 mg/dL    BUN 13  6 - 23 mg/dL    Creatinine, Ser 1.61  0.50 - 1.35 mg/dL    Calcium 8.1 (*) 8.4 - 10.5 mg/dL    Total Protein 5.8 (*) 6.0 - 8.3 g/dL  Albumin 2.6  (*) 3.5 - 5.2 g/dL    AST 36  0 - 37 U/L    ALT 114 (*) 0 - 53 U/L    Alkaline Phosphatase 116  39 - 117 U/L    Total Bilirubin 2.6 (*) 0.3 - 1.2 mg/dL    GFR calc non Af Amer 53 (*) >90 mL/min    GFR calc Af Amer 61 (*) >90 mL/min   COMPREHENSIVE METABOLIC PANEL     Status: Abnormal   Collection Time   01/23/12  6:20 AM      Component Value Range Comment   Sodium 140  135 - 145 mEq/L    Potassium 3.6  3.5 - 5.1 mEq/L    Chloride 106  96 - 112 mEq/L    CO2 25  19 - 32 mEq/L    Glucose, Bld 104 (*) 70 - 99 mg/dL    BUN 9  6 - 23 mg/dL    Creatinine, Ser 1.19  0.50 - 1.35 mg/dL    Calcium 8.2 (*) 8.4 - 10.5 mg/dL    Total Protein 5.7 (*) 6.0 - 8.3 g/dL    Albumin 2.6 (*) 3.5 - 5.2 g/dL    AST 30  0 - 37 U/L    ALT 83 (*) 0 - 53 U/L    Alkaline Phosphatase 125 (*) 39 - 117 U/L    Total Bilirubin 2.0 (*) 0.3 - 1.2 mg/dL    GFR calc non Af Amer 54 (*) >90 mL/min    GFR calc Af Amer 63 (*) >90 mL/min      HPI :76 yo admitted on 7/29 with generalized weakness abdominal discomfort, fever and chills for several days. Seen by PCP and noted to be hypotensive and have elevated liver enzymes. Does report some upper abdominal discomfort, but no nausea or vomiting. Denies frequency, urgency, dysuria. No cough, chest pain or mucus production. Also reports decreased oral intake. Continued to take diuretics. History of cholecystectomy 5 years ago. Of note, evaluated for suspected hepatitis ([facility], about 4 years ago) but deemed to be disease free.   HOSPITAL COURSE:  #1 SI RS suspected intra-abdominal infection, patient was started on Zosyn, urine culture blood culture was obtained, cultures remain negative to date, patient was transitioned to Augmentin by mouth for 5 more days per GI recommendations.  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 He did not have a liver biopsy.  Imaging includes ultrasound and CT  These show pneumobilia, area of low attenuation within the right hepatic lobe. Unable to confirm patency of intrahepatic portal vein. Non-specific perinephric stranding.  Thought to be secondary to allopurinol-induced liver injury No function test ordered improving. Hepatitis B panel was repeated, he was found to have a positive Patient had a positive Hep B IgM , but the remaining panel was negative, An MRCP did not reveal a CBD stone It was thought to be thickening to allopurinol-induced hepatotoxicity. Dr. Christella Hartigan will see the patient in 2-3 weeks   #3 anemia/thrombocytopenia Remained stable      Discharge Exam:  Blood pressure 128/64, pulse 60, temperature 98.8 F (37.1 C), temperature source Oral, resp. rate 16, height 5\' 11"  (1.803 m), weight 88.4 kg (194 lb 14.2 oz), SpO2 95.00%.   Head: Atraumatic.  Nose: Nose normal.  Mouth/Throat: Oropharynx is clear and moist.  Eyes: Conjunctivae are normal. Pupils are equal, round, and reactive  to light. No scleral icterus.  Neck: Neck supple. No tracheal deviation present.  Cardiovascular: Normal rate, regular rhythm, normal heart sounds and intact distal pulses.  Pulmonary/Chest: Effort normal and breath sounds normal. No respiratory distress.  Abdominal: Soft. Normal appearance and bowel sounds are normal. She exhibits no distension. There is no tenderness.  Musculoskeletal: She exhibits no edema and no tenderness.  Neurological: She is alert. No cranial nerve deficit.      Discharge Orders    Future Appointments: Provider: Department: Dept Phone: Center:   02/17/2012 2:15 PM Rachael Fee, MD [facility] Office [phone] [facility]     Future Orders Please Complete By Expires   Diet - low sodium heart healthy      Scheduling Instructions:   Low fat   Increase activity slowly      Call MD for:  persistant nausea and vomiting      Call MD for:  severe uncontrolled pain      Call MD for:   redness, tenderness, or signs of infection (pain, swelling, redness, odor or green/yellow discharge around incision site)           Signed: Daman Steffenhagen 01/23/2012, 11:14 AM

## 2012-01-23 NOTE — Progress Notes (Signed)
Patient discharged to home in care of spouse. Medications and instructions reviewed with patient and wife with no questions. IV d/c'd with cath intact and site CDI. Assessment unchanged from this am. Patient is to call PCP with any other concerns or issues. No follow up appointment needed.

## 2012-01-26 LAB — CULTURE, BLOOD (ROUTINE X 2)
Culture: NO GROWTH
Culture: NO GROWTH

## 2012-02-16 ENCOUNTER — Other Ambulatory Visit (INDEPENDENT_AMBULATORY_CARE_PROVIDER_SITE_OTHER): Payer: Medicare Other

## 2012-02-16 DIAGNOSIS — R17 Unspecified jaundice: Secondary | ICD-10-CM

## 2012-02-16 DIAGNOSIS — R7989 Other specified abnormal findings of blood chemistry: Secondary | ICD-10-CM

## 2012-02-16 LAB — HEPATIC FUNCTION PANEL
Albumin: 4 g/dL (ref 3.5–5.2)
Alkaline Phosphatase: 88 U/L (ref 39–117)
Bilirubin, Direct: 0.3 mg/dL (ref 0.0–0.3)
Total Protein: 7.2 g/dL (ref 6.0–8.3)

## 2012-02-17 ENCOUNTER — Encounter: Payer: Self-pay | Admitting: Gastroenterology

## 2012-02-17 ENCOUNTER — Ambulatory Visit (INDEPENDENT_AMBULATORY_CARE_PROVIDER_SITE_OTHER): Payer: Medicare Other | Admitting: Gastroenterology

## 2012-02-17 VITALS — BP 90/60 | HR 84 | Ht 70.0 in | Wt 182.0 lb

## 2012-02-17 DIAGNOSIS — R17 Unspecified jaundice: Secondary | ICD-10-CM

## 2012-02-17 NOTE — Patient Instructions (Addendum)
Do not take allopurinal. Call Dr. Christella Hartigan as needed. Recall colonoscopy in February 2014 for routine risk screening (last colonoscopy in 2004 by Dr. Virginia Rochester found no polyps).

## 2012-02-17 NOTE — Progress Notes (Signed)
Review of pertinent gastrointestinal problems: 1. cholestatic jaundice, July 2013, admitted to the hospital.  This occurred very shortly after starting allopurinol. tibili max 6.8. Ast/alt 200-300 range.  Ultrasound, CAT scan, MRCP showed non-dilated bile ducts without filling defects. There was pneumobilia present, likely from 2006 acute biliary, gallbladder infection that resulted in cholecystectomy and ERCP.  Liver tests improved with time, brief course of antibiotics. 2. ERCP, sphincterotomy, stone removal DR. Virginia Rochester 2006, for CBD stones, cholangitis   HPI: This is a   very pleasant 77 year old man whom I last saw when he was hospitalized at [facility] with an episode of cholestatic jaundice. See the workup summarized above. It was presumed that he had a reaction to allopurinol.  Back to normal now.  No jaundice, no pruritis, no abd pains.  Feeling well.  No problems.    Past Medical History  Diagnosis Date  . Hypertension   . Gout July 2013  . 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   . Ascending cholangitis 2006  . Cholecystitis, acute with cholelithiasis 2006    Past Surgical History  Procedure Date  . Laparoscopic cholecystectomy 06/2004    dr Abbey Chatters  . Ercp w/ sphicterotomy 06/2004     Ampulla in a diverticulum with multiple stones, found in the  common bile duct    Current Outpatient Prescriptions  Medication Sig Dispense Refill  . guanFACINE (TENEX) 2 MG tablet Take 2 mg by mouth at bedtime.      Marland Kitchen lisinopril (PRINIVIL,ZESTRIL) 30 MG tablet Take 30 mg by mouth daily.      . Multiple Vitamins-Minerals (CENTRUM SILVER PO) Take 1 tablet by mouth daily.      Marland Kitchen omega-3 acid ethyl esters (LOVAZA) 1 G capsule Take 1 g by mouth 2 (two) times daily.      Marland Kitchen omeprazole (PRILOSEC) 20 MG capsule Take 20 mg by mouth daily.      Marland Kitchen pyridOXINE (VITAMIN B-6) 100 MG tablet Take 100 mg by mouth daily.      . simvastatin (ZOCOR) 40 MG tablet  Take 40 mg by mouth every other day.      . triamterene-hydrochlorothiazide (MAXZIDE) 75-50 MG per tablet Take 1 tablet by mouth daily.        Allergies as of 02/17/2012 - Review Complete 02/17/2012  Allergen Reaction Noted  . Allopurinol  01/22/2012    Family History  Problem Relation Age of Onset  . Multiple sclerosis Sister   . Emphysema Father   . Heart attack Father   . Heart disease Mother     History   Social History  . Marital Status: Married    Spouse Name: N/A    Number of Children: 2  . Years of Education: N/A   Occupational History  . retired    Social History Main Topics  . Smoking status: Former Smoker    Types: Cigarettes  . Smokeless tobacco: Never Used  . Alcohol Use: Yes     a glass of wine every 3 days  . Drug Use: No  . Sexually Active: Not on file   Other Topics Concern  . Not on file   Social History Narrative  . No narrative on file      Physical Exam: Ht 5\' 10"  (1.778 m)  Wt 182 lb (82.555 kg)  BMI 26.11 kg/m2 Constitutional: generally well-appearing Psychiatric: alert and oriented x3 Abdomen: soft, nontender, nondistended, no obvious ascites, no peritoneal signs,  normal bowel sounds     Assessment and plan: 76 y.o. male with  result of cholestatic jaundice, likely medication reaction; routine risk for colon cancer  He has really completely recovered. 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 I don't think repeat liver tests are necessary. He knows to avoid allopurinol. I'm going to put him in our reminder system for repeat colonoscopy at 10 year interval from his last normal examination with Dr. Sabino Gasser.

## 2012-07-09 ENCOUNTER — Encounter: Payer: Self-pay | Admitting: Gastroenterology

## 2012-08-19 ENCOUNTER — Encounter: Payer: Self-pay | Admitting: Gastroenterology

## 2012-09-20 ENCOUNTER — Encounter: Payer: Self-pay | Admitting: Gastroenterology

## 2012-09-20 ENCOUNTER — Ambulatory Visit (AMBULATORY_SURGERY_CENTER): Payer: Medicare Other | Admitting: *Deleted

## 2012-09-20 VITALS — Ht 71.0 in | Wt 184.8 lb

## 2012-09-20 DIAGNOSIS — Z1211 Encounter for screening for malignant neoplasm of colon: Secondary | ICD-10-CM

## 2012-09-20 MED ORDER — MOVIPREP 100 G PO SOLR: ORAL | Status: DC

## 2012-09-21 DIAGNOSIS — D126 Benign neoplasm of colon, unspecified: Secondary | ICD-10-CM

## 2012-09-21 HISTORY — DX: Benign neoplasm of colon, unspecified: D12.6

## 2012-10-13 ENCOUNTER — Ambulatory Visit (AMBULATORY_SURGERY_CENTER): Payer: Medicare Other | Admitting: Gastroenterology

## 2012-10-13 ENCOUNTER — Encounter: Payer: Self-pay | Admitting: Gastroenterology

## 2012-10-13 VITALS — BP 100/64 | HR 59 | Temp 97.0°F | Resp 20 | Ht 71.0 in | Wt 184.0 lb

## 2012-10-13 DIAGNOSIS — Z1211 Encounter for screening for malignant neoplasm of colon: Secondary | ICD-10-CM

## 2012-10-13 DIAGNOSIS — D126 Benign neoplasm of colon, unspecified: Secondary | ICD-10-CM

## 2012-10-13 DIAGNOSIS — K5730 Diverticulosis of large intestine without perforation or abscess without bleeding: Secondary | ICD-10-CM

## 2012-10-13 MED ORDER — SODIUM CHLORIDE 0.9 % IV SOLN: 500.0000 mL | INTRAVENOUS | Status: DC

## 2012-10-13 NOTE — Op Note (Signed)
[facility] [address] [place], [address]   COLONOSCOPY PROCEDURE REPORT  PATIENT: Jesse Hoffman, Jesse Hoffman  MR#: [id number] BIRTHDATE: 1936/01/24 , 76  yrs. old GENDER: Male ENDOSCOPIST: Rachael Fee, MD PROCEDURE DATE:  10/13/2012 PROCEDURE:   Colonoscopy with snare polypectomy ASA CLASS:   Class II INDICATIONS:average risk screening (colonoscopy without polyps 10 years ago Dr. Virginia Rochester) MEDICATIONS: Fentanyl 62.5 mcg IV, Versed 5 mg IV, and These medications were titrated to patient response per physician's verbal order  DESCRIPTION OF PROCEDURE:   After the risks benefits and alternatives of the procedure were thoroughly explained, informed consent was obtained.  A digital rectal exam revealed no abnormalities of the rectum.   The LB [id number]  endoscope was introduced through the anus and advanced to the cecum, which was identified by both the appendix and ileocecal valve. No adverse events experienced.   The quality of the prep was good.  The instrument was then slowly withdrawn as the colon was fully examined.  COLON FINDINGS: Five polyps were found, removed and sent to pathology.  Four of them were removed with cold snare; these were 3-79mm across, sessile, located in cecum, descending and sigmoid segment; all sent to pathology Jar 1.  One polyp was pedunculated, 12mm across, located in distal sigmoid, removed with snare/cautery, sent to pathology Jar 2.  There were diverticulum throughout the colon.  The examination was otherwise normal.  Retroflexed views revealed no abnormalities. The time to cecum=4 minutes 07 seconds. Withdrawal time=13 minutes 58 seconds.  The scope was withdrawn and the procedure completed. COMPLICATIONS: There were no complications.  ENDOSCOPIC IMPRESSION: Five polyps were found, removed and sent to pathology. There were diverticulum throughout the colon. The examination was otherwise normal.  RECOMMENDATIONS: If the  polyp(s) removed today are proven to be adenomatous (pre-cancerous) polyps, you will need a repeat colonoscopy in 3 years.  Otherwise you should continue to follow colorectal cancer screening guidelines for "routine risk" patients with colonoscopy in 10 years.  You will receive a letter within 1-2 weeks with the results of your biopsy as well as final recommendations.  Please call my office if you have not received a letter after 3 weeks.   eSigned:  Rachael Fee, MD 10/13/2012 11:16 AM

## 2012-10-13 NOTE — Progress Notes (Signed)
Patient did not experience any of the following events: a burn prior to discharge; a fall within the facility; wrong site/side/patient/procedure/implant event; or a hospital transfer or hospital admission upon discharge from the facility. (G8907) Patient did not have preoperative order for IV antibiotic SSI prophylaxis. (G8918)  

## 2012-10-13 NOTE — Patient Instructions (Signed)
Colon polyps x 5 removed today and sent to pathology. Diverticulosis seen today.   Hold aspirin, aspirin containing products and anti-inflammatory medications for 1 week per Dr.Jacobs.  Resume current medication list.   Handouts given on polyps,diverticulosis. Call us with any questions or concerns. Thank you!  YOU HAD AN ENDOSCOPIC PROCEDURE TODAY AT THE [facility]: Refer to the procedure report that was given to you for any specific questions about what was found during the examination.  If the procedure report does not answer your questions, please call your gastroenterologist to clarify.  If you requested that your care partner not be given the details of your procedure findings, then the procedure report has been included in a sealed envelope for you to review at your convenience later.  YOU SHOULD EXPECT: Some feelings of bloating in the abdomen. Passage of more gas than usual.  Walking can help get rid of the air that was put into your GI tract during the procedure and reduce the bloating. If you had a lower endoscopy (such as a colonoscopy or flexible sigmoidoscopy) you may notice spotting of blood in your stool or on the toilet paper. If you underwent a bowel prep for your procedure, then you may not have a normal bowel movement for a few days.  DIET: Your first meal following the procedure should be a light meal and then it is ok to progress to your normal diet.  A half-sandwich or bowl of soup is an example of a good first meal.  Heavy or fried foods are harder to digest and may make you feel nauseous or bloated.  Likewise meals heavy in dairy and vegetables can cause extra gas to form and this can also increase the bloating.  Drink plenty of fluids but you should avoid alcoholic beverages for 24 hours.  ACTIVITY: Your care partner should take you home directly after the procedure.  You should plan to take it easy, moving slowly for the rest of the day.  You can resume normal  activity the day after the procedure however you should NOT DRIVE or use heavy machinery for 24 hours (because of the sedation medicines used during the test).    SYMPTOMS TO REPORT IMMEDIATELY: A gastroenterologist can be reached at any hour.  During normal business hours, 8:30 AM to 5:00 PM Monday through Friday, call [phone].  After hours and on weekends, please call the GI answering service at [phone] who will take a message and have the physician on call contact you.   Following lower endoscopy (colonoscopy or flexible sigmoidoscopy):  Excessive amounts of blood in the stool  Significant tenderness or worsening of abdominal pains  Swelling of the abdomen that is new, acute  Fever of 100F or higher  Following upper endoscopy (EGD)  Vomiting of blood or coffee ground material  New chest pain or pain under the shoulder blades  Painful or persistently difficult swallowing  New shortness of breath  Fever of 100F or higher  Black, tarry-looking stools  FOLLOW UP: If any biopsies were taken you will be contacted by phone or by letter within the next 1-3 weeks.  Call your gastroenterologist if you have not heard about the biopsies in 3 weeks.  Our staff will call the home number listed on your records the next business day following your procedure to check on you and address any questions or concerns that you may have at that time regarding the information given to you following your procedure.  This is a courtesy call and so if there is no answer at the home number and we have not heard from you through the emergency physician on call, we will assume that you have returned to your regular daily activities without incident.  SIGNATURES/CONFIDENTIALITY: You and/or your care partner have signed paperwork which will be entered into your electronic medical record.  These signatures attest to the fact that that the information above on your After Visit Summary has been reviewed and is  understood.  Full responsibility of the confidentiality of this discharge information lies with you and/or your care-partner.

## 2012-10-14 ENCOUNTER — Telehealth: Payer: Self-pay | Admitting: *Deleted

## 2012-10-14 NOTE — Telephone Encounter (Signed)
  Follow up Call-  Call back number 10/13/2012  Post procedure Call Back phone  # [phone]  Permission to leave phone message Yes     Patient questions:  Do you have a fever, pain , or abdominal swelling? no Pain Score  0 *  Have you tolerated food without any problems? yes  Have you been able to return to your normal activities? yes  Do you have any questions about your discharge instructions: Diet   no Medications  no Follow up visit  no  Do you have questions or concerns about your Care? no  Actions: * If pain score is 4 or above: No action needed, pain <4.

## 2012-10-20 ENCOUNTER — Encounter: Payer: Self-pay | Admitting: Gastroenterology

## 2014-08-18 DIAGNOSIS — D225 Melanocytic nevi of trunk: Secondary | ICD-10-CM | POA: Diagnosis not present

## 2014-08-18 DIAGNOSIS — L820 Inflamed seborrheic keratosis: Secondary | ICD-10-CM | POA: Diagnosis not present

## 2014-08-18 DIAGNOSIS — L821 Other seborrheic keratosis: Secondary | ICD-10-CM | POA: Diagnosis not present

## 2014-08-18 DIAGNOSIS — L570 Actinic keratosis: Secondary | ICD-10-CM | POA: Diagnosis not present

## 2014-08-18 DIAGNOSIS — D1801 Hemangioma of skin and subcutaneous tissue: Secondary | ICD-10-CM | POA: Diagnosis not present

## 2014-09-28 DIAGNOSIS — Z7689 Persons encountering health services in other specified circumstances: Secondary | ICD-10-CM | POA: Diagnosis not present

## 2014-09-28 DIAGNOSIS — E1165 Type 2 diabetes mellitus with hyperglycemia: Secondary | ICD-10-CM | POA: Diagnosis not present

## 2014-10-12 DIAGNOSIS — M1A0790 Idiopathic chronic gout, unspecified ankle and foot, without tophus (tophi): Secondary | ICD-10-CM | POA: Diagnosis not present

## 2014-10-12 DIAGNOSIS — E1165 Type 2 diabetes mellitus with hyperglycemia: Secondary | ICD-10-CM | POA: Diagnosis not present

## 2014-10-12 DIAGNOSIS — N183 Chronic kidney disease, stage 3 (moderate): Secondary | ICD-10-CM | POA: Diagnosis not present

## 2014-10-12 DIAGNOSIS — I10 Essential (primary) hypertension: Secondary | ICD-10-CM | POA: Diagnosis not present

## 2014-10-13 DIAGNOSIS — M1A09X0 Idiopathic chronic gout, multiple sites, without tophus (tophi): Secondary | ICD-10-CM | POA: Diagnosis not present

## 2015-01-22 DIAGNOSIS — K8309 Other cholangitis: Secondary | ICD-10-CM

## 2015-01-22 DIAGNOSIS — D696 Thrombocytopenia, unspecified: Secondary | ICD-10-CM

## 2015-01-22 HISTORY — DX: Other cholangitis: K83.09

## 2015-01-22 HISTORY — DX: Thrombocytopenia, unspecified: D69.6

## 2015-01-25 DIAGNOSIS — M1A09X0 Idiopathic chronic gout, multiple sites, without tophus (tophi): Secondary | ICD-10-CM | POA: Diagnosis not present

## 2015-01-25 DIAGNOSIS — I10 Essential (primary) hypertension: Secondary | ICD-10-CM | POA: Diagnosis not present

## 2015-02-01 ENCOUNTER — Inpatient Hospital Stay (HOSPITAL_COMMUNITY)
Admission: EM | Admit: 2015-02-01 | Discharge: 2015-02-06 | DRG: 445 | Disposition: A | Discharge status: Home / Self Care | Payer: Medicare Other | Attending: Internal Medicine | Admitting: Internal Medicine

## 2015-02-01 ENCOUNTER — Emergency Department (HOSPITAL_COMMUNITY): Payer: Medicare Other

## 2015-02-01 ENCOUNTER — Encounter (HOSPITAL_COMMUNITY): Payer: Self-pay | Admitting: Emergency Medicine

## 2015-02-01 DIAGNOSIS — K8050 Calculus of bile duct without cholangitis or cholecystitis without obstruction: Secondary | ICD-10-CM | POA: Insufficient documentation

## 2015-02-01 DIAGNOSIS — E860 Dehydration: Secondary | ICD-10-CM | POA: Diagnosis present

## 2015-02-01 DIAGNOSIS — Z87891 Personal history of nicotine dependence: Secondary | ICD-10-CM | POA: Diagnosis not present

## 2015-02-01 DIAGNOSIS — E876 Hypokalemia: Secondary | ICD-10-CM | POA: Diagnosis not present

## 2015-02-01 DIAGNOSIS — R748 Abnormal levels of other serum enzymes: Secondary | ICD-10-CM | POA: Diagnosis present

## 2015-02-01 DIAGNOSIS — N179 Acute kidney failure, unspecified: Secondary | ICD-10-CM | POA: Diagnosis present

## 2015-02-01 DIAGNOSIS — Z9049 Acquired absence of other specified parts of digestive tract: Secondary | ICD-10-CM | POA: Diagnosis not present

## 2015-02-01 DIAGNOSIS — N183 Chronic kidney disease, stage 3 unspecified: Secondary | ICD-10-CM | POA: Diagnosis present

## 2015-02-01 DIAGNOSIS — K8033 Calculus of bile duct with acute cholangitis with obstruction: Principal | ICD-10-CM | POA: Diagnosis present

## 2015-02-01 DIAGNOSIS — N281 Cyst of kidney, acquired: Secondary | ICD-10-CM | POA: Diagnosis not present

## 2015-02-01 DIAGNOSIS — R1013 Epigastric pain: Secondary | ICD-10-CM | POA: Diagnosis not present

## 2015-02-01 DIAGNOSIS — R1010 Upper abdominal pain, unspecified: Secondary | ICD-10-CM | POA: Diagnosis not present

## 2015-02-01 DIAGNOSIS — I2510 Atherosclerotic heart disease of native coronary artery without angina pectoris: Secondary | ICD-10-CM | POA: Diagnosis present

## 2015-02-01 DIAGNOSIS — N400 Enlarged prostate without lower urinary tract symptoms: Secondary | ICD-10-CM | POA: Diagnosis not present

## 2015-02-01 DIAGNOSIS — R110 Nausea: Secondary | ICD-10-CM | POA: Diagnosis not present

## 2015-02-01 DIAGNOSIS — K219 Gastro-esophageal reflux disease without esophagitis: Secondary | ICD-10-CM | POA: Diagnosis not present

## 2015-02-01 DIAGNOSIS — D696 Thrombocytopenia, unspecified: Secondary | ICD-10-CM | POA: Diagnosis not present

## 2015-02-01 DIAGNOSIS — K5730 Diverticulosis of large intestine without perforation or abscess without bleeding: Secondary | ICD-10-CM | POA: Diagnosis not present

## 2015-02-01 DIAGNOSIS — D649 Anemia, unspecified: Secondary | ICD-10-CM | POA: Diagnosis not present

## 2015-02-01 DIAGNOSIS — R6889 Other general symptoms and signs: Secondary | ICD-10-CM

## 2015-02-01 DIAGNOSIS — I10 Essential (primary) hypertension: Secondary | ICD-10-CM | POA: Diagnosis not present

## 2015-02-01 DIAGNOSIS — R109 Unspecified abdominal pain: Secondary | ICD-10-CM | POA: Diagnosis present

## 2015-02-01 DIAGNOSIS — E785 Hyperlipidemia, unspecified: Secondary | ICD-10-CM | POA: Diagnosis not present

## 2015-02-01 DIAGNOSIS — E119 Type 2 diabetes mellitus without complications: Secondary | ICD-10-CM | POA: Diagnosis not present

## 2015-02-01 DIAGNOSIS — M109 Gout, unspecified: Secondary | ICD-10-CM | POA: Diagnosis present

## 2015-02-01 DIAGNOSIS — K862 Cyst of pancreas: Secondary | ICD-10-CM

## 2015-02-01 DIAGNOSIS — Z888 Allergy status to other drugs, medicaments and biological substances status: Secondary | ICD-10-CM | POA: Diagnosis not present

## 2015-02-01 DIAGNOSIS — E1165 Type 2 diabetes mellitus with hyperglycemia: Secondary | ICD-10-CM | POA: Diagnosis not present

## 2015-02-01 DIAGNOSIS — I129 Hypertensive chronic kidney disease with stage 1 through stage 4 chronic kidney disease, or unspecified chronic kidney disease: Secondary | ICD-10-CM | POA: Diagnosis not present

## 2015-02-01 DIAGNOSIS — M1A0790 Idiopathic chronic gout, unspecified ankle and foot, without tophus (tophi): Secondary | ICD-10-CM | POA: Diagnosis not present

## 2015-02-01 DIAGNOSIS — K868 Other specified diseases of pancreas: Secondary | ICD-10-CM | POA: Diagnosis not present

## 2015-02-01 HISTORY — DX: Type 2 diabetes mellitus without complications: E11.9

## 2015-02-01 HISTORY — DX: Chronic kidney disease, stage 3 (moderate): N18.3

## 2015-02-01 HISTORY — DX: Hyperlipidemia, unspecified: E78.5

## 2015-02-01 HISTORY — DX: Gastro-esophageal reflux disease without esophagitis: K21.9

## 2015-02-01 HISTORY — DX: Chronic kidney disease, stage 3 unspecified: N18.30

## 2015-02-01 HISTORY — DX: Benign neoplasm of colon, unspecified: D12.6

## 2015-02-01 HISTORY — DX: Other cholangitis: K83.09

## 2015-02-01 HISTORY — DX: Thrombocytopenia, unspecified: D69.6

## 2015-02-01 LAB — COMPREHENSIVE METABOLIC PANEL
ALK PHOS: 191 U/L — AB (ref 38–126)
ALT: 333 U/L — AB (ref 17–63)
AST: 563 U/L — ABNORMAL HIGH (ref 15–41)
Albumin: 4.1 g/dL (ref 3.5–5.0)
Anion gap: 12 (ref 5–15)
BUN: 23 mg/dL — AB (ref 6–20)
CO2: 26 mmol/L (ref 22–32)
Calcium: 9.2 mg/dL (ref 8.9–10.3)
Chloride: 99 mmol/L — ABNORMAL LOW (ref 101–111)
Creatinine, Ser: 1.53 mg/dL — ABNORMAL HIGH (ref 0.61–1.24)
GFR calc Af Amer: 49 mL/min — ABNORMAL LOW (ref >60)
GFR calc non Af Amer: 42 mL/min — ABNORMAL LOW (ref >60)
GLUCOSE: 228 mg/dL — AB (ref 65–99)
Potassium: 3.8 mmol/L (ref 3.5–5.1)
Sodium: 137 mmol/L (ref 135–145)
Total Bilirubin: 2 mg/dL — ABNORMAL HIGH (ref 0.3–1.2)
Total Protein: 7.1 g/dL (ref 6.5–8.1)

## 2015-02-01 LAB — CBC
HEMATOCRIT: 38.1 % — AB (ref 39.0–52.0)
Hemoglobin: 13.3 g/dL (ref 13.0–17.0)
MCH: 31.1 pg (ref 26.0–34.0)
MCHC: 34.9 g/dL (ref 30.0–36.0)
MCV: 89.2 fL (ref 78.0–100.0)
PLATELETS: 183 10*3/uL (ref 150–400)
RBC: 4.27 MIL/uL (ref 4.22–5.81)
RDW: 12.7 % (ref 11.5–15.5)
WBC: 10.1 10*3/uL (ref 4.0–10.5)

## 2015-02-01 LAB — URINALYSIS, ROUTINE W REFLEX MICROSCOPIC
Bilirubin Urine: NEGATIVE
Glucose, UA: NEGATIVE mg/dL
HGB URINE DIPSTICK: NEGATIVE
Ketones, ur: NEGATIVE mg/dL
Leukocytes, UA: NEGATIVE
NITRITE: NEGATIVE
Protein, ur: NEGATIVE mg/dL
SPECIFIC GRAVITY, URINE: 1.014 (ref 1.005–1.030)
Urobilinogen, UA: 1 mg/dL (ref 0.0–1.0)
pH: 6 (ref 5.0–8.0)

## 2015-02-01 LAB — LIPASE, BLOOD: Lipase: 74 U/L — ABNORMAL HIGH (ref 22–51)

## 2015-02-01 MED ORDER — ONDANSETRON HCL 4 MG/2ML IJ SOLN
4.0000 mg | Freq: Once | INTRAMUSCULAR | Status: AC
  Administered 2015-02-01: 4 mg via INTRAVENOUS
  Filled 2015-02-01: qty 2

## 2015-02-01 MED ORDER — SODIUM CHLORIDE 0.9 % IV BOLUS (SEPSIS)
1000.0000 mL | Freq: Once | INTRAVENOUS | Status: AC
  Administered 2015-02-01: 1000 mL via INTRAVENOUS

## 2015-02-01 MED ORDER — IOHEXOL 300 MG/ML  SOLN
25.0000 mL | Freq: Once | INTRAMUSCULAR | Status: AC | PRN
  Administered 2015-02-01: 25 mL via ORAL

## 2015-02-01 MED ORDER — IOHEXOL 300 MG/ML  SOLN
80.0000 mL | Freq: Once | INTRAMUSCULAR | Status: AC | PRN
  Administered 2015-02-01: 80 mL via INTRAVENOUS

## 2015-02-01 MED ORDER — MORPHINE SULFATE 4 MG/ML IJ SOLN
4.0000 mg | Freq: Once | INTRAMUSCULAR | Status: AC
  Administered 2015-02-01: 4 mg via INTRAVENOUS
  Filled 2015-02-01: qty 1

## 2015-02-01 NOTE — ED Provider Notes (Signed)
CSN: [id number]     Arrival date & time 02/01/15  2138 History   First MD Initiated Contact with Patient 02/01/15 2208     Chief Complaint  Patient presents with  . Abdominal Pain     (Consider location/radiation/quality/duration/timing/severity/associated sxs/prior Treatment) The history is provided by the patient, the spouse and medical records. No language interpreter was used.   Patient is a 79 year old male past medical history of a ascending cholangitis, cholecystitis, hypertension who presents today with epigastric pain that started approximately 5 hours ago. Patient relates that this pain was sudden onset and he describes it as something that feels like it's pushing down on his epigastric area. He relates he feels short of breath with this as well. He denies any chest pain but endorses having nausea. He denies any hematuria or dysuria. He denies any diarrhea. He relates that this pain feels similar to his gallbladder problems that he's had in the past. He relates he is a transient episode similar to this in the last year but nothing nearly this bad. She does endorse having chills but he denies any recent fever. He denies any worsening or alleviating factors.  Past Medical History  Diagnosis Date  . Hypertension   . Gout July 2013  . 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   . Ascending cholangitis 2006  . Cholecystitis, acute with cholelithiasis 2006  . Diabetes mellitus without complication   . HLD (hyperlipidemia)   . GERD (gastroesophageal reflux disease)   . CKD (chronic kidney disease), stage III    Past Surgical History  Procedure Laterality Date  . Laparoscopic cholecystectomy  06/2004    dr Zella Richer  . Ercp w/ sphicterotomy  06/2004     Ampulla in a diverticulum with multiple stones, found in the  common bile duct   Family History  Problem Relation Age of Onset  . Multiple sclerosis Sister   . Emphysema Father   . Heart  attack Father   . Heart disease Mother   . Colon cancer Neg Hx    Social History  Substance Use Topics  . Smoking status: Former Smoker    Types: Cigarettes    Quit date: 09/21/1963  . Smokeless tobacco: Never Used  . Alcohol Use: Yes     Comment: a glass of wine every 3 days    Review of Systems  Constitutional: Positive for chills. Negative for fever.  HENT: Negative for congestion and rhinorrhea.   Respiratory: Positive for shortness of breath. Negative for chest tightness.   Cardiovascular: Negative for chest pain.  Gastrointestinal: Positive for nausea and abdominal pain (diffuse, severe). Negative for vomiting and diarrhea.  Genitourinary: Negative for dysuria and hematuria.  Skin: Negative for pallor and rash.  Neurological: Negative for dizziness, light-headedness and headaches.  Psychiatric/Behavioral: Negative for confusion and agitation.  All other systems reviewed and are negative.     Allergies  Allopurinol  Home Medications   Prior to Admission medications   Medication Sig Start Date End Date Taking? Authorizing Provider  atorvastatin (LIPITOR) 40 MG tablet Take 40 mg by mouth daily at 6 PM.  10/05/12  Yes Historical Provider, MD  lisinopril (PRINIVIL,ZESTRIL) 30 MG tablet Take 30 mg by mouth daily.   Yes Historical Provider, MD  losartan (COZAAR) 50 MG tablet Take 50 mg by mouth daily.   Yes Historical Provider, MD  metFORMIN (GLUCOPHAGE-XR) 500 MG 24 hr tablet Take 500 mg by mouth daily with  breakfast.   Yes Historical Provider, MD  omeprazole (PRILOSEC) 20 MG capsule Take 20 mg by mouth daily.   Yes Historical Provider, MD  triamterene-hydrochlorothiazide (MAXZIDE) 75-50 MG per tablet Take 1 tablet by mouth daily.   Yes Historical Provider, MD   BP 120/73 mmHg  Pulse 90  Temp(Src) 98.2 F (36.8 C)  Resp 23  Ht 5\' 10"  (1.778 m)  Wt 182 lb (82.555 kg)  BMI 26.11 kg/m2  SpO2 98% Physical Exam  Constitutional: He is oriented to person, place, and time.  He appears distressed (secondary to pain).  HENT:  Head: Normocephalic and atraumatic.  Eyes: Conjunctivae and EOM are normal. Pupils are equal, round, and reactive to light.  Neck: Normal range of motion. Neck supple.  Cardiovascular: Normal rate and regular rhythm.   Pulmonary/Chest: Effort normal and breath sounds normal. No respiratory distress.  Abdominal: He exhibits no distension. There is tenderness (Diffuse, Severe). There is guarding.  Musculoskeletal: Normal range of motion. He exhibits no tenderness.  Neurological: He is alert and oriented to person, place, and time.  Skin: Skin is warm and dry. He is not diaphoretic.  Psychiatric: He has a normal mood and affect. His behavior is normal.  Nursing note and vitals reviewed.    ED Course  Procedures (including critical care time) Labs Review Labs Reviewed  LIPASE, BLOOD - Abnormal; Notable for the following:    Lipase 74 (*)    All other components within normal limits  COMPREHENSIVE METABOLIC PANEL - Abnormal; Notable for the following:    Chloride 99 (*)    Glucose, Bld 228 (*)    BUN 23 (*)    Creatinine, Ser 1.53 (*)    AST 563 (*)    ALT 333 (*)    Alkaline Phosphatase 191 (*)    Total Bilirubin 2.0 (*)    GFR calc non Af Amer 42 (*)    GFR calc Af Amer 49 (*)    All other components within normal limits  CBC - Abnormal; Notable for the following:    HCT 38.1 (*)    All other components within normal limits  I-STAT CG4 LACTIC ACID, ED - Abnormal; Notable for the following:    Lactic Acid, Venous 2.31 (*)    All other components within normal limits  URINALYSIS, ROUTINE W REFLEX MICROSCOPIC (NOT AT [facility])    Imaging Review Ct Abdomen Pelvis W Contrast  02/02/2015   CLINICAL DATA:  Acute onset of epigastric abdominal pain and nausea. Initial encounter.  EXAM: CT ABDOMEN AND PELVIS WITH CONTRAST  TECHNIQUE: Multidetector CT imaging of the abdomen and pelvis was performed using the standard protocol following  bolus administration of intravenous contrast.  CONTRAST:  76mL OMNIPAQUE IOHEXOL 300 MG/ML  SOLN  COMPARISON:  CT of the abdomen and pelvis performed 01/20/2012, abdominal ultrasound performed 01/19/2012, and MRCP performed 01/21/2012  FINDINGS: Minimal bibasilar atelectasis is noted.  Diffuse pneumobilia is noted. This likely reflects prior instrumentation at the duodenal ampulla. The patient is status post cholecystectomy. The liver and spleen are otherwise grossly unremarkable. The adrenal glands are grossly unremarkable. An 8 mm cystic focus is noted at the proximal body of the pancreas, with minimal adjacent calcification. This is of uncertain significance.  Scattered bilateral renal cysts are seen. Nonspecific perinephric stranding is noted bilaterally. The kidneys are otherwise unremarkable in appearance. There is no evidence of hydronephrosis. No renal or ureteral stones are identified. Mild prominence of the left ureter is thought remain within normal limits.  No free fluid is identified. The small bowel is unremarkable in appearance. The stomach is within normal limits. No acute vascular abnormalities are seen. Scattered calcification is noted along the abdominal aorta and its branches.  The appendix is normal in caliber and contains air, without evidence of appendicitis. Scattered diverticulosis is noted along the ascending and proximal transverse colon, and along the sigmoid colon, without evidence of diverticulitis.  The bladder is mildly distended and grossly unremarkable. A small urachal remnant is incidentally noted. An enlarged prostate is noted, measuring 6.3 cm in transverse dimension. No inguinal lymphadenopathy is seen.  No acute osseous abnormalities are identified.  IMPRESSION: 1. Diffuse pneumobilia noted, more prominent than in 2013 but apparently chronic in nature. This likely reflects prior instrumentation at the duodenal ampulla. 2. New 8 mm cystic focus at the proximal body of the  pancreas, with minimal adjacent calcification. This is of uncertain significance. MRCP could be considered for further evaluation, on an elective nonemergent basis. 3. Scattered bilateral renal cysts seen. 4. Scattered calcification along the abdominal aorta and its branches. 5. Scattered diverticulosis along the ascending and proximal transverse colon, and along the sigmoid colon, without evidence of diverticulitis. 6. Enlarged prostate noted.   Electronically Signed   By: Garald Balding M.D.   On: 02/02/2015 00:22   Dg Abd Acute W/chest  02/02/2015   CLINICAL DATA:  Abdominal pain and nausea  EXAM: DG ABDOMEN ACUTE W/ 1V CHEST  COMPARISON:  Chest radiograph January 19, 2012; CT abdomen and pelvis January 20, 2012  FINDINGS: PA chest: No edema or consolidation. Heart size and pulmonary vascularity are normal. No adenopathy. There is atherosclerotic change in the aorta.  Supine and upright abdomen: There is moderate stool throughout the colon. There is no bowel dilatation or air-fluid level suggesting obstruction. No free air. There are surgical clips in the right upper quadrant.  IMPRESSION: Bowel gas pattern unremarkable.  No edema or consolidation.   Electronically Signed   By: Lowella Grip III M.D.   On: 02/02/2015 00:18   I, Theodosia Quay, personally reviewed and evaluated these images and lab results as part of my medical decision-making.   EKG Interpretation None      MDM   Final diagnoses:  Epigastric pain  Rigors  Nausea in adult    Patient is a 79 year old male past medical history of a ascending cholangitis, cholecystitis, hypertension who presents today with epigastric pain that started approximately 5 hours ago. On exam patient has a significantly tender abdomen with guarding present. Concern is for acute surgical abdomen. Obtained an upright abdominal series which did not show any free air under the diaphragm. Obtained labs including CBC, CMP, lactic acid, urinalysis, lipase. CBC is  notable for a white count of 10.1. Additionally patient has significantly elevated LFTs in the 4 and 500 range. T bili is 2.0. Alkaline phosphatase is also elevated. CT abdomen and pelvis was ordered and shows pneumobilia within the portal venous system. Radiology describes this is unchanged from prior imaging. Additionally cyst on the pancreas and kidneys are also seen as well. I paged internal medicine who will plan to admit the patient. Also called and discussed with gastroenterology who will plan to see the patient in the morning. At this point they recommend Zosyn for coverage of GI pathogens. Attempted to page surgery however note call was received. On serial abdominal exams the patient's abdominal pain had significantly improved from prior exam. Abdomen was significantly more soft and patient relates his pain has  improved. Would defer subsequent attempts for consultations with surgery to the day team with internal medicine should they feel this is indicated. At this point I felt the gastrology may be more appropriate to handle patients and nominal pathology.    Theodosia Quay, MD 02/02/15 1031  Theodosia Quay, MD 02/02/15 2811  Debby Freiberg, MD 02/02/15 [id number]

## 2015-02-01 NOTE — ED Notes (Signed)
Patient transported to CT 

## 2015-02-01 NOTE — ED Notes (Signed)
Pt. reports upper abdominal pain with nausea onset this evening , denies emesis or diarrhea.

## 2015-02-02 ENCOUNTER — Inpatient Hospital Stay (HOSPITAL_COMMUNITY): Payer: Medicare Other

## 2015-02-02 ENCOUNTER — Encounter (HOSPITAL_COMMUNITY): Payer: Self-pay | Admitting: Internal Medicine

## 2015-02-02 DIAGNOSIS — R933 Abnormal findings on diagnostic imaging of other parts of digestive tract: Secondary | ICD-10-CM | POA: Diagnosis not present

## 2015-02-02 DIAGNOSIS — K838 Other specified diseases of biliary tract: Secondary | ICD-10-CM | POA: Diagnosis not present

## 2015-02-02 DIAGNOSIS — E785 Hyperlipidemia, unspecified: Secondary | ICD-10-CM | POA: Diagnosis present

## 2015-02-02 DIAGNOSIS — I2510 Atherosclerotic heart disease of native coronary artery without angina pectoris: Secondary | ICD-10-CM | POA: Diagnosis present

## 2015-02-02 DIAGNOSIS — N183 Chronic kidney disease, stage 3 (moderate): Secondary | ICD-10-CM

## 2015-02-02 DIAGNOSIS — K8020 Calculus of gallbladder without cholecystitis without obstruction: Secondary | ICD-10-CM | POA: Diagnosis not present

## 2015-02-02 DIAGNOSIS — R109 Unspecified abdominal pain: Secondary | ICD-10-CM

## 2015-02-02 DIAGNOSIS — E860 Dehydration: Secondary | ICD-10-CM | POA: Diagnosis not present

## 2015-02-02 DIAGNOSIS — Z87891 Personal history of nicotine dependence: Secondary | ICD-10-CM | POA: Diagnosis not present

## 2015-02-02 DIAGNOSIS — R932 Abnormal findings on diagnostic imaging of liver and biliary tract: Secondary | ICD-10-CM | POA: Diagnosis not present

## 2015-02-02 DIAGNOSIS — K8033 Calculus of bile duct with acute cholangitis with obstruction: Secondary | ICD-10-CM | POA: Diagnosis not present

## 2015-02-02 DIAGNOSIS — M109 Gout, unspecified: Secondary | ICD-10-CM | POA: Diagnosis present

## 2015-02-02 DIAGNOSIS — Z9889 Other specified postprocedural states: Secondary | ICD-10-CM | POA: Diagnosis not present

## 2015-02-02 DIAGNOSIS — R1013 Epigastric pain: Secondary | ICD-10-CM | POA: Insufficient documentation

## 2015-02-02 DIAGNOSIS — K219 Gastro-esophageal reflux disease without esophagitis: Secondary | ICD-10-CM | POA: Diagnosis present

## 2015-02-02 DIAGNOSIS — N281 Cyst of kidney, acquired: Secondary | ICD-10-CM | POA: Diagnosis not present

## 2015-02-02 DIAGNOSIS — K5730 Diverticulosis of large intestine without perforation or abscess without bleeding: Secondary | ICD-10-CM | POA: Diagnosis not present

## 2015-02-02 DIAGNOSIS — R1010 Upper abdominal pain, unspecified: Secondary | ICD-10-CM | POA: Diagnosis not present

## 2015-02-02 DIAGNOSIS — K8050 Calculus of bile duct without cholangitis or cholecystitis without obstruction: Secondary | ICD-10-CM | POA: Diagnosis not present

## 2015-02-02 DIAGNOSIS — D649 Anemia, unspecified: Secondary | ICD-10-CM | POA: Diagnosis present

## 2015-02-02 DIAGNOSIS — K830 Cholangitis: Secondary | ICD-10-CM | POA: Diagnosis not present

## 2015-02-02 DIAGNOSIS — I129 Hypertensive chronic kidney disease with stage 1 through stage 4 chronic kidney disease, or unspecified chronic kidney disease: Secondary | ICD-10-CM | POA: Diagnosis present

## 2015-02-02 DIAGNOSIS — Z888 Allergy status to other drugs, medicaments and biological substances status: Secondary | ICD-10-CM | POA: Diagnosis not present

## 2015-02-02 DIAGNOSIS — K862 Cyst of pancreas: Secondary | ICD-10-CM | POA: Diagnosis not present

## 2015-02-02 DIAGNOSIS — E876 Hypokalemia: Secondary | ICD-10-CM | POA: Diagnosis not present

## 2015-02-02 DIAGNOSIS — Z9049 Acquired absence of other specified parts of digestive tract: Secondary | ICD-10-CM | POA: Diagnosis present

## 2015-02-02 DIAGNOSIS — E119 Type 2 diabetes mellitus without complications: Secondary | ICD-10-CM | POA: Diagnosis not present

## 2015-02-02 DIAGNOSIS — R748 Abnormal levels of other serum enzymes: Secondary | ICD-10-CM | POA: Diagnosis not present

## 2015-02-02 DIAGNOSIS — N200 Calculus of kidney: Secondary | ICD-10-CM | POA: Diagnosis not present

## 2015-02-02 DIAGNOSIS — D696 Thrombocytopenia, unspecified: Secondary | ICD-10-CM | POA: Diagnosis not present

## 2015-02-02 DIAGNOSIS — N179 Acute kidney failure, unspecified: Secondary | ICD-10-CM | POA: Diagnosis not present

## 2015-02-02 DIAGNOSIS — I10 Essential (primary) hypertension: Secondary | ICD-10-CM | POA: Insufficient documentation

## 2015-02-02 LAB — I-STAT CG4 LACTIC ACID, ED
LACTIC ACID, VENOUS: 2.31 mmol/L — AB (ref 0.5–2.0)
LACTIC ACID, VENOUS: 2.35 mmol/L — AB (ref 0.5–2.0)

## 2015-02-02 LAB — CBC
HCT: 35 % — ABNORMAL LOW (ref 39.0–52.0)
Hemoglobin: 11.9 g/dL — ABNORMAL LOW (ref 13.0–17.0)
MCH: 30.3 pg (ref 26.0–34.0)
MCHC: 34 g/dL (ref 30.0–36.0)
MCV: 89.1 fL (ref 78.0–100.0)
PLATELETS: 145 10*3/uL — AB (ref 150–400)
RBC: 3.93 MIL/uL — AB (ref 4.22–5.81)
RDW: 12.7 % (ref 11.5–15.5)
WBC: 7.1 10*3/uL (ref 4.0–10.5)

## 2015-02-02 LAB — COMPREHENSIVE METABOLIC PANEL
ALBUMIN: 3.4 g/dL — AB (ref 3.5–5.0)
ALT: 1093 U/L — ABNORMAL HIGH (ref 17–63)
ANION GAP: 10 (ref 5–15)
AST: 1337 U/L — ABNORMAL HIGH (ref 15–41)
Alkaline Phosphatase: 166 U/L — ABNORMAL HIGH (ref 38–126)
BILIRUBIN TOTAL: 3.1 mg/dL — AB (ref 0.3–1.2)
BUN: 23 mg/dL — AB (ref 6–20)
CALCIUM: 8.2 mg/dL — AB (ref 8.9–10.3)
CO2: 24 mmol/L (ref 22–32)
CREATININE: 1.86 mg/dL — AB (ref 0.61–1.24)
Chloride: 103 mmol/L (ref 101–111)
GFR, EST AFRICAN AMERICAN: 39 mL/min — AB (ref >60)
GFR, EST NON AFRICAN AMERICAN: 34 mL/min — AB (ref >60)
Glucose, Bld: 239 mg/dL — ABNORMAL HIGH (ref 65–99)
Potassium: 3.6 mmol/L (ref 3.5–5.1)
Sodium: 137 mmol/L (ref 135–145)
Total Protein: 6 g/dL — ABNORMAL LOW (ref 6.5–8.1)

## 2015-02-02 LAB — PROCALCITONIN: Procalcitonin: 14.41 ng/mL

## 2015-02-02 LAB — LIPID PANEL
Cholesterol: 119 mg/dL (ref 0–200)
HDL: 25 mg/dL — AB (ref >40)
LDL CALC: 57 mg/dL (ref 0–99)
Total CHOL/HDL Ratio: 4.8 RATIO
Triglycerides: 187 mg/dL — ABNORMAL HIGH (ref <150)
VLDL: 37 mg/dL (ref 0–40)

## 2015-02-02 LAB — CREATININE, URINE, RANDOM: Creatinine, Urine: 87.97 mg/dL

## 2015-02-02 LAB — GLUCOSE, CAPILLARY
GLUCOSE-CAPILLARY: 155 mg/dL — AB (ref 65–99)
GLUCOSE-CAPILLARY: 188 mg/dL — AB (ref 65–99)
Glucose-Capillary: 236 mg/dL — ABNORMAL HIGH (ref 65–99)
Glucose-Capillary: 156 mg/dL — ABNORMAL HIGH (ref 65–99)

## 2015-02-02 LAB — LIPASE, BLOOD: Lipase: 44 U/L (ref 22–51)

## 2015-02-02 LAB — LACTIC ACID, PLASMA
LACTIC ACID, VENOUS: 2.7 mmol/L — AB (ref 0.5–2.0)
Lactic Acid, Venous: 2.3 mmol/L (ref 0.5–2.0)
Lactic Acid, Venous: 1.7 mmol/L (ref 0.5–2.0)

## 2015-02-02 LAB — ABO/RH: ABO/RH(D): O POS

## 2015-02-02 LAB — APTT: aPTT: 29 seconds (ref 24–37)

## 2015-02-02 LAB — PROTIME-INR
INR: 1.21 (ref 0.00–1.49)
PROTHROMBIN TIME: 15.4 s — AB (ref 11.6–15.2)

## 2015-02-02 LAB — BILIRUBIN, DIRECT: BILIRUBIN DIRECT: 1.1 mg/dL — AB (ref 0.1–0.5)

## 2015-02-02 MED ORDER — SODIUM CHLORIDE 0.9 % IV SOLN
INTRAVENOUS | Status: DC
  Administered 2015-02-02 – 2015-02-03 (×3): via INTRAVENOUS

## 2015-02-02 MED ORDER — ONDANSETRON HCL 4 MG/2ML IJ SOLN: 4.0000 mg | Freq: Three times a day (TID) | INTRAMUSCULAR | Status: DC | PRN

## 2015-02-02 MED ORDER — VANCOMYCIN HCL 10 G IV SOLR
1500.0000 mg | Freq: Once | INTRAVENOUS | Status: DC
  Filled 2015-02-02: qty 1500

## 2015-02-02 MED ORDER — VANCOMYCIN HCL IN DEXTROSE 750-5 MG/150ML-% IV SOLN
750.0000 mg | Freq: Two times a day (BID) | INTRAVENOUS | Status: DC
  Filled 2015-02-02: qty 150

## 2015-02-02 MED ORDER — SODIUM CHLORIDE 0.9 % IV BOLUS (SEPSIS)
1000.0000 mL | Freq: Once | INTRAVENOUS | Status: AC
  Administered 2015-02-02: 1000 mL via INTRAVENOUS

## 2015-02-02 MED ORDER — PIPERACILLIN-TAZOBACTAM 3.375 G IVPB
3.3750 g | Freq: Three times a day (TID) | INTRAVENOUS | Status: DC
  Administered 2015-02-02 – 2015-02-05 (×10): 3.375 g via INTRAVENOUS
  Filled 2015-02-02 (×11): qty 50

## 2015-02-02 MED ORDER — PIPERACILLIN-TAZOBACTAM 3.375 G IVPB 30 MIN
3.3750 g | Freq: Once | INTRAVENOUS | Status: AC
  Administered 2015-02-02: 3.375 g via INTRAVENOUS
  Filled 2015-02-02: qty 50

## 2015-02-02 MED ORDER — PIPERACILLIN-TAZOBACTAM IN DEX 2-0.25 GM/50ML IV SOLN: 2.2500 g | Freq: Once | INTRAVENOUS | Status: DC

## 2015-02-02 MED ORDER — ATORVASTATIN CALCIUM 40 MG PO TABS: 40.0000 mg | ORAL_TABLET | Freq: Every day | ORAL | Status: DC

## 2015-02-02 MED ORDER — MORPHINE SULFATE 2 MG/ML IJ SOLN: 2.0000 mg | INTRAMUSCULAR | Status: DC | PRN

## 2015-02-02 MED ORDER — LORAZEPAM 2 MG/ML IJ SOLN
1.0000 mg | Freq: Once | INTRAMUSCULAR | Status: AC
  Administered 2015-02-02: 1 mg via INTRAVENOUS
  Filled 2015-02-02: qty 1

## 2015-02-02 MED ORDER — PANTOPRAZOLE SODIUM 40 MG PO TBEC
40.0000 mg | DELAYED_RELEASE_TABLET | Freq: Every day | ORAL | Status: DC
  Administered 2015-02-02 – 2015-02-04 (×3): 40 mg via ORAL
  Filled 2015-02-02 (×3): qty 1

## 2015-02-02 MED ORDER — HYDRALAZINE HCL 20 MG/ML IJ SOLN: 5.0000 mg | INTRAMUSCULAR | Status: DC | PRN

## 2015-02-02 MED ORDER — INSULIN ASPART 100 UNIT/ML ~~LOC~~ SOLN
0.0000 [IU] | Freq: Three times a day (TID) | SUBCUTANEOUS | Status: DC
  Administered 2015-02-02: 3 [IU] via SUBCUTANEOUS
  Administered 2015-02-02 (×3): 2 [IU] via SUBCUTANEOUS
  Administered 2015-02-03: 1 [IU] via SUBCUTANEOUS
  Administered 2015-02-03: 2 [IU] via SUBCUTANEOUS
  Administered 2015-02-03: 1 [IU] via SUBCUTANEOUS
  Administered 2015-02-04: 2 [IU] via SUBCUTANEOUS
  Administered 2015-02-04 (×2): 1 [IU] via SUBCUTANEOUS
  Administered 2015-02-05: 3 [IU] via SUBCUTANEOUS
  Administered 2015-02-05 – 2015-02-06 (×2): 1 [IU] via SUBCUTANEOUS
  Administered 2015-02-06: 2 [IU] via SUBCUTANEOUS

## 2015-02-02 MED ORDER — ACETAMINOPHEN 325 MG PO TABS
650.0000 mg | ORAL_TABLET | Freq: Four times a day (QID) | ORAL | Status: DC | PRN
  Administered 2015-02-02 (×2): 650 mg via ORAL
  Filled 2015-02-02 (×2): qty 2

## 2015-02-02 NOTE — Progress Notes (Addendum)
ANTIBIOTIC CONSULT NOTE - INITIAL  Pharmacy Consult for Vancomycin Indication: R/o Intra-abdominal infection    Allergies  Allergen Reactions  . Allopurinol     hepatotoxicity    Patient Measurements: Height: 5\' 10"  (177.8 cm) Weight: 182 lb (82.555 kg) IBW/kg (Calculated) : 73  Vital Signs: Temp: 98.2 F (36.8 C) (08/11 2143) BP: 120/73 mmHg (08/11 2230) Pulse Rate: 90 (08/11 2230) Intake/Output from previous day:   Intake/Output from this shift:    Labs:  Recent Labs  02/01/15 2148  WBC 10.1  HGB 13.3  PLT 183  CREATININE 1.53*   Estimated Creatinine Clearance: 42.4 mL/min (by C-G formula based on Cr of 1.53). No results for input(s): VANCOTROUGH, VANCOPEAK, VANCORANDOM, GENTTROUGH, GENTPEAK, GENTRANDOM, TOBRATROUGH, TOBRAPEAK, TOBRARND, AMIKACINPEAK, AMIKACINTROU, AMIKACIN in the last 72 hours.   Microbiology: No results found for this or any previous visit (from the past 720 hour(s)).  Medical History: Past Medical History  Diagnosis Date  . Hypertension   . Gout July 2013  . 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   . Ascending cholangitis 2006  . Cholecystitis, acute with cholelithiasis 2006    Medications:   (Not in a hospital admission) Assessment: 27 YOM with h/o ascending cholangitis, cholecystitis and HTN presented to the ED with epigastric pain that started ~ 5 hours ago. WBC is wnl and patient is afebrile. LA elevated at 2.31. Pharmacy consulted to start IV Vancomycin for r/o intra-abdominal infection. He has already received a dose of IV Zosyn. SCr elevated at 1.53. CrCl ~ 42 mL/min   Goal of Therapy:  Vancomycin trough level 15-20 mcg/ml  Plan:  -Give vancomycin 1500 mg IV load followed by Vanc 750 mg IV Q 12 hours -Monitor CBC, renal fx, and clinical progress -[facility] at [place], PharmD., BCPS Clinical Pharmacist Pager [phone]  Addendum: Now stopping Vancomycin and continuing  Zosyn for infection in biliary system. Start Zosyn 3.375 gm IV Q 8 hours.   Albertina Parr, PharmD., BCPS Clinical Pharmacist Pager [phone]

## 2015-02-02 NOTE — Consult Note (Signed)
[facility] Consult: 8:15 AM 02/02/2015  LOS: 0 days    Referring Provider: Dr Broadus John  Primary Care Physician:  Merrilee Seashore, MD Primary Gastroenterologist: Dr Lajoyce Corners, then Dr. Ardis Hughs     Reason for Consultation:  Abdominal pain, elevated LFTs and new cystic lesion in pancreas.    HPI: Jesse Hoffman is a 79 y.o. male.  Hx: DM 2, previously on no medications..  Gout. Thrombocytopenia.  CKD 3 (GFR as low as 37 in 12/2011).  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  Cholangitis in 2006, s/p cholecystectomy and ERCP/shincterotomy 06/2004.  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   .  12/2011 MRCP, for abnml LFTs:  S/p cholecystectomy.  Normal biliary tree. Stable pneumobilia likely due to prior sphincterotomy. Liver normal.  09/2012 Colonoscopy, screening study: 5 polyps removed (cecum, descending, sigmoid).  All tubular adenomas, no HGD.  Surveillance due 09/2015.  06/2004 ERCP, sphincterotomy, stone removal.  Dr Lajoyce Corners.  2004 Colonoscopy : Hemorrhoids, diverticulosis.  2004 EGD: unremarkable.  Pt admitted overnight after sudden onset epigastric pain scoring 8/10 at 1730 last night.  Had eaten pimento cheese sandwich and yogurt at 1 PM.  Had had routine office visit with PMD that morning and was given a prescription for 2 oral diabetic agents which she was advised to start next week, after he returned from an out-of-town visit.  Non-radiating, worsened with movement.  Denies sweats, chills.  No fever initially but t max of 100 since arrival.  BPs initially ok bu lows of 80s/40s and pulse in low 100s since then.    WBCs normal.  Lipase 74.  t bili 3.1(2 at arrival).  Alk phos 166 (191 at arrival), AST/ALT 1337/1093 (563/333 at arrival). AKI (23/1.8).   Platelets 145 (100 in 2013).  Coags not significantly deranged. Empiric Zosyn initiated.  CT Abd/Pelvis 02/01/15: Chronic pneumobilia increased c/w 2013.  8 mm cystic lesion in proximal body of pancreas with associated calcification. Diverticulosis. Renal cysts.  Calcifacation of aorta and branches.  Prostamegaly.  Abd [place] 02/02/15: s/p chole. 4.8 mm CBD. Unremarkable,normal, but incompletely visd, pancreas,    Pain resolved after 35m Morphine in ED and has not returned. Upper abdomen feels tight/bloated.  Last BM was earlier yesterday.  No dark urine or pruritus.  Describes rare episodes, maybe 2 x year of n/v and weakness lasting up to 24 hours, last episode was in early 11/2014.  Also rare episodes of epigastric discomfort relieved with bismuth, no regular heartburn.  Weight and appetite stable.  Active, aerobic and weight exercise at gym 3 x weekly.  Small glass of wine perhaps 2 to 3 x per week.     Past Medical History  Diagnosis Date  . Hypertension   . Gout July 2013  . Hepatitis B antibody positive     [place] hep b test[place]   . Ascending cholangitis 2006  . Cholecystitis, acute with cholelithiasis 2006  . Diabetes mellitus without complication   . HLD (hyperlipidemia)   . GERD (  gastroesophageal reflux disease)   . CKD (chronic kidney disease), stage III     Past Surgical History  Procedure Laterality Date  . Laparoscopic cholecystectomy  06/2004    dr Zella Richer  . Ercp w/ sphicterotomy  06/2004     Ampulla in a diverticulum with multiple stones, found in the  common bile duct    Prior to Admission medications   Medication Sig Start Date End Date Taking? Authorizing Provider  atorvastatin (LIPITOR) 40 MG tablet Take 40 mg by mouth daily at 6 PM.  10/05/12  Yes Historical Provider, MD  lisinopril (PRINIVIL,ZESTRIL) 30 MG tablet Take 30 mg by mouth daily.   Yes Historical Provider, MD  losartan (COZAAR) 50 MG tablet Take 50 mg by mouth daily.   Yes  Historical Provider, MD  metFORMIN (GLUCOPHAGE-XR) 500 MG 24 hr tablet Take 500 mg by mouth daily with breakfast.   Yes Historical Provider, MD  omeprazole (PRILOSEC) 20 MG capsule Take 20 mg by mouth daily.   Yes Historical Provider, MD  triamterene-hydrochlorothiazide (MAXZIDE) 75-50 MG per tablet Take 1 tablet by mouth daily.   Yes Historical Provider, MD    Scheduled Meds: . atorvastatin  40 mg Oral q1800  . insulin aspart  0-9 Units Subcutaneous TID WC  . pantoprazole  40 mg Oral Daily  . piperacillin-tazobactam (ZOSYN)  IV  3.375 g Intravenous Q8H   Infusions: . sodium chloride 100 mL/hr at 02/02/15 0351   PRN Meds: hydrALAZINE, morphine injection, ondansetron (ZOFRAN) IV   Allergies as of 02/01/2015 - Review Complete 02/01/2015  Allergen Reaction Noted  . Allopurinol  01/22/2012    Family History  Problem Relation Age of Onset  . Multiple sclerosis Sister   . Emphysema Father   . Heart attack Father   . Heart disease Mother   . Colon cancer Neg Hx     Social History   Social History  . Marital Status: Married    Spouse Name: N/A  . Number of Children: 2  . Years of Education: N/A   Occupational History  . retired    Social History Main Topics  . Smoking status: Former Smoker    Types: Cigarettes    Quit date: 09/21/1963  . Smokeless tobacco: Never Used  . Alcohol Use: Yes     Comment: a glass of wine every 3 days  . Drug Use: No  . Sexual Activity: Not on file   Other Topics Concern  . Not on file   Social History Narrative    REVIEW OF SYSTEMS: Constitutional:  Per HPI ENT:  No nose bleeds Pulm:  No DOB or cough.  Not a smoker CV:  No palpitations, no LE edema.  GU:  No hematuria, no frequency GI:  Per HPI Heme:  No issues with excessive bleeding or bruising   Transfusions:  none Neuro:  No headaches, no peripheral tingling or numbness Derm:  No itching, no rash or sores.  Endocrine:  No sweats or chills.  No polyuria or  dysuria Immunization:  Not queried.  Travel:  None beyond local counties in last few months.    PHYSICAL EXAM: Vital signs in last 24 hours: Filed Vitals:   02/02/15 0748  BP: 90/51  Pulse: 76  Temp:   Resp:    Wt Readings from Last 3 Encounters:  02/02/15 181 lb 10.5 oz (82.4 kg)  10/13/12 184 lb (83.462 kg)  09/20/12 184 lb 12.8 oz (83.825 kg)    General: Patient looks well. Healthy. Comfortable  Head:  No facial asymmetry, swelling or signs of head trauma.  Eyes:  No scleral icterus, no conjunctival pallor. EOMI. Ears:  No obvious hearing deficit  Nose:  No congestion or discharge. Mouth:  Clear, moist. Dentition in good repair. Tongue midline. Neck:  No JVD, no TMG, no masses. Lungs:  Excellent breath sounds. Clear to auscultation bilaterally. No cough. No dyspnea. Heart: RRR. No MRG. S1/S2 audible. Abdomen:  Soft, active bowel sounds. Nontender, nondistended. No HSM. No palpable masses..   Rectal: Deferred Musc/Skeltl: No joint swelling, redness or deformity. Extremities:   no CCE.  Neurologic:   fully alert, oriented 3. No limb weakness, no tremor. Grossly nonfocal. Skin:   no telangiectasia, no jaundice. Tattoos:   none Nodes:   no cervical adenopathy.   Psych:   pleasant, calm, appropriate.      LAB RESULTS:  Recent Labs  02/01/15 2148 02/02/15 0444  WBC 10.1 7.1  HGB 13.3 11.9*  HCT 38.1* 35.0*  PLT 183 145*   BMET Lab Results  Component Value Date   NA 137 02/02/2015   NA 137 02/01/2015   NA 140 01/23/2012   K 3.6 02/02/2015   K 3.8 02/01/2015   K 3.6 01/23/2012   CL 103 02/02/2015   CL 99* 02/01/2015   CL 106 01/23/2012   CO2 24 02/02/2015   CO2 26 02/01/2015   CO2 25 01/23/2012   GLUCOSE 239* 02/02/2015   GLUCOSE 228* 02/01/2015   GLUCOSE 104* 01/23/2012   BUN 23* 02/02/2015   BUN 23* 02/01/2015   BUN 9 01/23/2012   CREATININE 1.86* 02/02/2015   CREATININE 1.53* 02/01/2015   CREATININE 1.27 01/23/2012   CALCIUM 8.2* 02/02/2015    CALCIUM 9.2 02/01/2015   CALCIUM 8.2* 01/23/2012   LFT  Recent Labs  02/01/15 2148 02/02/15 0444  PROT 7.1 6.0*  ALBUMIN 4.1 3.4*  AST 563* 1337*  ALT 333* 1093*  ALKPHOS 191* 166*  BILITOT 2.0* 3.1*  BILIDIR  --  1.1*   PT/INR Lab Results  Component Value Date   INR 1.21 02/02/2015   INR 1.42 01/19/2012   Lipase     Component Value Date/Time   LIPASE 74* 02/01/2015 2148    RADIOLOGY STUDIES:  US IMPRESSION: 1. Cholecystectomy. No biliary distention. 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 2. No focal abnormality noted in the visualized portions of the pancreas. Reference however is made to prior CT report of 02/01/2015 which demonstrated tiny pancreatic lesion. 3. Bilateral nonobstructing tiny renal caliceal stones.  CT abd/pelvis IMPRESSION: 1. Diffuse pneumobilia noted, more prominent than in 2013 but apparently chronic in nature. This likely reflects prior instrumentation at the duodenal ampulla. 2. New 8 mm cystic focus at the proximal body of the pancreas, with minimal adjacent calcification. This is of uncertain significance. MRCP could be considered for further evaluation, on an elective nonemergent basis. 3. Scattered bilateral renal cysts seen. 4. Scattered calcification along the abdominal aorta and its branches. 5. Scattered diverticulosis along the ascending and proximal transverse colon, and along the sigmoid colon, without evidence of diverticulitis. 6. Enlarged prostate noted. ENDOSCOPIC STUDIES: Per HPI.   IMPRESSION:   *  Abdominal pain.  Elevated LFTs, current numbers more consistent with a shock liver/acute liver injury type situation.  Slight elevation of lipase so ? pancreatitis.  New cystic lesion in pancreas.  *  S/p lap chole, s/p ERCP/shincterotomy/stone extraction both in 2006.  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  Marland Kitchen    *  Type II DM. Patient was to start 2 new oral agents within the  next few days.  *  AKI.  CKD.      PLAN:     *  MRCP without contrast.     Azucena Freed  02/02/2015, 8:15 AM Pager: [place] Attending  I have also seen and assessed the patient and agree with the advanced practitioner's assessment and plan. Cause of epigastric pain and marked transaminitis unclear. Will see if MRCP shed more light - Hx c/w CBD stone but none on imaging so far.  If MRCP unrevealing re: CBD stones I would stop Zosyn  Gatha Mayer, MD, [facility] [phone] (pager) 02/02/2015 10:58 AM

## 2015-02-02 NOTE — Care Management Note (Signed)
Case Management Note  Patient Details  Name: CHRISTAN DEFRANCO MRN: [id number] Date of Birth: 1936/04/08  Subjective/Objective:    Patient is from home with spouse, pta indep.  NCM will cont to follow for dc needs.                Action/Plan:   Expected Discharge Date:  02/04/15               Expected Discharge Plan:  Home/Self Care  In-House Referral:     Discharge planning Services  CM Consult  Post Acute Care Choice:    Choice offered to:     DME Arranged:    DME Agency:     HH Arranged:    HH Agency:     Status of Service:  In process, will continue to follow  Medicare Important Message Given:    Date Medicare IM Given:    Medicare IM give by:    Date Additional Medicare IM Given:    Additional Medicare Important Message give by:     If discussed at [place] of Stay Meetings, dates discussed:    Additional Comments:  Zenon Mayo, RN 02/02/2015, 2:03 PM

## 2015-02-02 NOTE — Progress Notes (Signed)
Occupational Therapy Discharge Patient Details Name: KAYSIN BROCK MRN: [id number] DOB: 1936-02-13 Today's Date: 02/02/2015 Time:  -     Patient discharged from OT services secondary to no acute needs at this time. Spoke with PT [place] to confirm.  Please see latest therapy progress note for current level of functioning and progress toward goals.    Progress and discharge plan discussed with patient and/or caregiver: Patient/Caregiver agrees with plan  GO     Vonita Moss   OTR/L Pager: [phone] Office: [phone] .  02/02/2015, 9:36 AM

## 2015-02-02 NOTE — Progress Notes (Signed)
Attempted to call ED nurse for report. ED nurse is to call back.

## 2015-02-02 NOTE — Progress Notes (Signed)
Utilization review completed. Calina Patrie, RN, BSN. 

## 2015-02-02 NOTE — Progress Notes (Signed)
CRITICAL VALUE ALERT  Critical value received: Lactic acid 2.3  Date of notification:  02/02/15  Time of notification:  0950  Critical value read back:Yes.    Nurse who received alert:  Riccardo Dubin, RN  MD notified (1st page):  Dr. Broadus John  Time of first page:  1000  Responding MD:  Dr Broadus John  Time MD responded:  1000

## 2015-02-02 NOTE — Progress Notes (Signed)
Pt seen and examined, admitted this am pls see H&P for details, 78/M with h/o ascending cholangitis, cholecystitis, s/p of ERCP sphincterectomy and cholecystectomy, diabetes mellitus admitted with abd pain and elevated LFTs Gi consulted, possible passed stone, MRCP today Fu hepatitis panel Continue IV zosyn today  Domenic Polite, MD [phone]

## 2015-02-02 NOTE — H&P (Addendum)
[facility] History and Physical  SHELBY ANDERLE EYC:[id number] DOB: 12/21/1935 DOA: 02/01/2015  Referring physician: ED physician PCP: Merrilee Seashore, MD  Specialists:   Chief Complaint: Abdominal pain  HPI: Jesse Hoffman is a 79 y.o. male with PMH of ascending cholangitis, cholecystitis, s/p of ERCP sphincterectomy and cholecystectomy, hypertension, hyperlipidemia, diet diabetes mellitus, GERD, gout, CKD-III, who presents with abdominal pain.  Patient reports that he started having abdominal pain at 5:30 PM. It is located in epigastric area, constant, 8 out of 10 in severity, nonradiating. It is aggravated by movement. It is not associated with nausea, vomiting or diarrhea. No fever, but has chills. Patient does not have symptoms of UTI, cough, chest pain, shortness breath, unilateral weakness.  In ED, patient was found to have WBC 10.1, elevated lactate 2.31, lipase 74, negative urinalysis, no tachycardia, normal temperature, worsening renal function, abnormal liver function with ALP 191, AST 563, ALT 333 and total bilirubin 2.0. CT-abd/pelvis showed diffuse pneumobilia noted, more prominent than in 2013 but apparently chronic in nature. This likely reflects prior instrumentation at the duodenal ampulla, new 8 mm cystic focus at the proximal body of the pancreas, with minimal adjacent calcification with uncertain significance, scattered bilateral renal cysts seen, enlarged prostate. Patient is admitted to inpatient for further evaluation and treatment.  Where does patient live?   At home   Can patient participate in ADLs?  Yes      Review of Systems:   General: no fevers, chills, no changes in body weight, has fatigue HEENT: no blurry vision, hearing changes or sore throat Pulm: no dyspnea, coughing, wheezing CV: no chest pain, palpitations Abd: no nausea, vomiting, has abdominal pain, no diarrhea, constipation GU: no dysuria, burning on urination, increased urinary  frequency, hematuria  Ext: no leg edema Neuro: no unilateral weakness, numbness, or tingling, no vision change or hearing loss Skin: no rash MSK: No muscle spasm, no deformity, no limitation of range of movement in spin Heme: No easy bruising.  Travel history: No recent long distant travel.  Allergy:  Allergies  Allergen Reactions  . Allopurinol     hepatotoxicity    Past Medical History  Diagnosis Date  . Hypertension   . Gout July 2013  . Hepatitis B antibody positive     false positive hep b test, evaluated at [place].   . Ascending cholangitis 2006  . Cholecystitis, acute with cholelithiasis 2006  . Diabetes mellitus without complication   . HLD (hyperlipidemia)   . GERD (gastroesophageal reflux disease)   . CKD (chronic kidney disease), stage III     Past Surgical History  Procedure Laterality Date  . Laparoscopic cholecystectomy  06/2004    dr Zella Richer  . Ercp w/ sphicterotomy  06/2004     Ampulla in a diverticulum with multiple stones, found in the  common bile duct    Social History:  reports that he quit smoking about 51 years ago. His smoking use included Cigarettes. He has never used smokeless tobacco. He reports that he drinks alcohol. He reports that he does not use illicit drugs.  Family History:  Family History  Problem Relation Age of Onset  . Multiple sclerosis Sister   . Emphysema Father   . Heart attack Father   . Heart disease Mother   . Colon cancer Neg Hx      Prior to Admission medications   Medication Sig Start Date End Date Taking? Authorizing Provider  atorvastatin (LIPITOR) 40 MG tablet Take 40 mg by mouth daily  at 6 PM.  10/05/12  Yes Historical Provider, MD  lisinopril (PRINIVIL,ZESTRIL) 30 MG tablet Take 30 mg by mouth daily.   Yes Historical Provider, MD  losartan (COZAAR) 50 MG tablet Take 50 mg by mouth daily.   Yes Historical Provider, MD  metFORMIN (GLUCOPHAGE-XR) 500 MG 24 hr tablet Take 500 mg by mouth daily with breakfast.   Yes  Historical Provider, MD  omeprazole (PRILOSEC) 20 MG capsule Take 20 mg by mouth daily.   Yes Historical Provider, MD  triamterene-hydrochlorothiazide (MAXZIDE) 75-50 MG per tablet Take 1 tablet by mouth daily.   Yes Historical Provider, MD    Physical Exam: Filed Vitals:   02/01/15 2143 02/01/15 2145 02/01/15 2230  BP: 143/86  120/73  Pulse: 74  90  Temp: 98.2 F (36.8 C)    Resp: 20  23  Height:  5\' 10"  (1.778 m)   Weight:  82.555 kg (182 lb)   SpO2: 98%  98%   General: Not in acute distress HEENT:       Eyes: PERRL, EOMI, no scleral icterus.       ENT: No discharge from the ears and nose, no pharynx injection, no tonsillar enlargement.        Neck: No JVD, no bruit, no mass felt. Heme: No neck lymph node enlargement. Cardiac: S1/S2, RRR, No murmurs, No gallops or rubs. Pulm:  No rales, wheezing, rhonchi or rubs. Abd: Soft, nondistended, tenderness over epigastric area, no rebound pain, no organomegaly, BS present. Ext: No pitting leg edema bilaterally. 2+DP/PT pulse bilaterally. Musculoskeletal: No joint deformities, No joint redness or warmth, no limitation of ROM in spin. Skin: No rashes.  Neuro: Alert, oriented X3, cranial nerves II-XII grossly intact, muscle strength 5/5 in all extremities, sensation to light touch intact.  Psych: Patient is not psychotic, no suicidal or hemocidal ideation.  Labs on Admission:  Basic Metabolic Panel:  Recent Labs Lab 02/01/15 2148  NA 137  K 3.8  CL 99*  CO2 26  GLUCOSE 228*  BUN 23*  CREATININE 1.53*  CALCIUM 9.2   Liver Function Tests:  Recent Labs Lab 02/01/15 2148  AST 563*  ALT 333*  ALKPHOS 191*  BILITOT 2.0*  PROT 7.1  ALBUMIN 4.1    Recent Labs Lab 02/01/15 2148  LIPASE 74*   No results for input(s): AMMONIA in the last 168 hours. CBC:  Recent Labs Lab 02/01/15 2148  WBC 10.1  HGB 13.3  HCT 38.1*  MCV 89.2  PLT 183   Cardiac Enzymes: No results for input(s): CKTOTAL, CKMB, CKMBINDEX,  TROPONINI in the last 168 hours.  BNP (last 3 results) No results for input(s): BNP in the last 8760 hours.  ProBNP (last 3 results) No results for input(s): PROBNP in the last 8760 hours.  CBG: No results for input(s): GLUCAP in the last 168 hours.  Radiological Exams on Admission: Ct Abdomen Pelvis W Contrast  02/02/2015   CLINICAL DATA:  Acute onset of epigastric abdominal pain and nausea. Initial encounter.  EXAM: CT ABDOMEN AND PELVIS WITH CONTRAST  TECHNIQUE: Multidetector CT imaging of the abdomen and pelvis was performed using the standard protocol following bolus administration of intravenous contrast.  CONTRAST:  30mL OMNIPAQUE IOHEXOL 300 MG/ML  SOLN  COMPARISON:  CT of the abdomen and pelvis performed 01/20/2012, abdominal ultrasound performed 01/19/2012, and MRCP performed 01/21/2012  FINDINGS: Minimal bibasilar atelectasis is noted.  Diffuse pneumobilia is noted. This likely reflects prior instrumentation at the duodenal ampulla. The patient is status post  cholecystectomy. The liver and spleen are otherwise grossly unremarkable. The adrenal glands are grossly unremarkable. An 8 mm cystic focus is noted at the proximal body of the pancreas, with minimal adjacent calcification. This is of uncertain significance.  Scattered bilateral renal cysts are seen. Nonspecific perinephric stranding is noted bilaterally. The kidneys are otherwise unremarkable in appearance. There is no evidence of hydronephrosis. No renal or ureteral stones are identified. Mild prominence of the left ureter is thought remain within normal limits.  No free fluid is identified. The small bowel is unremarkable in appearance. The stomach is within normal limits. No acute vascular abnormalities are seen. Scattered calcification is noted along the abdominal aorta and its branches.  The appendix is normal in caliber and contains air, without evidence of appendicitis. Scattered diverticulosis is noted along the ascending and  proximal transverse colon, and along the sigmoid colon, without evidence of diverticulitis.  The bladder is mildly distended and grossly unremarkable. A small urachal remnant is incidentally noted. An enlarged prostate is noted, measuring 6.3 cm in transverse dimension. No inguinal lymphadenopathy is seen.  No acute osseous abnormalities are identified.  IMPRESSION: 1. Diffuse pneumobilia noted, more prominent than in 2013 but apparently chronic in nature. This likely reflects prior instrumentation at the duodenal ampulla. 2. New 8 mm cystic focus at the proximal body of the pancreas, with minimal adjacent calcification. This is of uncertain significance. MRCP could be considered for further evaluation, on an elective nonemergent basis. 3. Scattered bilateral renal cysts seen. 4. Scattered calcification along the abdominal aorta and its branches. 5. Scattered diverticulosis along the ascending and proximal transverse colon, and along the sigmoid colon, without evidence of diverticulitis. 6. Enlarged prostate noted.   Electronically Signed   By: Garald Balding M.D.   On: 02/02/2015 00:22   Dg Abd Acute W/chest  02/02/2015   CLINICAL DATA:  Abdominal pain and nausea  EXAM: DG ABDOMEN ACUTE W/ 1V CHEST  COMPARISON:  Chest radiograph January 19, 2012; CT abdomen and pelvis January 20, 2012  FINDINGS: PA chest: No edema or consolidation. Heart size and pulmonary vascularity are normal. No adenopathy. There is atherosclerotic change in the aorta.  Supine and upright abdomen: There is moderate stool throughout the colon. There is no bowel dilatation or air-fluid level suggesting obstruction. No free air. There are surgical clips in the right upper quadrant.  IMPRESSION: Bowel gas pattern unremarkable.  No edema or consolidation.   Electronically Signed   By: Lowella Grip III M.D.   On: 02/02/2015 00:18    EKG:   Not done in ED, will get one.   Assessment/Plan Principal Problem:   Abdominal pain Active Problems:    Elevated liver enzymes   Hypertension   Diabetes mellitus without complication   HLD (hyperlipidemia)   GERD (gastroesophageal reflux disease)   Acute renal failure superimposed on stage 3 chronic kidney disease  Abdominal pain: Seems to be due to biliary obstruction given abnormal liver function and elevated TB. Patient meets the criteria for sepsis with elevated lactate and RR>20, hemodynamically stable. GI was consulted by ED.   -will admit to tele bed -ED started Vancomycin and Zosyn, will d/c Vancomycin and continue Zosyn -will get Procalcitonin and trend lactic acid levels per sepsis protocol. -IVF: 2L of NS bolus in ED, followed by 100 cc/h -prn Zofran for nausea, morphine for pain -INR/PTT/type & screen -total BR, hepatitis panel -US-abd  Hypertension: -hold Triamteren and lisinopril given AoCKD-III -IV hydralazine when necessary  Diet-controlled diabetes: No  A1c on record. Not taking meds at home. Blood sugar is 228 on admission -SSI -A1c  HLD: Last LDL was not on record -Continue home medications: Lipitor -Check FLP  GERD: -Protonix  AoCKD-III: Baseline Cre is 1.3, his Cre is 1.53 and BUN 23 on admission. Likely due to prerenal secondary to dehydration and continuation of ACEI and diuretics. No hydronephrosis by CT abdomen/pelvis - IVF as above - Check FeUrea - Follow up renal function by BMP - hold ACEI and Diuretics   DVT ppx: SCD Code Status: Full code Family Communication:   Yes, patient's wife  at bed side Disposition Plan: Admit to inpatient   Date of Service 02/02/2015    Jesse Hoffman [facility] Pager [phone]  If 7PM-7AM, please contact night-coverage [email or web address] Password [email or web address] 02/02/2015, 1:25 AM

## 2015-02-02 NOTE — Progress Notes (Signed)
Received report on patient from [facility] ED nurse.

## 2015-02-02 NOTE — Progress Notes (Signed)
Admission Note  Patient arrived to floor in room [phone] via bed from ED. Patient alert and oriented X 4. Vitals signs  Oral temperature 100 F       Blood pressure 99/52       Pulse 97       RR 18       SpO2 95 % on room air. Complains of constant pain 3/10 over upper abdomen. Skin intact, no pressure ulcer noted in sacral area. Telemetry monitor placed.  Patient's ID armband verified with patient/ family, and in place. Information packet given to patient/ family. Fall risk assessed, SR up X2, patient/ family able to verbalize understanding of risks associated with falls and to call nurse or staff to assist before getting out of bed. Patient/ family oriented to room and equipment. Call bell within reach.

## 2015-02-02 NOTE — Progress Notes (Signed)
PT Cancellation Note  Patient Details Name: Jesse Hoffman MRN: [id number] DOB: 1935-12-15   Cancelled Treatment:    Reason Eval/Treat Not Completed: PT screened, no needs identified, will sign off. Pt independent.   Marquize Seib 02/02/2015, 9:32 AM  [place]

## 2015-02-02 NOTE — Progress Notes (Signed)
Schorr MD paged and notified regarding patient's critical lab value of Lactic acid 2.7 and patient's BP 92/58 manually after 2,000 mL bolus fluid given.

## 2015-02-02 NOTE — Progress Notes (Signed)
 <  RCP shows suspected CBD stome - will have ERCP by Dr. Benson Norway tomoroww 0900. The risks and benefits as well as alternatives of endoscopic procedure(s) have been discussed and reviewed. All questions answered. The patient agrees to proceed.  Gatha Mayer, MD, [facility] [phone] (pager) 02/02/2015 5:14 PM

## 2015-02-03 ENCOUNTER — Inpatient Hospital Stay (HOSPITAL_COMMUNITY): Payer: Medicare Other | Admitting: Certified Registered Nurse Anesthetist

## 2015-02-03 ENCOUNTER — Encounter (HOSPITAL_COMMUNITY): Payer: Self-pay | Admitting: *Deleted

## 2015-02-03 ENCOUNTER — Encounter (HOSPITAL_COMMUNITY)
Admission: EM | Disposition: A | Discharge status: Home / Self Care | Payer: Self-pay | Source: Home / Self Care | Attending: Internal Medicine

## 2015-02-03 ENCOUNTER — Inpatient Hospital Stay (HOSPITAL_COMMUNITY): Payer: Medicare Other

## 2015-02-03 DIAGNOSIS — R1010 Upper abdominal pain, unspecified: Secondary | ICD-10-CM

## 2015-02-03 DIAGNOSIS — N179 Acute kidney failure, unspecified: Secondary | ICD-10-CM

## 2015-02-03 DIAGNOSIS — E119 Type 2 diabetes mellitus without complications: Secondary | ICD-10-CM

## 2015-02-03 DIAGNOSIS — N183 Chronic kidney disease, stage 3 (moderate): Secondary | ICD-10-CM

## 2015-02-03 HISTORY — PX: ERCP: SHX5425

## 2015-02-03 LAB — COMPREHENSIVE METABOLIC PANEL
ALBUMIN: 3 g/dL — AB (ref 3.5–5.0)
ALT: 577 U/L — AB (ref 17–63)
AST: 310 U/L — AB (ref 15–41)
Alkaline Phosphatase: 118 U/L (ref 38–126)
Anion gap: 7 (ref 5–15)
BUN: 17 mg/dL (ref 6–20)
CHLORIDE: 107 mmol/L (ref 101–111)
CO2: 23 mmol/L (ref 22–32)
Calcium: 7.5 mg/dL — ABNORMAL LOW (ref 8.9–10.3)
Creatinine, Ser: 1.78 mg/dL — ABNORMAL HIGH (ref 0.61–1.24)
GFR calc Af Amer: 41 mL/min — ABNORMAL LOW (ref >60)
GFR, EST NON AFRICAN AMERICAN: 35 mL/min — AB (ref >60)
Glucose, Bld: 147 mg/dL — ABNORMAL HIGH (ref 65–99)
Potassium: 3.5 mmol/L (ref 3.5–5.1)
SODIUM: 137 mmol/L (ref 135–145)
TOTAL PROTEIN: 5.8 g/dL — AB (ref 6.5–8.1)
Total Bilirubin: 5.6 mg/dL — ABNORMAL HIGH (ref 0.3–1.2)

## 2015-02-03 LAB — HEMOGLOBIN A1C
HEMOGLOBIN A1C: 7.2 % — AB (ref 4.8–5.6)
Mean Plasma Glucose: 160 mg/dL

## 2015-02-03 LAB — GLUCOSE, CAPILLARY
GLUCOSE-CAPILLARY: 136 mg/dL — AB (ref 65–99)
GLUCOSE-CAPILLARY: 164 mg/dL — AB (ref 65–99)
Glucose-Capillary: 145 mg/dL — ABNORMAL HIGH (ref 65–99)
Glucose-Capillary: 141 mg/dL — ABNORMAL HIGH (ref 65–99)

## 2015-02-03 LAB — CBC
HCT: 31.7 % — ABNORMAL LOW (ref 39.0–52.0)
HEMOGLOBIN: 10.9 g/dL — AB (ref 13.0–17.0)
MCH: 30.9 pg (ref 26.0–34.0)
MCHC: 34.4 g/dL (ref 30.0–36.0)
MCV: 89.8 fL (ref 78.0–100.0)
PLATELETS: 119 10*3/uL — AB (ref 150–400)
RBC: 3.53 MIL/uL — ABNORMAL LOW (ref 4.22–5.81)
RDW: 13.1 % (ref 11.5–15.5)
WBC: 10 10*3/uL (ref 4.0–10.5)

## 2015-02-03 LAB — UREA NITROGEN, URINE: Urea Nitrogen, Ur: 418 mg/dL

## 2015-02-03 LAB — HEPATITIS PANEL, ACUTE
Hep A IgM: NEGATIVE
Hep B C IgM: POSITIVE — AB
Hepatitis B Surface Ag: NEGATIVE

## 2015-02-03 SURGERY — ERCP, WITH INTERVENTION IF INDICATED
Anesthesia: General

## 2015-02-03 MED ORDER — ZOLPIDEM TARTRATE 5 MG PO TABS
5.0000 mg | ORAL_TABLET | Freq: Once | ORAL | Status: AC
  Administered 2015-02-03: 5 mg via ORAL
  Filled 2015-02-03: qty 1

## 2015-02-03 MED ORDER — OXYCODONE HCL 5 MG/5ML PO SOLN: 5.0000 mg | Freq: Once | ORAL | Status: DC | PRN

## 2015-02-03 MED ORDER — SODIUM CHLORIDE 0.9 % IV SOLN
INTRAVENOUS | Status: DC | PRN
  Administered 2015-02-03: 30 mL

## 2015-02-03 MED ORDER — INDOMETHACIN 50 MG RE SUPP
100.0000 mg | Freq: Once | RECTAL | Status: AC
  Administered 2015-02-03: 100 mg via RECTAL
  Filled 2015-02-03: qty 2

## 2015-02-03 MED ORDER — FENTANYL CITRATE (PF) 100 MCG/2ML IJ SOLN
INTRAMUSCULAR | Status: DC | PRN
  Administered 2015-02-03: 100 ug via INTRAVENOUS

## 2015-02-03 MED ORDER — OXYCODONE HCL 5 MG PO TABS: 5.0000 mg | ORAL_TABLET | Freq: Once | ORAL | Status: DC | PRN

## 2015-02-03 MED ORDER — LIDOCAINE HCL (CARDIAC) 20 MG/ML IV SOLN
INTRAVENOUS | Status: DC | PRN
  Administered 2015-02-03: 50 mg via INTRAVENOUS

## 2015-02-03 MED ORDER — SODIUM CHLORIDE 0.9 % IV SOLN: INTRAVENOUS | Status: DC

## 2015-02-03 MED ORDER — SUCCINYLCHOLINE CHLORIDE 20 MG/ML IJ SOLN
INTRAMUSCULAR | Status: DC | PRN
  Administered 2015-02-03: 100 mg via INTRAVENOUS

## 2015-02-03 MED ORDER — ONDANSETRON HCL 4 MG/2ML IJ SOLN
INTRAMUSCULAR | Status: DC | PRN
  Administered 2015-02-03: 4 mg via INTRAVENOUS

## 2015-02-03 MED ORDER — FENTANYL CITRATE (PF) 250 MCG/5ML IJ SOLN
INTRAMUSCULAR | Status: AC
  Filled 2015-02-03: qty 5

## 2015-02-03 MED ORDER — ONDANSETRON HCL 4 MG/2ML IJ SOLN: 4.0000 mg | Freq: Once | INTRAMUSCULAR | Status: DC | PRN

## 2015-02-03 MED ORDER — FENTANYL CITRATE (PF) 100 MCG/2ML IJ SOLN
25.0000 ug | INTRAMUSCULAR | Status: DC | PRN
Start: 2015-02-03 — End: 2015-02-05

## 2015-02-03 MED ORDER — PROPOFOL 10 MG/ML IV BOLUS
INTRAVENOUS | Status: DC | PRN
  Administered 2015-02-03: 150 mg via INTRAVENOUS

## 2015-02-03 NOTE — Anesthesia Procedure Notes (Signed)
Procedure Name: Intubation Date/Time: 02/03/2015 9:36 AM Performed by: Barrington Ellison Pre-anesthesia Checklist: Patient identified, Emergency Drugs available, Suction available, Patient being monitored and Timeout performed Patient Re-evaluated:Patient Re-evaluated prior to inductionOxygen Delivery Method: Circle system utilized Preoxygenation: Pre-oxygenation with 100% oxygen Intubation Type: IV induction Ventilation: Mask ventilation without difficulty Laryngoscope Size: Mac and 3 Grade View: Grade I Tube type: Oral Tube size: 7.5 mm Number of attempts: 1 Airway Equipment and Method: Stylet Placement Confirmation: ETT inserted through vocal cords under direct vision,  positive ETCO2 and breath sounds checked- equal and bilateral Secured at: 22 cm Tube secured with: Tape Dental Injury: Teeth and Oropharynx as per pre-operative assessment

## 2015-02-03 NOTE — Anesthesia Postprocedure Evaluation (Signed)
  Anesthesia Post-op Note  Patient: Jesse Hoffman  Procedure(s) Performed: Procedure(s): ENDOSCOPIC RETROGRADE CHOLANGIOPANCREATOGRAPHY (ERCP) (N/A)  Patient Location: PACU  Anesthesia Type:General  Level of Consciousness: awake  Airway and Oxygen Therapy: Patient Spontanous Breathing  Post-op Pain: none  Post-op Assessment: Post-op Vital signs reviewed, Patient's Cardiovascular Status Stable, Respiratory Function Stable, Patent Airway and Pain level controlled              Post-op Vital Signs: stable  Last Vitals:  Filed Vitals:   02/03/15 1259  BP: 141/79  Pulse: 68  Temp: 37 C  Resp: 20    Complications: No apparent anesthesia complications

## 2015-02-03 NOTE — H&P (View-Only) (Signed)
 <  RCP shows suspected CBD stome - will have ERCP by Dr. Benson Norway tomoroww 0900. The risks and benefits as well as alternatives of endoscopic procedure(s) have been discussed and reviewed. All questions answered. The patient agrees to proceed.  Gatha Mayer, MD, [facility] [phone] (pager) 02/02/2015 5:14 PM

## 2015-02-03 NOTE — Op Note (Signed)
[facility] [place], [id number]   ERCP PROCEDURE REPORT        EXAM DATE: 02-13-15  PATIENT NAME:          Jesse Hoffman, Jesse Hoffman          MR #: [id number] BIRTHDATE:       12-Feb-1936     VISIT #:     [id number] ATTENDING:     Carol Ada, MD     STATUS:     inpatient ASSISTANT:      Sharon Mt and Tory Emerald  INDICATIONS:  The patient is a 79 yr old male here for an ERCP due to choledocholithiasis. PROCEDURE PERFORMED:     ERCP with stone extraction  MEDICATIONS:     GA  CONSENT: The patient understands the risks and benefits of the procedure and understands that these risks include, but are not limited to: sedation, allergic reaction, infection, perforation and/or bleeding. Alternative means of evaluation and treatment include, among others: physical exam, x-rays, and/or surgical intervention. The patient elects to proceed with this endoscopic procedure.  DESCRIPTION OF PROCEDURE: During intra-op preparation period all mechanical & medical equipment was checked for proper function. Hand hygiene and appropriate measures for infection prevention was taken. After the risks, benefits and alternatives of the procedure were thoroughly explained, Informed was verified, confirmed and timeout was successfully executed by the treatment team. With the patient in left semi-prone position, medications were administered intravenously.The Pentax Ercp Scope [id number] was passed from the mouth into the esophagus and further advanced from the esophagus into the stomach. From stomach scope was directed to the second portion of the duodenum.  Major papilla was aligned with the duodenoscope. The scope position was confirmed fluoroscopically. Rest of the findings/therapeutics are given below. The scope was then completely withdrawn from the patient and the procedure completed. The pulse, BP, and O2 saturation were monitored and documented by  the physician and the nursing staff throughout the entire procedure. The patient was cared for as planned according to standard protocol. The patient was then discharged to recovery in stable condition and with appropriate post procedure care. Estimated blood loss is zero unless otherwise noted in this procedure report.  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  No clear evidence of a prior sphincterotomy.  The CBD was easily cannulated during the inital attempt.  The guidewire was secured in the right intrahepatic ducts.  Contrast injection revealed a large 1 cm stone in the distal CBD.  The CBD was also dilated up to 1 cm as well as some intrahepatic ductal dilation.  Because of the ampulla being located in a large diverticulum, a limited sphincterotomy was created. With the extraction balloon, the brown stone was removed in fragments.  Three subsequent passes with the balloon did not yield any further stones or stone fragments.  During the fourth pass, an occlusion cholangiogram was performed and there was no evidence of any retained stones.  Pus was noted to be draining from the CBD during the sweeps of the CBD with the balloon.    ADVERSE EVENT:     None IMPRESSIONS:     1) Choledocholithiasis s/p successful ERCP. 2) Cholangitis.  RECOMMENDATIONS:     1) Follow liver panel. 2) Continue antibiotics for now. REPEAT EXAM:   ___________________________________ Carol Ada, MD eSigned:  Carol Ada, MD 13-Feb-2015 10:17 AM   cc:  CPT CODES: ICD9 CODES:  The ICD and CPT codes recommended by this software are interpretations from the data that the clinical staff has captured with the software.  The verification of the translation of this report to the ICD and CPT codes and modifiers is the sole responsibility of the health care institution and practicing physician where this report was generated.  [employer] will not be held responsible for the validity of the ICD and CPT codes included on this report.  AMA assumes no liability for data contained or not contained herein. CPT is a Designer, television/film set of the [employer].   PATIENT NAME:  Jesse Hoffman, Jesse Hoffman MR#: [id number]

## 2015-02-03 NOTE — Anesthesia Preprocedure Evaluation (Addendum)
Anesthesia Evaluation  Patient identified by MRN, date of birth, ID band Patient awake    Reviewed: Allergy & Precautions, NPO status , Patient's Chart, lab work & pertinent test results  Airway Mallampati: II  TM Distance: >3 FB Neck ROM: Full    Dental  (+) Teeth Intact, Dental Advisory Given   Pulmonary former smoker,  breath sounds clear to auscultation        Cardiovascular hypertension, Pt. on medications Rhythm:Regular     Neuro/Psych    GI/Hepatic   Endo/Other  diabetes, Type 2  Renal/GU Renal disease     Musculoskeletal   Abdominal   Peds  Hematology   Anesthesia Other Findings   Reproductive/Obstetrics                           Anesthesia Physical Anesthesia Plan  ASA: III  Anesthesia Plan: General   Post-op Pain Management:    Induction: Intravenous  Airway Management Planned: Oral ETT  Additional Equipment:   Intra-op Plan:   Post-operative Plan: Extubation in OR  Informed Consent: I have reviewed the patients History and Physical, chart, labs and discussed the procedure including the risks, benefits and alternatives for the proposed anesthesia with the patient or authorized representative who has indicated his/her understanding and acceptance.   Dental advisory given  Plan Discussed with: CRNA and Anesthesiologist  Anesthesia Plan Comments:         Anesthesia Quick Evaluation

## 2015-02-03 NOTE — Interval H&P Note (Signed)
History and Physical Interval Note:  02/03/2015 9:30 AM  Jesse Hoffman  has presented today for surgery, with the diagnosis of Choledocholithiasis.  The various methods of treatment have been discussed with the patient and family. After consideration of risks, benefits and other options for treatment, the patient has consented to  Procedure(s): ENDOSCOPIC RETROGRADE CHOLANGIOPANCREATOGRAPHY (ERCP) (N/A) as a surgical intervention .  The patient's history has been reviewed, patient examined, no change in status, stable for surgery.  I have reviewed the patient's chart and labs.  Questions were answered to the patient's satisfaction.     Jalei Shibley D

## 2015-02-03 NOTE — Transfer of Care (Signed)
Immediate Anesthesia Transfer of Care Note  Patient: Jesse Hoffman  Procedure(s) Performed: Procedure(s): ENDOSCOPIC RETROGRADE CHOLANGIOPANCREATOGRAPHY (ERCP) (N/A)  Patient Location: PACU  Anesthesia Type:General  Level of Consciousness: awake and patient cooperative  Airway & Oxygen Therapy: Patient Spontanous Breathing and Patient connected to nasal cannula oxygen  Post-op Assessment: Report given to RN  Post vital signs: Reviewed and stable  Last Vitals:  Filed Vitals:   02/03/15 0821  BP: 140/75  Pulse: 77  Temp: 36.9 C  Resp: 20    Complications: No apparent anesthesia complications

## 2015-02-03 NOTE — Progress Notes (Signed)
[facility] PROGRESS NOTE  GARFIELD COINER KWI:[id number] DOB: Mar 02, 1936 DOA: 02/01/2015 PCP: Merrilee Seashore, MD  Assessment/Plan:  Cholangitis -improving, continue IV Zosyn -s/p ERCP and stone extraction -Follow LFTs -resume diet -IVF today  AoCKD-III: Baseline Cre is 1.3 -Likely due to prerenal secondary to dehydration and continuation of ACEI and diuretics.  -continue IVF today, Bmet in am - hold ACEI and Diuretics  Hypertension: -hold Triamteren and lisinopril given AoCKD-III -stable  Diet-controlled diabetes:  -hbiac 7.2, SSI  HLD:  -Continue home medications: Lipitor  GERD: -Protonix  DVT ppx: SCD  Code Status: Full Code Family Communication: wife Disposition Plan: home in 1-2days   Consultants:  GI  Procedures:  ERCP: ERCP with stone extraction  HPI/Subjective: Feels better  Objective: Filed Vitals:   02/03/15 1259  BP: 141/79  Pulse: 68  Temp: 98.6 F (37 C)  Resp: 20    Intake/Output Summary (Last 24 hours) at 02/03/15 1429 Last data filed at 02/03/15 1336  Gross per 24 hour  Intake    120 ml  Output      0 ml  Net    120 ml   Filed Weights   02/02/15 0238 02/02/15 0258 02/03/15 0821  Weight: 82.9 kg (182 lb 12.2 oz) 82.4 kg (181 lb 10.5 oz) 82.101 kg (181 lb)    Exam:   General:  AAOx3  Cardiovascular: S1S2/RRR  Respiratory: CTAB  Abdomen: soft, NT, BS present  Musculoskeletal: no dema c/c  Data Reviewed: Basic Metabolic Panel:  Recent Labs Lab 02/01/15 2148 02/02/15 0444 02/03/15 0746  NA 137 137 137  K 3.8 3.6 3.5  CL 99* 103 107  CO2 26 24 23   GLUCOSE 228* 239* 147*  BUN 23* 23* 17  CREATININE 1.53* 1.86* 1.78*  CALCIUM 9.2 8.2* 7.5*   Liver Function Tests:  Recent Labs Lab 02/01/15 2148 02/02/15 0444 02/03/15 0746  AST 563* 1337* 310*  ALT 333* 1093* 577*  ALKPHOS 191* 166* 118  BILITOT 2.0* 3.1* 5.6*  PROT 7.1 6.0* 5.8*  ALBUMIN 4.1 3.4* 3.0*    Recent Labs Lab  02/01/15 2148 02/02/15 0444  LIPASE 74* 44   No results for input(s): AMMONIA in the last 168 hours. CBC:  Recent Labs Lab 02/01/15 2148 02/02/15 0444 02/03/15 0746  WBC 10.1 7.1 10.0  HGB 13.3 11.9* 10.9*  HCT 38.1* 35.0* 31.7*  MCV 89.2 89.1 89.8  PLT 183 145* 119*   Cardiac Enzymes: No results for input(s): CKTOTAL, CKMB, CKMBINDEX, TROPONINI in the last 168 hours. BNP (last 3 results) No results for input(s): BNP in the last 8760 hours.  ProBNP (last 3 results) No results for input(s): PROBNP in the last 8760 hours.  CBG:  Recent Labs Lab 02/02/15 1214 02/02/15 1703 02/02/15 2141 02/03/15 0740 02/03/15 1140  GLUCAP 155* 188* 156* 145* 136*    Recent Results (from the past 240 hour(s))  Culture, blood (x 2)     Status: None (Preliminary result)   Collection Time: 02/02/15  4:44 AM  Result Value Ref Range Status   Specimen Description BLOOD LEFT ANTECUBITAL  Final   Special Requests BOTTLES DRAWN AEROBIC AND ANAEROBIC 10CC  Final   Culture NO GROWTH 1 DAY  Final   Report Status PENDING  Incomplete  Culture, blood (x 2)     Status: None (Preliminary result)   Collection Time: 02/02/15  5:02 AM  Result Value Ref Range Status   Specimen Description BLOOD BLOOD RIGHT HAND  Final   Special Requests BOTTLES DRAWN  AEROBIC ONLY 3CC  Final   Culture NO GROWTH 1 DAY  Final   Report Status PENDING  Incomplete     Studies: US Abdomen Complete  02/02/2015   CLINICAL DATA:  Abdominal pain.  EXAM: ULTRASOUND ABDOMEN COMPLETE  COMPARISON:  CT 02/01/2015 .  FINDINGS: Gallbladder: Cholecystectomy.  Common bile duct: Diameter: 4.8 mm  Liver: No focal lesion identified. Within normal limits in parenchymal echogenicity.  IVC: No abnormality visualized.  Pancreas: Visualized portion unremarkable.Reference is made to prior CT report of 02/01/2015 which demonstrated at tiny pancreatic lesion.  Spleen: Size and appearance within normal limits.  Right Kidney: Length: 10.9 cm.  Echogenicity within normal limits. No significant mass or hydronephrosis visualized. Multiple small right renal cysts noted as noted on prior CT . Tiny lower pole nonobstructing caliceal stone.  Left Kidney: Length: 11.9 cm. Echogenicity within normal limits. No mass or hydronephrosis visualized. Tiny lower pole nonobstructing caliceal stone.  Abdominal aorta: No aneurysm visualized.  Other findings: None.  IMPRESSION: 1. Cholecystectomy. No biliary distention. 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 2. No focal abnormality noted in the visualized portions of the pancreas. Reference however is made to prior CT report of 02/01/2015 which demonstrated tiny pancreatic lesion. 3. Bilateral nonobstructing tiny renal caliceal stones.   Electronically Signed   By: Marcello Moores  Register   On: 02/02/2015 07:05   Ct Abdomen Pelvis W Contrast  02/02/2015   CLINICAL DATA:  Acute onset of epigastric abdominal pain and nausea. Initial encounter.  EXAM: CT ABDOMEN AND PELVIS WITH CONTRAST  TECHNIQUE: Multidetector CT imaging of the abdomen and pelvis was performed using the standard protocol following bolus administration of intravenous contrast.  CONTRAST:  97mL OMNIPAQUE IOHEXOL 300 MG/ML  SOLN  COMPARISON:  CT of the abdomen and pelvis performed 01/20/2012, abdominal ultrasound performed 01/19/2012, and MRCP performed 01/21/2012  FINDINGS: Minimal bibasilar atelectasis is noted.  Diffuse pneumobilia is noted. This likely reflects prior instrumentation at the duodenal ampulla. The patient is status post cholecystectomy. The liver and spleen are otherwise grossly unremarkable. The adrenal glands are grossly unremarkable. An 8 mm cystic focus is noted at the proximal body of the pancreas, with minimal adjacent calcification. This is of uncertain significance.  Scattered bilateral renal cysts are seen. Nonspecific perinephric stranding is noted bilaterally. The kidneys are otherwise unremarkable in appearance. There is  no evidence of hydronephrosis. No renal or ureteral stones are identified. Mild prominence of the left ureter is thought remain within normal limits.  No free fluid is identified. The small bowel is unremarkable in appearance. The stomach is within normal limits. No acute vascular abnormalities are seen. Scattered calcification is noted along the abdominal aorta and its branches.  The appendix is normal in caliber and contains air, without evidence of appendicitis. Scattered diverticulosis is noted along the ascending and proximal transverse colon, and along the sigmoid colon, without evidence of diverticulitis.  The bladder is mildly distended and grossly unremarkable. A small urachal remnant is incidentally noted. An enlarged prostate is noted, measuring 6.3 cm in transverse dimension. No inguinal lymphadenopathy is seen.  No acute osseous abnormalities are identified.  IMPRESSION: 1. Diffuse pneumobilia noted, more prominent than in 2013 but apparently chronic in nature. This likely reflects prior instrumentation at the duodenal ampulla. 2. New 8 mm cystic focus at the proximal body of the pancreas, with minimal adjacent calcification. This is of uncertain significance. MRCP could be considered for further evaluation, on an elective nonemergent basis. 3. Scattered bilateral renal cysts seen.  4. Scattered calcification along the abdominal aorta and its branches. 5. Scattered diverticulosis along the ascending and proximal transverse colon, and along the sigmoid colon, without evidence of diverticulitis. 6. Enlarged prostate noted.   Electronically Signed   By: Garald Balding M.D.   On: 02/02/2015 00:22   Mr Abdomen Mrcp Wo Cm  02/02/2015   CLINICAL DATA:  Subsequent encounter for 8 mm cystic lesion in the head of the pancreas, new on recent CT scan.  EXAM: MRI ABDOMEN WITHOUT CONTRAST  (INCLUDING MRCP)  TECHNIQUE: Multiplanar multisequence MR imaging of the abdomen was performed. Heavily T2-weighted images of  the biliary and pancreatic ducts were obtained, and three-dimensional MRCP images were rendered by post processing.  COMPARISON:  CT from 02/01/2015.  CT from 01/20/2012.  FINDINGS: Lower chest: Probable dependent atelectasis in the lungs bilaterally.  Hepatobiliary: No focal intraparenchymal abnormality on this study performed without intravenous contrast material. Pneumobilia is evident. The extrahepatic common duct measures up to 11 mm in diameter.  Gallbladder is surgically absent. The extrahepatic common duct measures up to about 11 mm in diameter. Fluid fluid or fluid debris level in the duct is evident. There is a low signal intensity apparent filling defect in the distal common bile duct, at the level of the pancreatic head, just proximal to the ampulla. This measures approximately 7 x 8 mm on image 30 series 7).  Pancreas: 8 mm homogeneous T2 hyperintense lesion is identified in the head of the pancreas. No direct communication to the main pancreatic duct is evident. No associated dilatation of the main pancreatic duct.  Spleen: No splenomegaly. No focal mass lesion.  Adrenals/Urinary Tract: No adrenal nodule or mass. 3.3 cm well-defined homogeneous T1 hypo intense, T2 hyperintense lesion in the interpolar right kidney is compatible with a simple cyst. Other tiny cystic lesions are seen in the right renal parenchyma. 15 mm simple cyst is identified in the interpolar left kidney with other tiny cystic left renal lesions associated. Neither kidney has been fully evaluated on this study.  Stomach/Bowel: Stomach is nondistended. No gastric wall thickening. No evidence of outlet obstruction. Visualize small bowel and colon of the upper abdomen is unremarkable.  Vascular/Lymphatic: There is no gastrohepatic or hepatoduodenal ligament lymphadenopathy. No intraperitoneal or retroperitoneal lymphadenopy.  Other: No abdominal aortic aneurysm. No abdominal atherosclerotic calcification.  Musculoskeletal: No abnormal  marrow signal within the visualized bony anatomy.  IMPRESSION: Mild extrahepatic biliary duct dilatation in this patient status post cholecystectomy. The presence of pneumobilia suggest previous sphincterotomy. On today's exam, there is a 7 x 8 mm filling defect in the distal common bile duct, just proximal to the ampulla. Imaging features are suspicious for a distal common bile duct stone.  8 mm simple cyst identified in the pancreatic head. No communication to the main pancreatic duct is evident. Followup MRI without and with contrast in 12 months is recommended. This recommendation follows ACR consensus guidelines: Managing Incidental Findings on Abdominal CT: White Paper of the ACR Incidental Findings Committee. J Am Coll Radiol 2010;7:754-773  Bilateral renal cysts.   Electronically Signed   By: Misty Stanley M.D.   On: 02/02/2015 14:21   Dg Ercp Biliary & Pancreatic Ducts  02/03/2015   CLINICAL DATA:  Gallstones, CBD exploration, sphincterotomy  EXAM: ERCP  TECHNIQUE: Multiple spot images obtained with the fluoroscopic device and submitted for interpretation post-procedure.  FLUOROSCOPY TIME:  If the device does not provide the exposure index:  Fluoroscopy Time:  31 seconds  Number of Acquired Images:  4  COMPARISON:  None.  FINDINGS: Four intraprocedural spot images are provided. There is cannulation of the common bile duct. There is opacification of the common bile duct . There is mild irregularity of the distal common bile duct which does not persist on subsequent images likely reflecting mild spasm. There is a filling defect on the first image within the distal common bile duct which may represent air versus gallstone.  IMPRESSION: Status post ERCP as detailed above.  These images were submitted for radiologic interpretation only. Please see the procedural report for the amount of contrast and the fluoroscopy time utilized.   Electronically Signed   By: Kathreen Devoid   On: 02/03/2015 10:25   Dg Abd  Acute W/chest  02/02/2015   CLINICAL DATA:  Abdominal pain and nausea  EXAM: DG ABDOMEN ACUTE W/ 1V CHEST  COMPARISON:  Chest radiograph January 19, 2012; CT abdomen and pelvis January 20, 2012  FINDINGS: PA chest: No edema or consolidation. Heart size and pulmonary vascularity are normal. No adenopathy. There is atherosclerotic change in the aorta.  Supine and upright abdomen: There is moderate stool throughout the colon. There is no bowel dilatation or air-fluid level suggesting obstruction. No free air. There are surgical clips in the right upper quadrant.  IMPRESSION: Bowel gas pattern unremarkable.  No edema or consolidation.   Electronically Signed   By: Lowella Grip III M.D.   On: 02/02/2015 00:18    Scheduled Meds: . insulin aspart  0-9 Units Subcutaneous TID WC  . pantoprazole  40 mg Oral Daily  . piperacillin-tazobactam (ZOSYN)  IV  3.375 g Intravenous Q8H   Continuous Infusions: . sodium chloride 75 mL/hr at 02/02/15 1736   Antibiotics Given (last 72 hours)    Date/Time Action Medication Dose Rate   02/02/15 1007 Given   piperacillin-tazobactam (ZOSYN) IVPB 3.375 g 3.375 g 12.5 mL/hr   02/02/15 1734 Given   piperacillin-tazobactam (ZOSYN) IVPB 3.375 g 3.375 g 12.5 mL/hr   02/03/15 0132 Given   piperacillin-tazobactam (ZOSYN) IVPB 3.375 g 3.375 g 12.5 mL/hr   02/03/15 1145 Given   piperacillin-tazobactam (ZOSYN) IVPB 3.375 g 3.375 g 12.5 mL/hr      Principal Problem:   Abdominal pain Active Problems:   Elevated liver enzymes   Hypertension   Diabetes mellitus without complication   HLD (hyperlipidemia)   GERD (gastroesophageal reflux disease)   Acute renal failure superimposed on stage 3 chronic kidney disease    Time spent:71min    [facility]  [facility] Pager [phone]. If 7PM-7AM, please contact night-coverage at [email or web address], password [facility] 02/03/2015, 2:29 PM  LOS: 1 day

## 2015-02-04 ENCOUNTER — Inpatient Hospital Stay (HOSPITAL_COMMUNITY): Payer: Medicare Other

## 2015-02-04 LAB — CBC
HEMATOCRIT: 20.7 % — AB (ref 39.0–52.0)
Hemoglobin: 7.3 g/dL — ABNORMAL LOW (ref 13.0–17.0)
MCH: 30.7 pg (ref 26.0–34.0)
MCHC: 35.3 g/dL (ref 30.0–36.0)
MCV: 87 fL (ref 78.0–100.0)
Platelets: 140 10*3/uL — ABNORMAL LOW (ref 150–400)
RBC: 2.38 MIL/uL — AB (ref 4.22–5.81)
RDW: 13 % (ref 11.5–15.5)
WBC: 9 10*3/uL (ref 4.0–10.5)

## 2015-02-04 LAB — COMPREHENSIVE METABOLIC PANEL
ALK PHOS: 122 U/L (ref 38–126)
ALT: 347 U/L — ABNORMAL HIGH (ref 17–63)
ANION GAP: 9 (ref 5–15)
AST: 122 U/L — AB (ref 15–41)
Albumin: 2.7 g/dL — ABNORMAL LOW (ref 3.5–5.0)
BUN: 15 mg/dL (ref 6–20)
CHLORIDE: 109 mmol/L (ref 101–111)
CO2: 20 mmol/L — AB (ref 22–32)
CREATININE: 1.77 mg/dL — AB (ref 0.61–1.24)
Calcium: 7.2 mg/dL — ABNORMAL LOW (ref 8.9–10.3)
GFR calc Af Amer: 41 mL/min — ABNORMAL LOW (ref >60)
GFR, EST NON AFRICAN AMERICAN: 36 mL/min — AB (ref >60)
GLUCOSE: 131 mg/dL — AB (ref 65–99)
POTASSIUM: 3.9 mmol/L (ref 3.5–5.1)
SODIUM: 138 mmol/L (ref 135–145)
Total Bilirubin: 4.4 mg/dL — ABNORMAL HIGH (ref 0.3–1.2)
Total Protein: 5.8 g/dL — ABNORMAL LOW (ref 6.5–8.1)

## 2015-02-04 LAB — OCCULT BLOOD X 1 CARD TO LAB, STOOL: Fecal Occult Bld: POSITIVE — AB

## 2015-02-04 LAB — GLUCOSE, CAPILLARY
GLUCOSE-CAPILLARY: 124 mg/dL — AB (ref 65–99)
GLUCOSE-CAPILLARY: 171 mg/dL — AB (ref 65–99)
GLUCOSE-CAPILLARY: 159 mg/dL — AB (ref 65–99)
Glucose-Capillary: 135 mg/dL — ABNORMAL HIGH (ref 65–99)

## 2015-02-04 LAB — PREPARE RBC (CROSSMATCH)

## 2015-02-04 MED ORDER — FUROSEMIDE 10 MG/ML IJ SOLN
20.0000 mg | Freq: Once | INTRAMUSCULAR | Status: AC
  Administered 2015-02-04: 20 mg via INTRAVENOUS
  Filled 2015-02-04: qty 2

## 2015-02-04 MED ORDER — FUROSEMIDE 10 MG/ML IJ SOLN
20.0000 mg | Freq: Two times a day (BID) | INTRAMUSCULAR | Status: DC
  Administered 2015-02-04 – 2015-02-05 (×2): 20 mg via INTRAVENOUS
  Filled 2015-02-04 (×2): qty 2

## 2015-02-04 MED ORDER — PANTOPRAZOLE SODIUM 40 MG IV SOLR
40.0000 mg | Freq: Two times a day (BID) | INTRAVENOUS | Status: DC
  Administered 2015-02-04 – 2015-02-05 (×2): 40 mg via INTRAVENOUS
  Filled 2015-02-04 (×2): qty 40

## 2015-02-04 MED ORDER — SODIUM CHLORIDE 0.9 % IV SOLN
Freq: Once | INTRAVENOUS | Status: AC
  Administered 2015-02-04: 12:00:00 via INTRAVENOUS

## 2015-02-04 NOTE — Progress Notes (Signed)
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  - creatinine stable, continue IVF today, Bmet in am - hold ACEI and Diuretics  Hypertension: -hold Triamterene and lisinopril given AKI -stable  Diet-controlled diabetes:  -hbiac 7.2, SSI  HLD:  -Continue home medications: Lipitor  GERD: -Protonix  DVT ppx: SCD  Code Status: Full Code Family Communication: wife Disposition Plan: home when stable   Consultants:  GI  Procedures:  ERCP: ERCP with stone extraction  HPI/Subjective: Feels weak and dyspneic, dyspneic with activity  Objective: Filed Vitals:   02/04/15 0616  BP: 107/54  Pulse: 70  Temp: 99 F (37.2 C)  Resp:     Intake/Output Summary (Last 24 hours) at 02/04/15 1139 Last data filed at 02/04/15 0913  Gross per 24 hour  Intake    560 ml  Output    500 ml  Net     60 ml   Filed Weights   02/02/15 0238 02/02/15 0258 02/03/15 0821  Weight: 82.9 kg (182 lb 12.2 oz) 82.4 kg (181 lb 10.5 oz) 82.101 kg (181 lb)    Exam:   General:  AAOx3  Cardiovascular: S1S2/RRR  Respiratory: CTAB  Abdomen: soft, NT, BS present  Musculoskeletal: no dema c/c  Data Reviewed: Basic Metabolic Panel:  Recent Labs Lab 02/01/15 2148 02/02/15 0444 02/03/15 0746 02/04/15 0646  NA 137 137 137 138  K 3.8 3.6 3.5 3.9  CL 99* 103 107 109  CO2 26 24 23  20*  GLUCOSE 228* 239* 147* 131*  BUN 23* 23* 17 15  CREATININE 1.53* 1.86* 1.78* 1.77*  CALCIUM 9.2 8.2* 7.5* 7.2*   Liver  Function Tests:  Recent Labs Lab 02/01/15 2148 02/02/15 0444 02/03/15 0746 02/04/15 0646  AST 563* 1337* 310* 122*  ALT 333* 1093* 577* 347*  ALKPHOS 191* 166* 118 122  BILITOT 2.0* 3.1* 5.6* 4.4*  PROT 7.1 6.0* 5.8* 5.8*  ALBUMIN 4.1 3.4* 3.0* 2.7*    Recent Labs Lab 02/01/15 2148 02/02/15 0444  LIPASE 74* 44   No results for input(s): AMMONIA in the last 168 hours. CBC:  Recent Labs Lab 02/01/15 2148 02/02/15 0444 02/03/15 0746 02/04/15 0646  WBC 10.1 7.1 10.0 9.0  HGB 13.3 11.9* 10.9* 7.3*  HCT 38.1* 35.0* 31.7* 20.7*  MCV 89.2 89.1 89.8 87.0  PLT 183 145* 119* 140*   Cardiac Enzymes: No results for input(s): CKTOTAL, CKMB, CKMBINDEX, TROPONINI in the last 168 hours. BNP (last 3 results) No results for input(s): BNP in the last 8760 hours.  ProBNP (last 3 results) No results for input(s): PROBNP in the last 8760 hours.  CBG:  Recent Labs Lab 02/03/15 0740 02/03/15 1140 02/03/15 1643 02/03/15 2149 02/04/15 0801  GLUCAP 145* 136* 164* 141* 124*    Recent Results (from the past 240 hour(s))  Culture, blood (x 2)     Status: None (Preliminary result)   Collection Time: 02/02/15  4:44 AM  Result Value Ref Range Status   Specimen Description BLOOD LEFT ANTECUBITAL  Final   Special Requests BOTTLES DRAWN AEROBIC AND ANAEROBIC 10CC  Final  Culture NO GROWTH 1 DAY  Final   Report Status PENDING  Incomplete  Culture, blood (x 2)     Status: None (Preliminary result)   Collection Time: 02/02/15  5:02 AM  Result Value Ref Range Status   Specimen Description BLOOD BLOOD RIGHT HAND  Final   Special Requests BOTTLES DRAWN AEROBIC ONLY 3CC  Final   Culture NO GROWTH 1 DAY  Final   Report Status PENDING  Incomplete     Studies: Mr Abdomen Mrcp Wo Cm  02/02/2015   CLINICAL DATA:  Subsequent encounter for 8 mm cystic lesion in the head of the pancreas, new on recent CT scan.  EXAM: MRI ABDOMEN WITHOUT CONTRAST  (INCLUDING MRCP)  TECHNIQUE: Multiplanar  multisequence MR imaging of the abdomen was performed. Heavily T2-weighted images of the biliary and pancreatic ducts were obtained, and three-dimensional MRCP images were rendered by post processing.  COMPARISON:  CT from 02/01/2015.  CT from 01/20/2012.  FINDINGS: Lower chest: Probable dependent atelectasis in the lungs bilaterally.  Hepatobiliary: No focal intraparenchymal abnormality on this study performed without intravenous contrast material. Pneumobilia is evident. The extrahepatic common duct measures up to 11 mm in diameter.  Gallbladder is surgically absent. The extrahepatic common duct measures up to about 11 mm in diameter. Fluid fluid or fluid debris level in the duct is evident. There is a low signal intensity apparent filling defect in the distal common bile duct, at the level of the pancreatic head, just proximal to the ampulla. This measures approximately 7 x 8 mm on image 30 series 7).  Pancreas: 8 mm homogeneous T2 hyperintense lesion is identified in the head of the pancreas. No direct communication to the main pancreatic duct is evident. No associated dilatation of the main pancreatic duct.  Spleen: No splenomegaly. No focal mass lesion.  Adrenals/Urinary Tract: No adrenal nodule or mass. 3.3 cm well-defined homogeneous T1 hypo intense, T2 hyperintense lesion in the interpolar right kidney is compatible with a simple cyst. Other tiny cystic lesions are seen in the right renal parenchyma. 15 mm simple cyst is identified in the interpolar left kidney with other tiny cystic left renal lesions associated. Neither kidney has been fully evaluated on this study.  Stomach/Bowel: Stomach is nondistended. No gastric wall thickening. No evidence of outlet obstruction. Visualize small bowel and colon of the upper abdomen is unremarkable.  Vascular/Lymphatic: There is no gastrohepatic or hepatoduodenal ligament lymphadenopathy. No intraperitoneal or retroperitoneal lymphadenopy.  Other: No abdominal aortic  aneurysm. No abdominal atherosclerotic calcification.  Musculoskeletal: No abnormal marrow signal within the visualized bony anatomy.  IMPRESSION: Mild extrahepatic biliary duct dilatation in this patient status post cholecystectomy. The presence of pneumobilia suggest previous sphincterotomy. On today's exam, there is a 7 x 8 mm filling defect in the distal common bile duct, just proximal to the ampulla. Imaging features are suspicious for a distal common bile duct stone.  8 mm simple cyst identified in the pancreatic head. No communication to the main pancreatic duct is evident. Followup MRI without and with contrast in 12 months is recommended. This recommendation follows ACR consensus guidelines: Managing Incidental Findings on Abdominal CT: White Paper of the ACR Incidental Findings Committee. J Am Coll Radiol 2010;7:754-773  Bilateral renal cysts.   Electronically Signed   By: Misty Stanley M.D.   On: 02/02/2015 14:21   Dg Ercp Biliary & Pancreatic Ducts  02/03/2015   CLINICAL DATA:  Gallstones, CBD exploration, sphincterotomy  EXAM: ERCP  TECHNIQUE: Multiple spot images obtained with  the fluoroscopic device and submitted for interpretation post-procedure.  FLUOROSCOPY TIME:  If the device does not provide the exposure index:  Fluoroscopy Time:  31 seconds  Number of Acquired Images:  4  COMPARISON:  None.  FINDINGS: Four intraprocedural spot images are provided. There is cannulation of the common bile duct. There is opacification of the common bile duct . There is mild irregularity of the distal common bile duct which does not persist on subsequent images likely reflecting mild spasm. There is a filling defect on the first image within the distal common bile duct which may represent air versus gallstone.  IMPRESSION: Status post ERCP as detailed above.  These images were submitted for radiologic interpretation only. Please see the procedural report for the amount of contrast and the fluoroscopy time  utilized.   Electronically Signed   By: Kathreen Devoid   On: 02/03/2015 10:25    Scheduled Meds: . furosemide  20 mg Intravenous Once  . insulin aspart  0-9 Units Subcutaneous TID WC  . pantoprazole  40 mg Oral Daily  . piperacillin-tazobactam (ZOSYN)  IV  3.375 g Intravenous Q8H   Continuous Infusions:   Antibiotics Given (last 72 hours)    Date/Time Action Medication Dose Rate   02/02/15 1007 Given   piperacillin-tazobactam (ZOSYN) IVPB 3.375 g 3.375 g 12.5 mL/hr   02/02/15 1734 Given   piperacillin-tazobactam (ZOSYN) IVPB 3.375 g 3.375 g 12.5 mL/hr   02/03/15 0132 Given   piperacillin-tazobactam (ZOSYN) IVPB 3.375 g 3.375 g 12.5 mL/hr   02/03/15 1145 Given   piperacillin-tazobactam (ZOSYN) IVPB 3.375 g 3.375 g 12.5 mL/hr   02/03/15 1754 Given   piperacillin-tazobactam (ZOSYN) IVPB 3.375 g 3.375 g 12.5 mL/hr   02/04/15 0200 Given   piperacillin-tazobactam (ZOSYN) IVPB 3.375 g 3.375 g 12.5 mL/hr   02/04/15 0946 Given   piperacillin-tazobactam (ZOSYN) IVPB 3.375 g 3.375 g 12.5 mL/hr      Principal Problem:   Abdominal pain Active Problems:   Elevated liver enzymes   Hypertension   Diabetes mellitus without complication   HLD (hyperlipidemia)   GERD (gastroesophageal reflux disease)   Acute renal failure superimposed on stage 3 chronic kidney disease    Time spent:26min    [facility]  [facility] Pager [phone]. If 7PM-7AM, please contact night-coverage at [email or web address], password [facility] 02/04/2015, 11:39 AM  LOS: 2 days

## 2015-02-04 NOTE — Progress Notes (Signed)
Fine crackles auscultated in lower lobes. Pt reports feeling out of breath and fatigued from ambulating to the bathroom.

## 2015-02-04 NOTE — Progress Notes (Signed)
Subjective: No acute events.  Complains of a sore lower abdomen and sore arms.  Objective: Vital signs in last 24 hours: Temp:  [98.1 F (36.7 C)-99 F (37.2 C)] 99 F (37.2 C) (08/14 0616) Pulse Rate:  [59-86] 70 (08/14 0616) Resp:  [18-26] 20 (08/13 1259) BP: (105-146)/(54-85) 107/54 mmHg (08/14 0616) SpO2:  [89 %-100 %] 94 % (08/14 0616) Weight:  [82.101 kg (181 lb)] 82.101 kg (181 lb) (08/13 0821) Last BM Date: 02/02/15  Intake/Output from previous day: 08/13 0701 - 08/14 0700 In: 320 [P.O.:320] Out: 300 [Urine:300] Intake/Output this shift:    General appearance: alert and no distress GI: mild lower periumbilial pain  Lab Results:  Recent Labs  02/01/15 2148 02/02/15 0444 02/03/15 0746  WBC 10.1 7.1 10.0  HGB 13.3 11.9* 10.9*  HCT 38.1* 35.0* 31.7*  PLT 183 145* 119*   BMET  Recent Labs  02/01/15 2148 02/02/15 0444 02/03/15 0746  NA 137 137 137  K 3.8 3.6 3.5  CL 99* 103 107  CO2 26 24 23   GLUCOSE 228* 239* 147*  BUN 23* 23* 17  CREATININE 1.53* 1.86* 1.78*  CALCIUM 9.2 8.2* 7.5*   LFT  Recent Labs  02/02/15 0444 02/03/15 0746  PROT 6.0* 5.8*  ALBUMIN 3.4* 3.0*  AST 1337* 310*  ALT 1093* 577*  ALKPHOS 166* 118  BILITOT 3.1* 5.6*  BILIDIR 1.1*  --    PT/INR  Recent Labs  02/02/15 0444  LABPROT 15.4*  INR 1.21   Hepatitis Panel  Recent Labs  02/02/15 0444  HEPBSAG Negative  HCVAB <0.1  HEPAIGM Negative  HEPBIGM Positive*   C-Diff No results for input(s): CDIFFTOX in the last 72 hours. Fecal Lactopherrin No results for input(s): FECLLACTOFRN in the last 72 hours.  Studies/Results: Mr Abdomen Mrcp Wo Cm  02/02/2015   CLINICAL DATA:  Subsequent encounter for 8 mm cystic lesion in the head of the pancreas, new on recent CT scan.  EXAM: MRI ABDOMEN WITHOUT CONTRAST  (INCLUDING MRCP)  TECHNIQUE: Multiplanar multisequence MR imaging of the abdomen was performed. Heavily T2-weighted images of the biliary and pancreatic ducts were  obtained, and three-dimensional MRCP images were rendered by post processing.  COMPARISON:  CT from 02/01/2015.  CT from 01/20/2012.  FINDINGS: Lower chest: Probable dependent atelectasis in the lungs bilaterally.  Hepatobiliary: No focal intraparenchymal abnormality on this study performed without intravenous contrast material. Pneumobilia is evident. The extrahepatic common duct measures up to 11 mm in diameter.  Gallbladder is surgically absent. The extrahepatic common duct measures up to about 11 mm in diameter. Fluid fluid or fluid debris level in the duct is evident. There is a low signal intensity apparent filling defect in the distal common bile duct, at the level of the pancreatic head, just proximal to the ampulla. This measures approximately 7 x 8 mm on image 30 series 7).  Pancreas: 8 mm homogeneous T2 hyperintense lesion is identified in the head of the pancreas. No direct communication to the main pancreatic duct is evident. No associated dilatation of the main pancreatic duct.  Spleen: No splenomegaly. No focal mass lesion.  Adrenals/Urinary Tract: No adrenal nodule or mass. 3.3 cm well-defined homogeneous T1 hypo intense, T2 hyperintense lesion in the interpolar right kidney is compatible with a simple cyst. Other tiny cystic lesions are seen in the right renal parenchyma. 15 mm simple cyst is identified in the interpolar left kidney with other tiny cystic left renal lesions associated. Neither kidney has been fully evaluated on  this study.  Stomach/Bowel: Stomach is nondistended. No gastric wall thickening. No evidence of outlet obstruction. Visualize small bowel and colon of the upper abdomen is unremarkable.  Vascular/Lymphatic: There is no gastrohepatic or hepatoduodenal ligament lymphadenopathy. No intraperitoneal or retroperitoneal lymphadenopy.  Other: No abdominal aortic aneurysm. No abdominal atherosclerotic calcification.  Musculoskeletal: No abnormal marrow signal within the visualized  bony anatomy.  IMPRESSION: Mild extrahepatic biliary duct dilatation in this patient status post cholecystectomy. The presence of pneumobilia suggest previous sphincterotomy. On today's exam, there is a 7 x 8 mm filling defect in the distal common bile duct, just proximal to the ampulla. Imaging features are suspicious for a distal common bile duct stone.  8 mm simple cyst identified in the pancreatic head. No communication to the main pancreatic duct is evident. Followup MRI without and with contrast in 12 months is recommended. This recommendation follows ACR consensus guidelines: Managing Incidental Findings on Abdominal CT: White Paper of the ACR Incidental Findings Committee. J Am Coll Radiol 2010;7:754-773  Bilateral renal cysts.   Electronically Signed   By: Misty Stanley M.D.   On: 02/02/2015 14:21   Dg Ercp Biliary & Pancreatic Ducts  02/03/2015   CLINICAL DATA:  Gallstones, CBD exploration, sphincterotomy  EXAM: ERCP  TECHNIQUE: Multiple spot images obtained with the fluoroscopic device and submitted for interpretation post-procedure.  FLUOROSCOPY TIME:  If the device does not provide the exposure index:  Fluoroscopy Time:  31 seconds  Number of Acquired Images:  4  COMPARISON:  None.  FINDINGS: Four intraprocedural spot images are provided. There is cannulation of the common bile duct. There is opacification of the common bile duct . There is mild irregularity of the distal common bile duct which does not persist on subsequent images likely reflecting mild spasm. There is a filling defect on the first image within the distal common bile duct which may represent air versus gallstone.  IMPRESSION: Status post ERCP as detailed above.  These images were submitted for radiologic interpretation only. Please see the procedural report for the amount of contrast and the fluoroscopy time utilized.   Electronically Signed   By: Kathreen Devoid   On: 02/03/2015 10:25    Medications:  Scheduled: . insulin aspart   0-9 Units Subcutaneous TID WC  . pantoprazole  40 mg Oral Daily  . piperacillin-tazobactam (ZOSYN)  IV  3.375 g Intravenous Q8H   Continuous: . sodium chloride 75 mL/hr at 02/03/15 2236    Assessment/Plan: 1) S/p choledocholithiasis. 2) S/p cholangitis. 3) Renal insufficiency. 4) Lower muscular abdominal pain.   The patient's abdomen is rather benign.  With further discussion he states that it feels more muscular as he has to strain to pull himself up out of the bed.  His arms are also sore.  As for the ERCP, the examination itself was less than 10 minutes.  I doubt there is any complication from the procedure.  His liver panel is pending this AM.  Plan: 1)  Await TB.  If blood work reveals an improvement he can be safely discharged home. 2) I think a total of 7 days of antibiotic treatment will be sufficient now that his obstruction is relieved.  He can be discharged home on Cipro.  LOS: 2 days   Gregori Abril D 02/04/2015, 7:46 AM

## 2015-02-05 ENCOUNTER — Encounter (HOSPITAL_COMMUNITY): Payer: Self-pay | Admitting: Gastroenterology

## 2015-02-05 DIAGNOSIS — K8050 Calculus of bile duct without cholangitis or cholecystitis without obstruction: Secondary | ICD-10-CM | POA: Insufficient documentation

## 2015-02-05 LAB — COMPREHENSIVE METABOLIC PANEL
ALK PHOS: 132 U/L — AB (ref 38–126)
ALT: 249 U/L — ABNORMAL HIGH (ref 17–63)
ANION GAP: 11 (ref 5–15)
AST: 58 U/L — ABNORMAL HIGH (ref 15–41)
Albumin: 2.7 g/dL — ABNORMAL LOW (ref 3.5–5.0)
BILIRUBIN TOTAL: 6.8 mg/dL — AB (ref 0.3–1.2)
BUN: 10 mg/dL (ref 6–20)
CALCIUM: 7.5 mg/dL — AB (ref 8.9–10.3)
CO2: 23 mmol/L (ref 22–32)
Chloride: 106 mmol/L (ref 101–111)
Creatinine, Ser: 1.62 mg/dL — ABNORMAL HIGH (ref 0.61–1.24)
GFR calc Af Amer: 46 mL/min — ABNORMAL LOW (ref >60)
GFR, EST NON AFRICAN AMERICAN: 40 mL/min — AB (ref >60)
Glucose, Bld: 137 mg/dL — ABNORMAL HIGH (ref 65–99)
POTASSIUM: 2.9 mmol/L — AB (ref 3.5–5.1)
Sodium: 140 mmol/L (ref 135–145)
TOTAL PROTEIN: 5.8 g/dL — AB (ref 6.5–8.1)

## 2015-02-05 LAB — GLUCOSE, CAPILLARY
GLUCOSE-CAPILLARY: 115 mg/dL — AB (ref 65–99)
GLUCOSE-CAPILLARY: 126 mg/dL — AB (ref 65–99)
GLUCOSE-CAPILLARY: 180 mg/dL — AB (ref 65–99)
Glucose-Capillary: 241 mg/dL — ABNORMAL HIGH (ref 65–99)
Glucose-Capillary: 101 mg/dL — ABNORMAL HIGH (ref 65–99)

## 2015-02-05 LAB — TYPE AND SCREEN
ABO/RH(D): O POS
ANTIBODY SCREEN: NEGATIVE
Unit division: 0

## 2015-02-05 LAB — CBC
HEMATOCRIT: 33.1 % — AB (ref 39.0–52.0)
Hemoglobin: 11.6 g/dL — ABNORMAL LOW (ref 13.0–17.0)
MCH: 31 pg (ref 26.0–34.0)
MCHC: 35 g/dL (ref 30.0–36.0)
MCV: 88.5 fL (ref 78.0–100.0)
Platelets: 118 10*3/uL — ABNORMAL LOW (ref 150–400)
RBC: 3.74 MIL/uL — ABNORMAL LOW (ref 4.22–5.81)
RDW: 13.1 % (ref 11.5–15.5)
WBC: 7.4 10*3/uL (ref 4.0–10.5)

## 2015-02-05 MED ORDER — POTASSIUM CHLORIDE CRYS ER 20 MEQ PO TBCR
40.0000 meq | EXTENDED_RELEASE_TABLET | ORAL | Status: AC
  Administered 2015-02-05 (×2): 40 meq via ORAL
  Filled 2015-02-05 (×2): qty 2

## 2015-02-05 MED ORDER — PANTOPRAZOLE SODIUM 40 MG PO TBEC
40.0000 mg | DELAYED_RELEASE_TABLET | Freq: Every day | ORAL | Status: DC
  Administered 2015-02-06: 40 mg via ORAL
  Filled 2015-02-05: qty 1

## 2015-02-05 MED ORDER — CIPROFLOXACIN HCL 500 MG PO TABS
500.0000 mg | ORAL_TABLET | Freq: Two times a day (BID) | ORAL | Status: DC
  Administered 2015-02-05 – 2015-02-06 (×3): 500 mg via ORAL
  Filled 2015-02-05 (×3): qty 1

## 2015-02-05 MED ORDER — METRONIDAZOLE 500 MG PO TABS
500.0000 mg | ORAL_TABLET | Freq: Three times a day (TID) | ORAL | Status: DC
  Administered 2015-02-05 – 2015-02-06 (×4): 500 mg via ORAL
  Filled 2015-02-05 (×4): qty 1

## 2015-02-05 NOTE — Progress Notes (Signed)
Daily Rounding Note  02/05/2015, 9:47 AM  LOS: 3 days   SUBJECTIVE:       No episodes abd pain.  Malaise, weakness resolved.  SOB improved.  Stools yesterday, one of which was dark but not grossly bloody. Hungry.  Tolerated soft diet yesterday.   OBJECTIVE:         Vital signs in last 24 hours:    Temp:  [98.2 F (36.8 C)-98.4 F (36.9 C)] 98.4 F (36.9 C) (08/15 0523) Pulse Rate:  [58-76] 58 (08/15 0523) Resp:  [18-20] 20 (08/14 1727) BP: (118-156)/(53-88) 118/53 mmHg (08/15 0523) SpO2:  [95 %-98 %] 95 % (08/15 0523) Last BM Date: 02/02/15 Filed Weights   02/02/15 0238 02/02/15 0258 02/03/15 0821  Weight: 182 lb 12.2 oz (82.9 kg) 181 lb 10.5 oz (82.4 kg) 181 lb (82.101 kg)   General: pleasant, looks well   Heart: RRR Chest: clear bil.  No dyspnea or cough. Abdomen: soft, NT, ND.  No mass or HSM.  Active BS  Extremities: no CCE Neuro/Psych:  Pleasant, alert.  Oriented x 3.  Calm.   Intake/Output from previous day: 08/14 0701 - 08/15 0700 In: 1065 [P.O.:680; Blood:335; IV Piggyback:50] Out: 5366 [Urine:1725]  Intake/Output this shift: Total I/O In: 0  Out: 1050 [Urine:1050]  Lab Results:  Recent Labs  02/03/15 0746 02/04/15 0646 02/05/15 0604  WBC 10.0 9.0 7.4  HGB 10.9* 7.3* 11.6*  HCT 31.7* 20.7* 33.1*  PLT 119* 140* 118*   BMET  Recent Labs  02/03/15 0746 02/04/15 0646 02/05/15 0604  NA 137 138 140  K 3.5 3.9 2.9*  CL 107 109 106  CO2 23 20* 23  GLUCOSE 147* 131* 137*  BUN 17 15 10   CREATININE 1.78* 1.77* 1.62*  CALCIUM 7.5* 7.2* 7.5*   LFT  Recent Labs  02/03/15 0746 02/04/15 0646 02/05/15 0604  PROT 5.8* 5.8* 5.8*  ALBUMIN 3.0* 2.7* 2.7*  AST 310* 122* 58*  ALT 577* 347* 249*  ALKPHOS 118 122 132*  BILITOT 5.6* 4.4* 6.8*   PT/INR No results for input(s): LABPROT, INR in the last 72 hours. Hepatitis Panel No results for input(s): HEPBSAG, HCVAB, HEPAIGM, HEPBIGM in the  last 72 hours.  Studies/Results: Ct Abdomen Pelvis Wo Contrast  02/04/2015   CLINICAL DATA:  79 year old male with anemia. Evaluate for intra-abdominal bleeding. Abdominal pain since ERCP.  EXAM: CT ABDOMEN AND PELVIS WITHOUT CONTRAST  TECHNIQUE: Multidetector CT imaging of the abdomen and pelvis was performed following the standard protocol without IV contrast.  COMPARISON:  CT of the abdomen and pelvis 02/01/2015.  FINDINGS: Lower chest: Small left and moderate right pleural effusions with associated passive atelectasis in the dependent portions of the lungs bilaterally. Thickening of the peribronchovascular interstitium in the right lower lobe, and to a lesser extent in the medial segment of the right middle lobe, which could reflect sequela of prior recent aspiration. Heart size is borderline enlarged. Extensive calcifications of the aortic valve. Atherosclerotic calcifications in the left anterior descending and left circumflex coronary arteries.  Hepatobiliary: Pneumobilia again noted, presumably from prior sphincterotomy. Status post cholecystectomy. No discrete cystic or solid hepatic lesion confidently identified on today's noncontrast CT examination.  Pancreas: No pancreatic mass or peripancreatic inflammatory changes on today's noncontrast CT examination.  Spleen: Trace amount of perisplenic ascites. Otherwise, unremarkable.  Adrenals/Urinary Tract: Bilateral adrenal glands are normal in appearance. Several low-attenuation lesions in the kidneys bilaterally, incompletely characterized on today's noncontrast CT  examination, but similar to prior studies, favored to represent cysts, largest which measures 3.4 cm in the interpolar region of the right kidney. No hydroureteronephrosis. Unenhanced appearance of the urinary bladder is normal.  Stomach/Bowel: Unenhanced appearance of the stomach is normal. No pathologic dilatation of small bowel or colon. Numerous colonic diverticulae are noted, most evident  in the sigmoid colon, without surrounding inflammatory changes to suggest an acute diverticulitis at this time. Normal appendix.  Vascular/Lymphatic: Atherosclerosis throughout the abdominal and pelvic vasculature, without evidence of aneurysm. No lymphadenopathy noted in the abdomen or pelvis on today's noncontrast CT examination.  Reproductive: Prostate gland and seminal vesicles are unremarkable in appearance.  Other: Small volume of low-attenuation ascites noted, predominantly in the low anatomic pelvis. No high attenuation fluid collection in the retroperitoneum or peritoneal cavity to suggest significant postprocedural hemorrhage. No pneumoperitoneum.  Musculoskeletal: 1.3 cm sclerotic lesion with narrow zone of transition in the left ischium is unchanged compared to prior examinations, presumably a tiny bone island. There are no aggressive appearing lytic or blastic lesions noted in the visualized portions of the skeleton.  IMPRESSION: 1. No findings to suggest significant hemorrhage in the abdomen or pelvis. 2. Small volume of ascites. 3. Colonic diverticulosis without evidence of acute diverticulitis at this time. 4. Moderate right and small left pleural effusions lying dependently, with some associated passive atelectasis in the lower lobes of the lungs bilaterally. There are additional findings in the right lower lobe that could indicate sequela of recent aspiration. 5. Atherosclerosis, including at least 2 vessel coronary artery disease. Assessment for potential risk factor modification, dietary therapy or pharmacologic therapy may be warranted, if clinically indicated. 6. There are calcifications of the aortic valve. Echocardiographic correlation for evaluation of potential valvular dysfunction may be warranted if clinically indicated. 7. Normal appendix. 8. Additional incidental findings, as above.   Electronically Signed   By: Vinnie Langton M.D.   On: 02/04/2015 14:25   Dg Ercp Biliary &  Pancreatic Ducts  02/03/2015   CLINICAL DATA:  Gallstones, CBD exploration, sphincterotomy  EXAM: ERCP  TECHNIQUE: Multiple spot images obtained with the fluoroscopic device and submitted for interpretation post-procedure.  FLUOROSCOPY TIME:  If the device does not provide the exposure index:  Fluoroscopy Time:  31 seconds  Number of Acquired Images:  4  COMPARISON:  None.  FINDINGS: Four intraprocedural spot images are provided. There is cannulation of the common bile duct. There is opacification of the common bile duct . There is mild irregularity of the distal common bile duct which does not persist on subsequent images likely reflecting mild spasm. There is a filling defect on the first image within the distal common bile duct which may represent air versus gallstone.  IMPRESSION: Status post ERCP as detailed above.  These images were submitted for radiologic interpretation only. Please see the procedural report for the amount of contrast and the fluoroscopy time utilized.   Electronically Signed   By: Kathreen Devoid   On: 02/03/2015 10:25   Scheduled Meds: . insulin aspart  0-9 Units Subcutaneous TID WC  . pantoprazole (PROTONIX) IV  40 mg Intravenous Q12H  . piperacillin-tazobactam (ZOSYN)  IV  3.375 g Intravenous Q8H   Continuous Infusions:  PRN Meds:.acetaminophen, fentaNYL (SUBLIMAZE) injection, morphine injection, ondansetron (ZOFRAN) IV, ondansetron (ZOFRAN) IV, oxyCODONE **OR** oxyCODONE   ASSESMENT:   *  Choledocholithiasis.  Previous cholecystectomy and ERCP 2006.   CT Abd/Pelvis 02/01/15: Chronic pneumobilia increased c/w 2013. 8 mm cystic lesion in proximal body of  pancreas with associated calcification. Diverticulosis. Renal cysts. Calcifacation of aorta and branches. Prostamegaly.  Abd [place] 02/02/15: s/p chole. 4.8 mm CBD. Unremarkable,normal, but incompletely visd, pancreas,  02/03/15 ERCP with  Sphincterotomy and CBD stone removal.   MRCP 02/02/15: persistent pneumobilia, filling  defect in distal CBD. Simple, 8 mm cyst in head of pancreas, no communication with PD, suggest 12 month follow up MRI, with and without contrast.  ERCP 02/03/15: ampulla within large diverticulum, 1 cm stone and CBD dilated to 1 cm.  S/p limited sphincterotomy and stone extraction.  LFTs steadily normalizing except for the rising T bili, which can happen in setting of renal insufficiency. .   *  Cholangitis.  Continues on Zosyn, day 4. Normal WBCs since arrival.  No recurrent fevers or pain. No growth on blood  Clx's.   *  Acute, normocytic anemia. 8/14 FOBT +.  S/p PRBC x 1 on 8/14 with response well beyond norm (up 4 grams).   CT abdomen/pelvis 8/14: small ascites, no hematoma or  Source for bleeding.   bil pleural effusions and atx, ? aspiration. >= 2 vsl CAD. Calcific aortic valve.  ? Limited post sphincterotomy bleed.    *  Thrombocytopenia. Acute.   *  Hypokalemia.   *  Type 2 DM.     PLAN   *  Defer potassium supplementation to Hospitalist.  Note that Furosemide (only one dose after PRBC transfusion)  was discontinued this AM.    *  CBC and CMET in AM.    *  Switched him to carb mod diet.   *  7 days of abx, could switch to po metronidazole and cipro.    *  No need for GI follow up (Dr Ardis Hughs) unless having issues.  Will get colonoscopy calll back letter for 09/2015.     Azucena Freed  02/05/2015, 9:47 AM Pager: [phone]

## 2015-02-05 NOTE — Care Management Important Message (Signed)
Important Message  Patient Details  Name: Jesse Hoffman MRN: [id number] Date of Birth: 12/02/1935   Medicare Important Message Given:  Yes-second notification given    Nathen May 02/05/2015, 11:31 AMImportant Message  Patient Details  Name: Jesse Hoffman MRN: [id number] Date of Birth: 23-Oct-1935   Medicare Important Message Given:  Yes-second notification given    Nathen May 02/05/2015, 11:31 AM

## 2015-02-05 NOTE — Progress Notes (Signed)
[facility] PROGRESS NOTE  Jesse Hoffman TML:[id number] DOB: Jan 16, 1936 DOA: 02/01/2015 PCP: Merrilee Seashore, MD  Assessment/Plan:  Cholangitis -improving, cultures negative, change from Zosyn to CIpro/Flagyl -s/p ERCP and stone extraction 8/13 -Follow LFTs, bili still up -resume diet -cmet in am, ambulate  Anemia -3gm in hb since yesterday, dark stool yesterday, normal since -CT negative for bleed, hemoccult trace positive -transfused 1 unit PRBC 8/14, hb up to 11.6, suspect a lab error  Dyspnea -iatrogenic due to fluid resuscitation, resolved with 2 doses of IV lasix  AoCKD-III: Baseline Cre is 1.3 - Likely due to prerenal secondary to dehydration and continuation of ACEI and diuretics.  - creatinine stable, in 1.6 range now - hold ACEI and Diuretics  Hypokalemia -replace  Hypertension: -hold Triamterene and lisinopril given AKI -stable  Diet-controlled diabetes:  -hbiac 7.2, SSI  HLD:  -Continue home medications: Lipitor  GERD: -Protonix  DVT ppx: SCD  Code Status: Full Code Family Communication: wife Disposition Plan: home tomorrow   Consultants:  GI  Procedures:  ERCP: ERCP with stone extraction  HPI/Subjective: Feels better, wants top eat  Objective: Filed Vitals:   02/05/15 0523  BP: 118/53  Pulse: 58  Temp: 98.4 F (36.9 C)  Resp:     Intake/Output Summary (Last 24 hours) at 02/05/15 1108 Last data filed at 02/05/15 0859  Gross per 24 hour  Intake    825 ml  Output   2575 ml  Net  -1750 ml   Filed Weights   02/02/15 0238 02/02/15 0258 02/03/15 0821  Weight: 82.9 kg (182 lb 12.2 oz) 82.4 kg (181 lb 10.5 oz) 82.101 kg (181 lb)    Exam:   General:  AAOx3, in good spirits  Cardiovascular: S1S2/RRR  Respiratory: CTAB  Abdomen: soft, NT, BS present  Musculoskeletal: no edema c/c  Data Reviewed: Basic Metabolic Panel:  Recent Labs Lab 02/01/15 2148 02/02/15 0444 02/03/15 0746 02/04/15 0646  02/05/15 0604  NA 137 137 137 138 140  K 3.8 3.6 3.5 3.9 2.9*  CL 99* 103 107 109 106  CO2 26 24 23  20* 23  GLUCOSE 228* 239* 147* 131* 137*  BUN 23* 23* 17 15 10   CREATININE 1.53* 1.86* 1.78* 1.77* 1.62*  CALCIUM 9.2 8.2* 7.5* 7.2* 7.5*   Liver Function Tests:  Recent Labs Lab 02/01/15 2148 02/02/15 0444 02/03/15 0746 02/04/15 0646 02/05/15 0604  AST 563* 1337* 310* 122* 58*  ALT 333* 1093* 577* 347* 249*  ALKPHOS 191* 166* 118 122 132*  BILITOT 2.0* 3.1* 5.6* 4.4* 6.8*  PROT 7.1 6.0* 5.8* 5.8* 5.8*  ALBUMIN 4.1 3.4* 3.0* 2.7* 2.7*    Recent Labs Lab 02/01/15 2148 02/02/15 0444  LIPASE 74* 44   No results for input(s): AMMONIA in the last 168 hours. CBC:  Recent Labs Lab 02/01/15 2148 02/02/15 0444 02/03/15 0746 02/04/15 0646 02/05/15 0604  WBC 10.1 7.1 10.0 9.0 7.4  HGB 13.3 11.9* 10.9* 7.3* 11.6*  HCT 38.1* 35.0* 31.7* 20.7* 33.1*  MCV 89.2 89.1 89.8 87.0 88.5  PLT 183 145* 119* 140* 118*   Cardiac Enzymes: No results for input(s): CKTOTAL, CKMB, CKMBINDEX, TROPONINI in the last 168 hours. BNP (last 3 results) No results for input(s): BNP in the last 8760 hours.  ProBNP (last 3 results) No results for input(s): PROBNP in the last 8760 hours.  CBG:  Recent Labs Lab 02/04/15 0801 02/04/15 1150 02/04/15 1625 02/04/15 2133 02/05/15 0758  GLUCAP 124* 171* 135* 159* 126*    Recent Results (from  the past 240 hour(s))  Culture, blood (x 2)     Status: None (Preliminary result)   Collection Time: 02/02/15  4:44 AM  Result Value Ref Range Status   Specimen Description BLOOD LEFT ANTECUBITAL  Final   Special Requests BOTTLES DRAWN AEROBIC AND ANAEROBIC 10CC  Final   Culture NO GROWTH 2 DAYS  Final   Report Status PENDING  Incomplete  Culture, blood (x 2)     Status: None (Preliminary result)   Collection Time: 02/02/15  5:02 AM  Result Value Ref Range Status   Specimen Description BLOOD BLOOD RIGHT HAND  Final   Special Requests BOTTLES DRAWN  AEROBIC ONLY 3CC  Final   Culture NO GROWTH 2 DAYS  Final   Report Status PENDING  Incomplete     Studies: Ct Abdomen Pelvis Wo Contrast  02/04/2015   CLINICAL DATA:  79 year old male with anemia. Evaluate for intra-abdominal bleeding. Abdominal pain since ERCP.  EXAM: CT ABDOMEN AND PELVIS WITHOUT CONTRAST  TECHNIQUE: Multidetector CT imaging of the abdomen and pelvis was performed following the standard protocol without IV contrast.  COMPARISON:  CT of the abdomen and pelvis 02/01/2015.  FINDINGS: Lower chest: Small left and moderate right pleural effusions with associated passive atelectasis in the dependent portions of the lungs bilaterally. Thickening of the peribronchovascular interstitium in the right lower lobe, and to a lesser extent in the medial segment of the right middle lobe, which could reflect sequela of prior recent aspiration. Heart size is borderline enlarged. Extensive calcifications of the aortic valve. Atherosclerotic calcifications in the left anterior descending and left circumflex coronary arteries.  Hepatobiliary: Pneumobilia again noted, presumably from prior sphincterotomy. Status post cholecystectomy. No discrete cystic or solid hepatic lesion confidently identified on today's noncontrast CT examination.  Pancreas: No pancreatic mass or peripancreatic inflammatory changes on today's noncontrast CT examination.  Spleen: Trace amount of perisplenic ascites. Otherwise, unremarkable.  Adrenals/Urinary Tract: Bilateral adrenal glands are normal in appearance. Several low-attenuation lesions in the kidneys bilaterally, incompletely characterized on today's noncontrast CT examination, but similar to prior studies, favored to represent cysts, largest which measures 3.4 cm in the interpolar region of the right kidney. No hydroureteronephrosis. Unenhanced appearance of the urinary bladder is normal.  Stomach/Bowel: Unenhanced appearance of the stomach is normal. No pathologic dilatation of  small bowel or colon. Numerous colonic diverticulae are noted, most evident in the sigmoid colon, without surrounding inflammatory changes to suggest an acute diverticulitis at this time. Normal appendix.  Vascular/Lymphatic: Atherosclerosis throughout the abdominal and pelvic vasculature, without evidence of aneurysm. No lymphadenopathy noted in the abdomen or pelvis on today's noncontrast CT examination.  Reproductive: Prostate gland and seminal vesicles are unremarkable in appearance.  Other: Small volume of low-attenuation ascites noted, predominantly in the low anatomic pelvis. No high attenuation fluid collection in the retroperitoneum or peritoneal cavity to suggest significant postprocedural hemorrhage. No pneumoperitoneum.  Musculoskeletal: 1.3 cm sclerotic lesion with narrow zone of transition in the left ischium is unchanged compared to prior examinations, presumably a tiny bone island. There are no aggressive appearing lytic or blastic lesions noted in the visualized portions of the skeleton.  IMPRESSION: 1. No findings to suggest significant hemorrhage in the abdomen or pelvis. 2. Small volume of ascites. 3. Colonic diverticulosis without evidence of acute diverticulitis at this time. 4. Moderate right and small left pleural effusions lying dependently, with some associated passive atelectasis in the lower lobes of the lungs bilaterally. There are additional findings in the right lower lobe that  could indicate sequela of recent aspiration. 5. Atherosclerosis, including at least 2 vessel coronary artery disease. Assessment for potential risk factor modification, dietary therapy or pharmacologic therapy may be warranted, if clinically indicated. 6. There are calcifications of the aortic valve. Echocardiographic correlation for evaluation of potential valvular dysfunction may be warranted if clinically indicated. 7. Normal appendix. 8. Additional incidental findings, as above.   Electronically Signed   By:  Vinnie Langton M.D.   On: 02/04/2015 14:25    Scheduled Meds: . ciprofloxacin  500 mg Oral BID  . insulin aspart  0-9 Units Subcutaneous TID WC  . metroNIDAZOLE  500 mg Oral 3 times per day  . pantoprazole  40 mg Oral Q0600  . potassium chloride  40 mEq Oral Q2H   Continuous Infusions:   Antibiotics Given (last 72 hours)    Date/Time Action Medication Dose Rate   02/02/15 1734 Given   piperacillin-tazobactam (ZOSYN) IVPB 3.375 g 3.375 g 12.5 mL/hr   02/03/15 0132 Given   piperacillin-tazobactam (ZOSYN) IVPB 3.375 g 3.375 g 12.5 mL/hr   02/03/15 1145 Given   piperacillin-tazobactam (ZOSYN) IVPB 3.375 g 3.375 g 12.5 mL/hr   02/03/15 1754 Given   piperacillin-tazobactam (ZOSYN) IVPB 3.375 g 3.375 g 12.5 mL/hr   02/04/15 0200 Given   piperacillin-tazobactam (ZOSYN) IVPB 3.375 g 3.375 g 12.5 mL/hr   02/04/15 0946 Given   piperacillin-tazobactam (ZOSYN) IVPB 3.375 g 3.375 g 12.5 mL/hr   02/04/15 1737 Given   piperacillin-tazobactam (ZOSYN) IVPB 3.375 g 3.375 g 12.5 mL/hr   02/05/15 0230 Given   piperacillin-tazobactam (ZOSYN) IVPB 3.375 g 3.375 g 12.5 mL/hr   02/05/15 1026 Given   piperacillin-tazobactam (ZOSYN) IVPB 3.375 g 3.375 g 12.5 mL/hr      Principal Problem:   Abdominal pain Active Problems:   Elevated liver enzymes   Hypertension   Diabetes mellitus without complication   HLD (hyperlipidemia)   GERD (gastroesophageal reflux disease)   Acute renal failure superimposed on stage 3 chronic kidney disease    Time spent:10min    [facility]  [facility] Pager [phone]. If 7PM-7AM, please contact night-coverage at [email or web address], password [facility] 02/05/2015, 11:08 AM  LOS: 3 days

## 2015-02-06 ENCOUNTER — Encounter (HOSPITAL_COMMUNITY): Payer: Self-pay | Admitting: Physician Assistant

## 2015-02-06 LAB — COMPREHENSIVE METABOLIC PANEL
ALT: 192 U/L — ABNORMAL HIGH (ref 17–63)
AST: 47 U/L — ABNORMAL HIGH (ref 15–41)
Albumin: 2.8 g/dL — ABNORMAL LOW (ref 3.5–5.0)
Alkaline Phosphatase: 151 U/L — ABNORMAL HIGH (ref 38–126)
Anion gap: 11 (ref 5–15)
BUN: 11 mg/dL (ref 6–20)
CHLORIDE: 102 mmol/L (ref 101–111)
CO2: 27 mmol/L (ref 22–32)
Calcium: 7.9 mg/dL — ABNORMAL LOW (ref 8.9–10.3)
Creatinine, Ser: 1.47 mg/dL — ABNORMAL HIGH (ref 0.61–1.24)
GFR, EST AFRICAN AMERICAN: 52 mL/min — AB (ref >60)
GFR, EST NON AFRICAN AMERICAN: 45 mL/min — AB (ref >60)
Glucose, Bld: 134 mg/dL — ABNORMAL HIGH (ref 65–99)
POTASSIUM: 3.3 mmol/L — AB (ref 3.5–5.1)
Sodium: 140 mmol/L (ref 135–145)
TOTAL PROTEIN: 5.9 g/dL — AB (ref 6.5–8.1)
Total Bilirubin: 4.9 mg/dL — ABNORMAL HIGH (ref 0.3–1.2)

## 2015-02-06 LAB — CBC
HCT: 35.9 % — ABNORMAL LOW (ref 39.0–52.0)
Hemoglobin: 12.4 g/dL — ABNORMAL LOW (ref 13.0–17.0)
MCH: 30.8 pg (ref 26.0–34.0)
MCHC: 34.5 g/dL (ref 30.0–36.0)
MCV: 89.3 fL (ref 78.0–100.0)
PLATELETS: 135 10*3/uL — AB (ref 150–400)
RBC: 4.02 MIL/uL — ABNORMAL LOW (ref 4.22–5.81)
RDW: 13 % (ref 11.5–15.5)
WBC: 5.6 10*3/uL (ref 4.0–10.5)

## 2015-02-06 LAB — GLUCOSE, CAPILLARY
GLUCOSE-CAPILLARY: 134 mg/dL — AB (ref 65–99)
GLUCOSE-CAPILLARY: 177 mg/dL — AB (ref 65–99)

## 2015-02-06 MED ORDER — POTASSIUM CHLORIDE CRYS ER 20 MEQ PO TBCR
40.0000 meq | EXTENDED_RELEASE_TABLET | Freq: Once | ORAL | Status: AC
  Administered 2015-02-06: 40 meq via ORAL
  Filled 2015-02-06: qty 2

## 2015-02-06 MED ORDER — METRONIDAZOLE 500 MG PO TABS: 500.0000 mg | ORAL_TABLET | Freq: Three times a day (TID) | ORAL | Status: DC

## 2015-02-06 MED ORDER — CIPROFLOXACIN HCL 500 MG PO TABS: 500.0000 mg | ORAL_TABLET | Freq: Two times a day (BID) | ORAL | Status: DC

## 2015-02-06 MED ORDER — ATORVASTATIN CALCIUM 40 MG PO TABS: 40.0000 mg | ORAL_TABLET | Freq: Every day | ORAL | Status: AC

## 2015-02-06 NOTE — Progress Notes (Signed)
Utilization Review completed. Edyn Popoca RN BSN CM 

## 2015-02-06 NOTE — Progress Notes (Signed)
Daily Rounding Note  02/06/2015, 8:50 AM  LOS: 4 days   SUBJECTIVE:       Feels well .  Tolerating solids.  No abdominal pain, no weakness.  No SOB or chest pain.    OBJECTIVE:         Vital signs in last 24 hours:    Temp:  [98 F (36.7 C)-98.5 F (36.9 C)] 98.5 F (36.9 C) (08/16 0529) Pulse Rate:  [58-68] 58 (08/16 0529) Resp:  [16-20] 18 (08/16 0529) BP: (144-162)/(75-85) 150/77 mmHg (08/16 0529) SpO2:  [94 %-96 %] 94 % (08/16 0529) Last BM Date: 02/02/15 Filed Weights   02/02/15 0238 02/02/15 0258 02/03/15 0821  Weight: 182 lb 12.2 oz (82.9 kg) 181 lb 10.5 oz (82.4 kg) 181 lb (82.101 kg)   General: pleasant, comfortable .  Looks well.   Heart: RRR Chest: clear bil.  No cough or dyspnea. Abdomen: soft, NT, ND.  Active BS  Extremities: no CCE Neuro/Psych:  Pleasant, alert, no tremor of gross weakness.   Intake/Output from previous day: 08/15 0701 - 08/16 0700 In: 720 [P.O.:720] Out: 1050 [Urine:1050]  Intake/Output this shift:    Lab Results:  Recent Labs  02/04/15 0646 02/05/15 0604 02/06/15 0630  WBC 9.0 7.4 5.6  HGB 7.3* 11.6* 12.4*  HCT 20.7* 33.1* 35.9*  PLT 140* 118* 135*   BMET  Recent Labs  02/04/15 0646 02/05/15 0604  NA 138 140  K 3.9 2.9*  CL 109 106  CO2 20* 23  GLUCOSE 131* 137*  BUN 15 10  CREATININE 1.77* 1.62*  CALCIUM 7.2* 7.5*   LFT  Recent Labs  02/04/15 0646 02/05/15 0604  PROT 5.8* 5.8*  ALBUMIN 2.7* 2.7*  AST 122* 58*  ALT 347* 249*  ALKPHOS 122 132*  BILITOT 4.4* 6.8*   PT/INR No results for input(s): LABPROT, INR in the last 72 hours. Hepatitis Panel No results for input(s): HEPBSAG, HCVAB, HEPAIGM, HEPBIGM in the last 72 hours.  Studies/Results: Ct Abdomen Pelvis Wo Contrast  02/04/2015   CLINICAL DATA:  79 year old male with anemia. Evaluate for intra-abdominal bleeding. Abdominal pain since ERCP.  EXAM: CT ABDOMEN AND PELVIS WITHOUT CONTRAST   TECHNIQUE: Multidetector CT imaging of the abdomen and pelvis was performed following the standard protocol without IV contrast.  COMPARISON:  CT of the abdomen and pelvis 02/01/2015.  FINDINGS: Lower chest: Small left and moderate right pleural effusions with associated passive atelectasis in the dependent portions of the lungs bilaterally. Thickening of the peribronchovascular interstitium in the right lower lobe, and to a lesser extent in the medial segment of the right middle lobe, which could reflect sequela of prior recent aspiration. Heart size is borderline enlarged. Extensive calcifications of the aortic valve. Atherosclerotic calcifications in the left anterior descending and left circumflex coronary arteries.  Hepatobiliary: Pneumobilia again noted, presumably from prior sphincterotomy. Status post cholecystectomy. No discrete cystic or solid hepatic lesion confidently identified on today's noncontrast CT examination.  Pancreas: No pancreatic mass or peripancreatic inflammatory changes on today's noncontrast CT examination.  Spleen: Trace amount of perisplenic ascites. Otherwise, unremarkable.  Adrenals/Urinary Tract: Bilateral adrenal glands are normal in appearance. Several low-attenuation lesions in the kidneys bilaterally, incompletely characterized on today's noncontrast CT examination, but similar to prior studies, favored to represent cysts, largest which measures 3.4 cm in the interpolar region of the right kidney. No hydroureteronephrosis. Unenhanced appearance of the urinary bladder is normal.  Stomach/Bowel: Unenhanced appearance of the stomach  is normal. No pathologic dilatation of small bowel or colon. Numerous colonic diverticulae are noted, most evident in the sigmoid colon, without surrounding inflammatory changes to suggest an acute diverticulitis at this time. Normal appendix.  Vascular/Lymphatic: Atherosclerosis throughout the abdominal and pelvic vasculature, without evidence of  aneurysm. No lymphadenopathy noted in the abdomen or pelvis on today's noncontrast CT examination.  Reproductive: Prostate gland and seminal vesicles are unremarkable in appearance.  Other: Small volume of low-attenuation ascites noted, predominantly in the low anatomic pelvis. No high attenuation fluid collection in the retroperitoneum or peritoneal cavity to suggest significant postprocedural hemorrhage. No pneumoperitoneum.  Musculoskeletal: 1.3 cm sclerotic lesion with narrow zone of transition in the left ischium is unchanged compared to prior examinations, presumably a tiny bone island. There are no aggressive appearing lytic or blastic lesions noted in the visualized portions of the skeleton.  IMPRESSION: 1. No findings to suggest significant hemorrhage in the abdomen or pelvis. 2. Small volume of ascites. 3. Colonic diverticulosis without evidence of acute diverticulitis at this time. 4. Moderate right and small left pleural effusions lying dependently, with some associated passive atelectasis in the lower lobes of the lungs bilaterally. There are additional findings in the right lower lobe that could indicate sequela of recent aspiration. 5. Atherosclerosis, including at least 2 vessel coronary artery disease. Assessment for potential risk factor modification, dietary therapy or pharmacologic therapy may be warranted, if clinically indicated. 6. There are calcifications of the aortic valve. Echocardiographic correlation for evaluation of potential valvular dysfunction may be warranted if clinically indicated. 7. Normal appendix. 8. Additional incidental findings, as above.   Electronically Signed   By: Vinnie Langton M.D.   On: 02/04/2015 14:25   Scheduled Meds: . ciprofloxacin  500 mg Oral BID  . insulin aspart  0-9 Units Subcutaneous TID WC  . metroNIDAZOLE  500 mg Oral 3 times per day  . pantoprazole  40 mg Oral Q0600   Continuous Infusions:  PRN Meds:.acetaminophen, morphine injection,  ondansetron (ZOFRAN) IV, ondansetron (ZOFRAN) IV, oxyCODONE **OR** oxyCODONE   ASSESMENT:   * Choledocholithiasis. Previous cholecystectomy and ERCP 2006.  CT Abd/Pelvis 02/01/15: Chronic pneumobilia increased c/w 2013. 8 mm cystic lesion in proximal body of pancreas with associated calcification. Diverticulosis. Renal cysts. Calcifacation of aorta and branches. Prostamegaly.  Abd [place] 02/02/15: s/p chole. 4.8 mm CBD. Unremarkable,normal, but incompletely visd, pancreas,  02/03/15 ERCP with Sphincterotomy and CBD stone removal.  MRCP 02/02/15: persistent pneumobilia, filling defect in distal CBD. Simple, 8 mm cyst in head of pancreas, no communication with PD, suggest 12 month follow up MRI, with and without contrast.  ERCP 02/03/15: ampulla within large diverticulum, 1 cm stone and CBD dilated to 1 cm. S/p limited sphincterotomy and stone extraction.  LFTs steadily normalizing except for the rising T bili, which can happen in setting of renal insufficiency. .   * Cholangitis. Day 2 Cipro/Flagyl po, day 5 abx (zosyn/vanco to start) . Normal WBCs since arrival. No recurrent fevers or pain. No growth on blood Clx's.   * Acute, normocytic anemia. 8/14 FOBT +.  S/p PRBC x 1 on 8/14 with response well beyond norm (up 4 grams).  CT abdomen/pelvis 8/14: small ascites, no hematoma or Source for bleeding. ? Limited post sphincterotomy bleed.  Hgb improving.   * Thrombocytopenia. Acute. Non-critical.  Improving.   * Hypokalemia, .   * Type 2 DM.    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  Rescope due 09/2015.    PLAN   *  2 more days of po cipro/flagyl.    *  CMET in pending.    *  Discharge to home today.   *  Prn follow up with GI.  Would have LFTs rechecked in 10 to 14 days at [place] office ([place], Dr Merrilee Seashore). If abnormal can contact GI for guidance/appt.     Azucena Freed  02/06/2015, 8:50 AM Pager: [phone]  GI Attending Note  I have  personally taken an interval history, reviewed the chart, and examined the patient.  I agree with the extender's note, impression and recommendations.  Sandy Salaam. Deatra Ina, MD, [facility] [phone]

## 2015-02-06 NOTE — Progress Notes (Signed)
Pt i.v site red d/ced i.v pt requested not to place another one as he may go home today. No drainage noted dry dressing placed over site.

## 2015-02-06 NOTE — Care Management Note (Signed)
Case Management Note  Patient Details  Name: Jesse Hoffman MRN: [id number] Date of Birth: [birth year]  Subjective/Objective:                 Patient from home with wife, independent. No home needs identified.   Action/Plan:  Discharge to home; self care.  Expected Discharge Date:  02/04/15               Expected Discharge Plan:  Home/Self Care  In-House Referral:     Discharge planning Services  CM Consult  Post Acute Care Choice:    Choice offered to:     DME Arranged:    DME Agency:     HH Arranged:    HH Agency:     Status of Service:  Completed, signed off  Medicare Important Message Given:  Yes-second notification given Date Medicare IM Given:    Medicare IM give by:    Date Additional Medicare IM Given:    Additional Medicare Important Message give by:     If discussed at [place] of Stay Meetings, dates discussed:    Additional Comments:  Carles Collet, RN 02/06/2015, 3:12 PM

## 2015-02-06 NOTE — Discharge Summary (Signed)
Physician Discharge Summary  Jesse Hoffman:[id number] DOB: March 15, 1936 DOA: 02/01/2015  PCP: Merrilee Seashore, MD  Admit date: 02/01/2015 Discharge date: 02/06/2015  Time spent: 45 minutes  Recommendations for Outpatient Follow-up:  1. Dr.Ramachandran in 1 week, please check Lfts and creatinine at Fu and Resume Statin, stop metformin if creatinine worsens   Discharge Diagnoses:  Principal Problem:   Acute cholangitis   Choledocholithiasis   Elevated liver enzymes   Hypertension   Diabetes mellitus without complication   HLD (hyperlipidemia)   GERD (gastroesophageal reflux disease)   Acute renal failure superimposed on stage 3 chronic kidney disease   Gall stones, common bile duct   Discharge Condition: stable  Diet recommendation: heart healthy  Filed Weights   02/02/15 0238 02/02/15 0258 02/03/15 0821  Weight: 82.9 kg (182 lb 12.2 oz) 82.4 kg (181 lb 10.5 oz) 82.101 kg (181 lb)    History of present illness:  Chief Complaint: Abdominal pain HPI: Jesse Hoffman is a 79 y.o. male with PMH of ascending cholangitis, cholecystitis, s/p of ERCP sphincterectomy and cholecystectomy, hypertension, hyperlipidemia, diet diabetes mellitus, GERD, gout, CKD-III, who presented with abdominal pain. Patient reported that he started having abdominal pain at 5:30 PM, located in epigastric area, constant, 8 out of 10 in severity, nonradiating, aggravated by movement.  Hospital Course:  Cholangitis -improving, cultures negative, was started on Iv Zosyn on admission -s/p ERCP and stone extraction 8/13 -LFTs and bili improving -Abx changed to Cipro/Flagyl, will continue for 2 more days for 7day course -resumed diet  Anemia -3gm in hb day after ERCP-spurious lab, some report of dark stool 8/14, normal since -CT negative for bleed, hemoccult trace positive -transfused 1 unit PRBC 8/14, hb up to 11.6 after 1 Unit PRBC from 7.3, i suspect the 7.3 was a lab  error  Dyspnea -iatrogenic due to fluid resuscitation, resolved with 2 doses of IV lasix -stable, at this time doesn't need further lasix  AoCKD-III: Baseline Cre is 1.3 - Likely due to prerenal secondary to dehydration and continuation of ACEI and diuretics.  - creatinine improved to 1.4 at discharge - stopped ACE, resumed HCTZ  Hypokalemia -replaced  Hypertension: -held Triamterene and lisinopril given AKI -stable  Diet-controlled diabetes:  -hbiac 7.2, monitor reanl function and stop metformin if kidney functions deteriorates further  HLD:  -Continue home medications: Lipitor  GERD: -Protonix  Procedures: IMPRESSIONS: 1) Choledocholithiasis s/p successful ERCP and stone extraction 2) Cholangitis.   Consultations:  Rudolpho Sevin and Dr.Hung  Discharge Exam: Filed Vitals:   02/06/15 1401  BP: 138/81  Pulse: 71  Temp: 98.5 F (36.9 C)  Resp: 20    General: AAOx3 Cardiovascular: S1S2/RRR Respiratory: CTAB  Discharge Instructions   Discharge Instructions    Diet - low sodium heart healthy    Complete by:  As directed      Increase activity slowly    Complete by:  As directed           Current Discharge Medication List    START taking these medications   Details  ciprofloxacin (CIPRO) 500 MG tablet Take 1 tablet (500 mg total) by mouth 2 (two) times daily. For 2days Qty: 4 tablet, Refills: 0    metroNIDAZOLE (FLAGYL) 500 MG tablet Take 1 tablet (500 mg total) by mouth every 8 (eight) hours. For 2days Qty: 6 tablet, Refills: 0      CONTINUE these medications which have CHANGED   Details  atorvastatin (LIPITOR) 40 MG tablet Take 1 tablet (40 mg total)  by mouth daily at 6 PM. Restart after 1 week, when Liver function tests normalize      CONTINUE these medications which have NOT CHANGED   Details  losartan (COZAAR) 50 MG tablet Take 50 mg by mouth daily.    metFORMIN (GLUCOPHAGE-XR) 500 MG 24 hr tablet Take 500 mg by mouth daily with  breakfast.    omeprazole (PRILOSEC) 20 MG capsule Take 20 mg by mouth daily.    triamterene-hydrochlorothiazide (MAXZIDE) 75-50 MG per tablet Take 1 tablet by mouth daily.      STOP taking these medications     lisinopril (PRINIVIL,ZESTRIL) 30 MG tablet        Allergies  Allergen Reactions  . Allopurinol     hepatotoxicity      The results of significant diagnostics from this hospitalization (including imaging, microbiology, ancillary and laboratory) are listed below for reference.    Significant Diagnostic Studies: Ct Abdomen Pelvis Wo Contrast  02/04/2015   CLINICAL DATA:  79 year old male with anemia. Evaluate for intra-abdominal bleeding. Abdominal pain since ERCP.  EXAM: CT ABDOMEN AND PELVIS WITHOUT CONTRAST  TECHNIQUE: Multidetector CT imaging of the abdomen and pelvis was performed following the standard protocol without IV contrast.  COMPARISON:  CT of the abdomen and pelvis 02/01/2015.  FINDINGS: Lower chest: Small left and moderate right pleural effusions with associated passive atelectasis in the dependent portions of the lungs bilaterally. Thickening of the peribronchovascular interstitium in the right lower lobe, and to a lesser extent in the medial segment of the right middle lobe, which could reflect sequela of prior recent aspiration. Heart size is borderline enlarged. Extensive calcifications of the aortic valve. Atherosclerotic calcifications in the left anterior descending and left circumflex coronary arteries.  Hepatobiliary: Pneumobilia again noted, presumably from prior sphincterotomy. Status post cholecystectomy. No discrete cystic or solid hepatic lesion confidently identified on today's noncontrast CT examination.  Pancreas: No pancreatic mass or peripancreatic inflammatory changes on today's noncontrast CT examination.  Spleen: Trace amount of perisplenic ascites. Otherwise, unremarkable.  Adrenals/Urinary Tract: Bilateral adrenal glands are normal in appearance.  Several low-attenuation lesions in the kidneys bilaterally, incompletely characterized on today's noncontrast CT examination, but similar to prior studies, favored to represent cysts, largest which measures 3.4 cm in the interpolar region of the right kidney. No hydroureteronephrosis. Unenhanced appearance of the urinary bladder is normal.  Stomach/Bowel: Unenhanced appearance of the stomach is normal. No pathologic dilatation of small bowel or colon. Numerous colonic diverticulae are noted, most evident in the sigmoid colon, without surrounding inflammatory changes to suggest an acute diverticulitis at this time. Normal appendix.  Vascular/Lymphatic: Atherosclerosis throughout the abdominal and pelvic vasculature, without evidence of aneurysm. No lymphadenopathy noted in the abdomen or pelvis on today's noncontrast CT examination.  Reproductive: Prostate gland and seminal vesicles are unremarkable in appearance.  Other: Small volume of low-attenuation ascites noted, predominantly in the low anatomic pelvis. No high attenuation fluid collection in the retroperitoneum or peritoneal cavity to suggest significant postprocedural hemorrhage. No pneumoperitoneum.  Musculoskeletal: 1.3 cm sclerotic lesion with narrow zone of transition in the left ischium is unchanged compared to prior examinations, presumably a tiny bone island. There are no aggressive appearing lytic or blastic lesions noted in the visualized portions of the skeleton.  IMPRESSION: 1. No findings to suggest significant hemorrhage in the abdomen or pelvis. 2. Small volume of ascites. 3. Colonic diverticulosis without evidence of acute diverticulitis at this time. 4. Moderate right and small left pleural effusions lying dependently, with some associated passive  atelectasis in the lower lobes of the lungs bilaterally. There are additional findings in the right lower lobe that could indicate sequela of recent aspiration. 5. Atherosclerosis, including at least  2 vessel coronary artery disease. Assessment for potential risk factor modification, dietary therapy or pharmacologic therapy may be warranted, if clinically indicated. 6. There are calcifications of the aortic valve. Echocardiographic correlation for evaluation of potential valvular dysfunction may be warranted if clinically indicated. 7. Normal appendix. 8. Additional incidental findings, as above.   Electronically Signed   By: Vinnie Langton M.D.   On: 02/04/2015 14:25   US Abdomen Complete  02/02/2015   CLINICAL DATA:  Abdominal pain.  EXAM: ULTRASOUND ABDOMEN COMPLETE  COMPARISON:  CT 02/01/2015 .  FINDINGS: Gallbladder: Cholecystectomy.  Common bile duct: Diameter: 4.8 mm  Liver: No focal lesion identified. Within normal limits in parenchymal echogenicity.  IVC: No abnormality visualized.  Pancreas: Visualized portion unremarkable.Reference is made to prior CT report of 02/01/2015 which demonstrated at tiny pancreatic lesion.  Spleen: Size and appearance within normal limits.  Right Kidney: Length: 10.9 cm. Echogenicity within normal limits. No significant mass or hydronephrosis visualized. Multiple small right renal cysts noted as noted on prior CT . Tiny lower pole nonobstructing caliceal stone.  Left Kidney: Length: 11.9 cm. Echogenicity within normal limits. No mass or hydronephrosis visualized. Tiny lower pole nonobstructing caliceal stone.  Abdominal aorta: No aneurysm visualized.  Other findings: None.  IMPRESSION: 1. Cholecystectomy. No biliary distention. 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 2. No focal abnormality noted in the visualized portions of the pancreas. Reference however is made to prior CT report of 02/01/2015 which demonstrated tiny pancreatic lesion. 3. Bilateral nonobstructing tiny renal caliceal stones.   Electronically Signed   By: Marcello Moores  Register   On: 02/02/2015 07:05   Ct Abdomen Pelvis W Contrast  02/02/2015   CLINICAL DATA:  Acute onset of epigastric  abdominal pain and nausea. Initial encounter.  EXAM: CT ABDOMEN AND PELVIS WITH CONTRAST  TECHNIQUE: Multidetector CT imaging of the abdomen and pelvis was performed using the standard protocol following bolus administration of intravenous contrast.  CONTRAST:  65mL OMNIPAQUE IOHEXOL 300 MG/ML  SOLN  COMPARISON:  CT of the abdomen and pelvis performed 01/20/2012, abdominal ultrasound performed 01/19/2012, and MRCP performed 01/21/2012  FINDINGS: Minimal bibasilar atelectasis is noted.  Diffuse pneumobilia is noted. This likely reflects prior instrumentation at the duodenal ampulla. The patient is status post cholecystectomy. The liver and spleen are otherwise grossly unremarkable. The adrenal glands are grossly unremarkable. An 8 mm cystic focus is noted at the proximal body of the pancreas, with minimal adjacent calcification. This is of uncertain significance.  Scattered bilateral renal cysts are seen. Nonspecific perinephric stranding is noted bilaterally. The kidneys are otherwise unremarkable in appearance. There is no evidence of hydronephrosis. No renal or ureteral stones are identified. Mild prominence of the left ureter is thought remain within normal limits.  No free fluid is identified. The small bowel is unremarkable in appearance. The stomach is within normal limits. No acute vascular abnormalities are seen. Scattered calcification is noted along the abdominal aorta and its branches.  The appendix is normal in caliber and contains air, without evidence of appendicitis. Scattered diverticulosis is noted along the ascending and proximal transverse colon, and along the sigmoid colon, without evidence of diverticulitis.  The bladder is mildly distended and grossly unremarkable. A small urachal remnant is incidentally noted. An enlarged prostate is noted, measuring 6.3 cm in transverse dimension. No inguinal  lymphadenopathy is seen.  No acute osseous abnormalities are identified.  IMPRESSION: 1. Diffuse  pneumobilia noted, more prominent than in 2013 but apparently chronic in nature. This likely reflects prior instrumentation at the duodenal ampulla. 2. New 8 mm cystic focus at the proximal body of the pancreas, with minimal adjacent calcification. This is of uncertain significance. MRCP could be considered for further evaluation, on an elective nonemergent basis. 3. Scattered bilateral renal cysts seen. 4. Scattered calcification along the abdominal aorta and its branches. 5. Scattered diverticulosis along the ascending and proximal transverse colon, and along the sigmoid colon, without evidence of diverticulitis. 6. Enlarged prostate noted.   Electronically Signed   By: Garald Balding M.D.   On: 02/02/2015 00:22   Mr Abdomen Mrcp Wo Cm  02/02/2015   CLINICAL DATA:  Subsequent encounter for 8 mm cystic lesion in the head of the pancreas, new on recent CT scan.  EXAM: MRI ABDOMEN WITHOUT CONTRAST  (INCLUDING MRCP)  TECHNIQUE: Multiplanar multisequence MR imaging of the abdomen was performed. Heavily T2-weighted images of the biliary and pancreatic ducts were obtained, and three-dimensional MRCP images were rendered by post processing.  COMPARISON:  CT from 02/01/2015.  CT from 01/20/2012.  FINDINGS: Lower chest: Probable dependent atelectasis in the lungs bilaterally.  Hepatobiliary: No focal intraparenchymal abnormality on this study performed without intravenous contrast material. Pneumobilia is evident. The extrahepatic common duct measures up to 11 mm in diameter.  Gallbladder is surgically absent. The extrahepatic common duct measures up to about 11 mm in diameter. Fluid fluid or fluid debris level in the duct is evident. There is a low signal intensity apparent filling defect in the distal common bile duct, at the level of the pancreatic head, just proximal to the ampulla. This measures approximately 7 x 8 mm on image 30 series 7).  Pancreas: 8 mm homogeneous T2 hyperintense lesion is identified in the head  of the pancreas. No direct communication to the main pancreatic duct is evident. No associated dilatation of the main pancreatic duct.  Spleen: No splenomegaly. No focal mass lesion.  Adrenals/Urinary Tract: No adrenal nodule or mass. 3.3 cm well-defined homogeneous T1 hypo intense, T2 hyperintense lesion in the interpolar right kidney is compatible with a simple cyst. Other tiny cystic lesions are seen in the right renal parenchyma. 15 mm simple cyst is identified in the interpolar left kidney with other tiny cystic left renal lesions associated. Neither kidney has been fully evaluated on this study.  Stomach/Bowel: Stomach is nondistended. No gastric wall thickening. No evidence of outlet obstruction. Visualize small bowel and colon of the upper abdomen is unremarkable.  Vascular/Lymphatic: There is no gastrohepatic or hepatoduodenal ligament lymphadenopathy. No intraperitoneal or retroperitoneal lymphadenopy.  Other: No abdominal aortic aneurysm. No abdominal atherosclerotic calcification.  Musculoskeletal: No abnormal marrow signal within the visualized bony anatomy.  IMPRESSION: Mild extrahepatic biliary duct dilatation in this patient status post cholecystectomy. The presence of pneumobilia suggest previous sphincterotomy. On today's exam, there is a 7 x 8 mm filling defect in the distal common bile duct, just proximal to the ampulla. Imaging features are suspicious for a distal common bile duct stone.  8 mm simple cyst identified in the pancreatic head. No communication to the main pancreatic duct is evident. Followup MRI without and with contrast in 12 months is recommended. This recommendation follows ACR consensus guidelines: Managing Incidental Findings on Abdominal CT: White Paper of the ACR Incidental Findings Committee. J Am Coll Radiol 2010;7:754-773  Bilateral renal cysts.  Electronically Signed   By: Misty Stanley M.D.   On: 02/02/2015 14:21   Dg Ercp Biliary & Pancreatic Ducts  02/03/2015    CLINICAL DATA:  Gallstones, CBD exploration, sphincterotomy  EXAM: ERCP  TECHNIQUE: Multiple spot images obtained with the fluoroscopic device and submitted for interpretation post-procedure.  FLUOROSCOPY TIME:  If the device does not provide the exposure index:  Fluoroscopy Time:  31 seconds  Number of Acquired Images:  4  COMPARISON:  None.  FINDINGS: Four intraprocedural spot images are provided. There is cannulation of the common bile duct. There is opacification of the common bile duct . There is mild irregularity of the distal common bile duct which does not persist on subsequent images likely reflecting mild spasm. There is a filling defect on the first image within the distal common bile duct which may represent air versus gallstone.  IMPRESSION: Status post ERCP as detailed above.  These images were submitted for radiologic interpretation only. Please see the procedural report for the amount of contrast and the fluoroscopy time utilized.   Electronically Signed   By: Kathreen Devoid   On: 02/03/2015 10:25   Dg Abd Acute W/chest  02/02/2015   CLINICAL DATA:  Abdominal pain and nausea  EXAM: DG ABDOMEN ACUTE W/ 1V CHEST  COMPARISON:  Chest radiograph January 19, 2012; CT abdomen and pelvis January 20, 2012  FINDINGS: PA chest: No edema or consolidation. Heart size and pulmonary vascularity are normal. No adenopathy. There is atherosclerotic change in the aorta.  Supine and upright abdomen: There is moderate stool throughout the colon. There is no bowel dilatation or air-fluid level suggesting obstruction. No free air. There are surgical clips in the right upper quadrant.  IMPRESSION: Bowel gas pattern unremarkable.  No edema or consolidation.   Electronically Signed   By: Lowella Grip III M.D.   On: 02/02/2015 00:18    Microbiology: Recent Results (from the past 240 hour(s))  Culture, blood (x 2)     Status: None (Preliminary result)   Collection Time: 02/02/15  4:44 AM  Result Value Ref Range Status    Specimen Description BLOOD LEFT ANTECUBITAL  Final   Special Requests BOTTLES DRAWN AEROBIC AND ANAEROBIC 10CC  Final   Culture NO GROWTH 4 DAYS  Final   Report Status PENDING  Incomplete  Culture, blood (x 2)     Status: None (Preliminary result)   Collection Time: 02/02/15  5:02 AM  Result Value Ref Range Status   Specimen Description BLOOD BLOOD RIGHT HAND  Final   Special Requests BOTTLES DRAWN AEROBIC ONLY 3CC  Final   Culture NO GROWTH 4 DAYS  Final   Report Status PENDING  Incomplete     Labs: Basic Metabolic Panel:  Recent Labs Lab 02/02/15 0444 02/03/15 0746 02/04/15 0646 02/05/15 0604 02/06/15 0630  NA 137 137 138 140 140  K 3.6 3.5 3.9 2.9* 3.3*  CL 103 107 109 106 102  CO2 24 23 20* 23 27  GLUCOSE 239* 147* 131* 137* 134*  BUN 23* 17 15 10 11   CREATININE 1.86* 1.78* 1.77* 1.62* 1.47*  CALCIUM 8.2* 7.5* 7.2* 7.5* 7.9*   Liver Function Tests:  Recent Labs Lab 02/02/15 0444 02/03/15 0746 02/04/15 0646 02/05/15 0604 02/06/15 0630  AST 1337* 310* 122* 58* 47*  ALT 1093* 577* 347* 249* 192*  ALKPHOS 166* 118 122 132* 151*  BILITOT 3.1* 5.6* 4.4* 6.8* 4.9*  PROT 6.0* 5.8* 5.8* 5.8* 5.9*  ALBUMIN 3.4* 3.0* 2.7* 2.7*  2.8*    Recent Labs Lab 02/01/15 2148 02/02/15 0444  LIPASE 74* 44   No results for input(s): AMMONIA in the last 168 hours. CBC:  Recent Labs Lab 02/02/15 0444 02/03/15 0746 02/04/15 0646 02/05/15 0604 02/06/15 0630  WBC 7.1 10.0 9.0 7.4 5.6  HGB 11.9* 10.9* 7.3* 11.6* 12.4*  HCT 35.0* 31.7* 20.7* 33.1* 35.9*  MCV 89.1 89.8 87.0 88.5 89.3  PLT 145* 119* 140* 118* 135*   Cardiac Enzymes: No results for input(s): CKTOTAL, CKMB, CKMBINDEX, TROPONINI in the last 168 hours. BNP: BNP (last 3 results) No results for input(s): BNP in the last 8760 hours.  ProBNP (last 3 results) No results for input(s): PROBNP in the last 8760 hours.  CBG:  Recent Labs Lab 02/05/15 1227 02/05/15 1637 02/05/15 2113 02/06/15 0810  02/06/15 1141  GLUCAP 241* 101* 180* 134* 177*       Signed:  Kemper Hochman  [facility] 02/06/2015, 2:21 PM

## 2015-02-07 LAB — CULTURE, BLOOD (ROUTINE X 2)
CULTURE: NO GROWTH
CULTURE: NO GROWTH

## 2015-02-12 DIAGNOSIS — I10 Essential (primary) hypertension: Secondary | ICD-10-CM | POA: Diagnosis not present

## 2015-02-12 DIAGNOSIS — M1000 Idiopathic gout, unspecified site: Secondary | ICD-10-CM | POA: Diagnosis not present

## 2015-02-14 DIAGNOSIS — E1165 Type 2 diabetes mellitus with hyperglycemia: Secondary | ICD-10-CM | POA: Diagnosis not present

## 2015-02-14 DIAGNOSIS — E785 Hyperlipidemia, unspecified: Secondary | ICD-10-CM | POA: Diagnosis not present

## 2015-02-14 DIAGNOSIS — N183 Chronic kidney disease, stage 3 (moderate): Secondary | ICD-10-CM | POA: Diagnosis not present

## 2015-02-14 DIAGNOSIS — M1A0790 Idiopathic chronic gout, unspecified ankle and foot, without tophus (tophi): Secondary | ICD-10-CM | POA: Diagnosis not present

## 2015-03-20 DIAGNOSIS — K8020 Calculus of gallbladder without cholecystitis without obstruction: Secondary | ICD-10-CM | POA: Diagnosis not present

## 2015-03-20 DIAGNOSIS — E1165 Type 2 diabetes mellitus with hyperglycemia: Secondary | ICD-10-CM | POA: Diagnosis not present

## 2015-03-27 DIAGNOSIS — I10 Essential (primary) hypertension: Secondary | ICD-10-CM | POA: Diagnosis not present

## 2015-03-27 DIAGNOSIS — E785 Hyperlipidemia, unspecified: Secondary | ICD-10-CM | POA: Diagnosis not present

## 2015-03-27 DIAGNOSIS — K8050 Calculus of bile duct without cholangitis or cholecystitis without obstruction: Secondary | ICD-10-CM | POA: Diagnosis not present

## 2015-03-27 DIAGNOSIS — L820 Inflamed seborrheic keratosis: Secondary | ICD-10-CM | POA: Diagnosis not present

## 2015-03-27 DIAGNOSIS — E1165 Type 2 diabetes mellitus with hyperglycemia: Secondary | ICD-10-CM | POA: Diagnosis not present

## 2015-03-28 ENCOUNTER — Encounter: Payer: Medicare Other | Attending: Internal Medicine | Admitting: *Deleted

## 2015-03-28 ENCOUNTER — Encounter: Payer: Self-pay | Admitting: *Deleted

## 2015-03-28 VITALS — Ht 71.0 in | Wt 175.9 lb

## 2015-03-28 DIAGNOSIS — Z713 Dietary counseling and surveillance: Secondary | ICD-10-CM | POA: Insufficient documentation

## 2015-03-28 DIAGNOSIS — E119 Type 2 diabetes mellitus without complications: Secondary | ICD-10-CM | POA: Insufficient documentation

## 2015-03-28 NOTE — Patient Instructions (Signed)
Plan:  Aim for 3 Carb Choices per meal (45 grams) +/- 1 either way  Aim for 0-2 Carbs per snack if hungry  Include protein in moderation with your meals and snacks Consider reading food labels for Total Carbohydrate of foods Continue with your activity level daily as tolerated Consider the idea of checking BG  Continue taking Diabetes medication Metformin ER as directed by MD

## 2015-04-05 NOTE — Progress Notes (Signed)
Diabetes Self-Management Education  Visit Type: First/Initial  Appt. Start Time: 1030 Appt. End Time: 1200  04/05/2015  Mr. Jesse Hoffman, identified by name and date of birth, is a 79 y.o. male with a diagnosis of Diabetes: Type 2.   ASSESSMENT  Height 5\' 11"  (1.803 m), weight 175 lb 14.4 oz (79.788 kg). Body mass index is 24.54 kg/(m^2).      Diabetes Self-Management Education - 03/28/15 1030    Visit Information   Visit Type First/Initial   Initial Visit   Diabetes Type Type 2   Are you currently following a meal plan? No   Are you taking your medications as prescribed? Not on Medications  states he is not on Diabetes medications   Health Coping   How would you rate your overall health? Good   Psychosocial Assessment   Patient Belief/Attitude about Diabetes Motivated to manage diabetes   Other persons present Patient;Spouse/SO   Patient Concerns Nutrition/Meal planning   Special Needs None   Preferred Learning Style No preference indicated   Learning Readiness Ready   Complications   Last HgB A1C per patient/outside source 7.2 %   How often do you check your blood sugar? 0 times/day (not testing)   Have you had a dilated eye exam in the past 12 months? No   Have you had a dental exam in the past 12 months? Yes   Are you checking your feet? No   Dietary Intake   Breakfast Cheerios with Skim milk, tomato juice   Snack (morning) no   Lunch sandwich with yogurt OR veg salad with [place] dressing   Snack (afternoon) not unless peanuts   Dinner lean meat, brown rice, vegetables,    Snack (evening) no   Beverage(s) tomato juice, diet soda, wine before dinner 2-3 days a week, unsweet decaf iced tea with sweetener   Exercise   Exercise Type Light (walking / raking leaves)  [employer] with treadmill and weights   How many days per week to you exercise? 3.5   How many minutes per day do you exercise? 60   Total minutes per week of exercise 210   Patient Education   Previous  Diabetes Education No   Disease state  Definition of diabetes, type 1 and 2, and the diagnosis of diabetes   Nutrition management  Role of diet in the treatment of diabetes and the relationship between the three main macronutrients and blood glucose level;Food label reading, portion sizes and measuring food.;Carbohydrate counting   Physical activity and exercise  Role of exercise on diabetes management, blood pressure control and cardiac health.   Medications Reviewed patients medication for diabetes, action, purpose, timing of dose and side effects.   Monitoring Purpose and frequency of SMBG.;Identified appropriate SMBG and/or A1C goals.   Chronic complications Relationship between chronic complications and blood glucose control   Psychosocial adjustment Role of stress on diabetes   Individualized Goals (developed by patient)   Nutrition Follow meal plan discussed   Physical Activity Exercise 3-5 times per week   Medications take my medication as prescribed   Monitoring  Other (comment)  Consider talking to MD about BG monitoring   Outcomes   Expected Outcomes Demonstrated interest in learning. Expect positive outcomes   Future DMSE PRN   Program Status Completed      Individualized Plan for Diabetes Self-Management Training:   Learning Objective:  Patient will have a greater understanding of diabetes self-management. Patient education plan is to attend individual and/or group sessions  per assessed needs and concerns.   Plan:   Patient Instructions  Plan:  Aim for 3 Carb Choices per meal (45 grams) +/- 1 either way  Aim for 0-2 Carbs per snack if hungry  Include protein in moderation with your meals and snacks Consider reading food labels for Total Carbohydrate of foods Continue with your activity level daily as tolerated Consider the idea of checking BG  Continue taking Diabetes medication Metformin ER as directed by MD      Expected Outcomes:  Demonstrated interest in  learning. Expect positive outcomes  Education material provided: Living Well with Diabetes, A1C conversion sheet, Meal plan card and Carbohydrate counting sheet  If problems or questions, patient to contact team via:  Phone and Email  Future DSME appointment: PRN

## 2015-04-20 DIAGNOSIS — H524 Presbyopia: Secondary | ICD-10-CM | POA: Diagnosis not present

## 2015-04-20 DIAGNOSIS — H2513 Age-related nuclear cataract, bilateral: Secondary | ICD-10-CM | POA: Diagnosis not present

## 2015-04-20 DIAGNOSIS — H43813 Vitreous degeneration, bilateral: Secondary | ICD-10-CM | POA: Diagnosis not present

## 2015-04-20 DIAGNOSIS — E119 Type 2 diabetes mellitus without complications: Secondary | ICD-10-CM | POA: Diagnosis not present

## 2015-05-24 DIAGNOSIS — I10 Essential (primary) hypertension: Secondary | ICD-10-CM | POA: Diagnosis not present

## 2015-05-24 DIAGNOSIS — M1A0790 Idiopathic chronic gout, unspecified ankle and foot, without tophus (tophi): Secondary | ICD-10-CM | POA: Diagnosis not present

## 2015-05-24 DIAGNOSIS — Z0000 Encounter for general adult medical examination without abnormal findings: Secondary | ICD-10-CM | POA: Diagnosis not present

## 2015-05-24 DIAGNOSIS — E1165 Type 2 diabetes mellitus with hyperglycemia: Secondary | ICD-10-CM | POA: Diagnosis not present

## 2015-05-24 DIAGNOSIS — N183 Chronic kidney disease, stage 3 (moderate): Secondary | ICD-10-CM | POA: Diagnosis not present

## 2015-05-24 DIAGNOSIS — E782 Mixed hyperlipidemia: Secondary | ICD-10-CM | POA: Diagnosis not present

## 2015-06-07 DIAGNOSIS — E782 Mixed hyperlipidemia: Secondary | ICD-10-CM | POA: Diagnosis not present

## 2015-06-07 DIAGNOSIS — I10 Essential (primary) hypertension: Secondary | ICD-10-CM | POA: Diagnosis not present

## 2015-06-07 DIAGNOSIS — N183 Chronic kidney disease, stage 3 (moderate): Secondary | ICD-10-CM | POA: Diagnosis not present

## 2015-06-07 DIAGNOSIS — Z23 Encounter for immunization: Secondary | ICD-10-CM | POA: Diagnosis not present

## 2015-06-07 DIAGNOSIS — E1165 Type 2 diabetes mellitus with hyperglycemia: Secondary | ICD-10-CM | POA: Diagnosis not present

## 2015-09-13 ENCOUNTER — Encounter: Payer: Self-pay | Admitting: Gastroenterology

## 2015-10-10 ENCOUNTER — Encounter: Payer: Self-pay | Admitting: Gastroenterology

## 2015-11-21 ENCOUNTER — Ambulatory Visit (AMBULATORY_SURGERY_CENTER): Payer: Self-pay

## 2015-11-21 VITALS — Ht 71.0 in | Wt 169.0 lb

## 2015-11-21 DIAGNOSIS — Z860100 Personal history of colon polyps, unspecified: Secondary | ICD-10-CM

## 2015-11-21 MED ORDER — SUPREP BOWEL PREP KIT 17.5-3.13-1.6 GM/177ML PO SOLN: 1.0000 | Freq: Once | ORAL | Status: DC

## 2015-11-21 NOTE — Progress Notes (Signed)
No allergies to eggs or soy No past problems with anesthesia No home oxygen No diet meds  Has email and internet; declined emmi 

## 2015-11-22 ENCOUNTER — Encounter: Payer: Self-pay | Admitting: Gastroenterology

## 2015-12-03 DIAGNOSIS — I10 Essential (primary) hypertension: Secondary | ICD-10-CM | POA: Diagnosis not present

## 2015-12-03 DIAGNOSIS — N183 Chronic kidney disease, stage 3 (moderate): Secondary | ICD-10-CM | POA: Diagnosis not present

## 2015-12-03 DIAGNOSIS — E782 Mixed hyperlipidemia: Secondary | ICD-10-CM | POA: Diagnosis not present

## 2015-12-03 DIAGNOSIS — E1165 Type 2 diabetes mellitus with hyperglycemia: Secondary | ICD-10-CM | POA: Diagnosis not present

## 2015-12-03 DIAGNOSIS — M1A0790 Idiopathic chronic gout, unspecified ankle and foot, without tophus (tophi): Secondary | ICD-10-CM | POA: Diagnosis not present

## 2015-12-05 ENCOUNTER — Ambulatory Visit (AMBULATORY_SURGERY_CENTER): Payer: Medicare Other | Admitting: Gastroenterology

## 2015-12-05 ENCOUNTER — Encounter: Payer: Self-pay | Admitting: Gastroenterology

## 2015-12-05 VITALS — BP 111/57 | HR 59 | Temp 97.8°F | Resp 29 | Ht 71.0 in | Wt 169.0 lb

## 2015-12-05 DIAGNOSIS — Z86010 Personal history of colonic polyps: Secondary | ICD-10-CM | POA: Diagnosis not present

## 2015-12-05 DIAGNOSIS — D123 Benign neoplasm of transverse colon: Secondary | ICD-10-CM | POA: Diagnosis not present

## 2015-12-05 LAB — GLUCOSE, CAPILLARY
GLUCOSE-CAPILLARY: 84 mg/dL (ref 65–99)
Glucose-Capillary: 86 mg/dL (ref 65–99)

## 2015-12-05 MED ORDER — SODIUM CHLORIDE 0.9 % IV SOLN: 500.0000 mL | INTRAVENOUS | Status: DC

## 2015-12-05 NOTE — Op Note (Signed)
[facility] Patient Name: Jesse Hoffman Procedure Date: 12/05/2015 9:49 AM MRN: [id number] Endoscopist: Milus Banister , MD Age: 80 Referring MD:  Date of Birth: [birth year] Gender: Male Procedure:                Colonoscopy Indications:              High risk colon cancer surveillance: Personal                            history of colonic polyps Medicines:                Monitored Anesthesia Care Procedure:                Pre-Anesthesia Assessment:                           - Prior to the procedure, a History and Physical                            was performed, and patient medications and                            allergies were reviewed. The patient's tolerance of                            previous anesthesia was also reviewed. The risks                            and benefits of the procedure and the sedation                            options and risks were discussed with the patient.                            All questions were answered, and informed consent                            was obtained. Prior Anticoagulants: The patient has                            taken no previous anticoagulant or antiplatelet                            agents. ASA Grade Assessment: II - A patient with                            mild systemic disease. After reviewing the risks                            and benefits, the patient was deemed in                            satisfactory condition to undergo the procedure.  After obtaining informed consent, the colonoscope                            was passed under direct vision. Throughout the                            procedure, the patient's blood pressure, pulse, and                            oxygen saturations were monitored continuously. The                            Model CF-HQ190L [id number]) scope was introduced                            through the anus and advanced to the the cecum,                    identified by appendiceal orifice and ileocecal                            valve. The colonoscopy was performed without                            difficulty. The patient tolerated the procedure                            well. The quality of the bowel preparation was                            excellent. The ileocecal valve, appendiceal                            orifice, and rectum were photographed. Scope In: 10:02:50 AM Scope Out: 10:16:16 AM Scope Withdrawal Time: 0 hours 9 minutes 42 seconds  Total Procedure Duration: 0 hours 13 minutes 26 seconds  Findings:                 A 7 mm polyp was found in the transverse colon. The                            polyp was sessile. The polyp was removed with a                            cold snare. Resection and retrieval were complete.                           Multiple small and large-mouthed diverticula were                            found in the entire colon.                           The exam was otherwise without abnormality on  direct and retroflexion views. Complications:            No immediate complications. Estimated blood loss:                            None. Estimated Blood Loss:     Estimated blood loss: none. Impression:               - One 7 mm polyp in the transverse colon, removed                            with a cold snare. Resected and retrieved.                           - Diverticulosis in the entire examined colon.                           - The examination was otherwise normal on direct                            and retroflexion views. Recommendation:           - Patient has a contact number available for                            emergencies. The signs and symptoms of potential                            delayed complications were discussed with the                            patient. Return to normal activities tomorrow.                            Written discharge  instructions were provided to the                            patient.                           - Resume previous diet.                           - Continue present medications.                           - No repeat colonoscopy due to age. Colon cancer                            screening, polyp surveillence testing generall                            stops between ages 22-80. Milus Banister, MD 12/05/2015 10:23:15 AM This report has been signed electronically.

## 2015-12-05 NOTE — Patient Instructions (Signed)
YOU HAD AN ENDOSCOPIC PROCEDURE TODAY AT THE [facility]:   Refer to the procedure report that was given to you for any specific questions about what was found during the examination.  If the procedure report does not answer your questions, please call your gastroenterologist to clarify.  If you requested that your care partner not be given the details of your procedure findings, then the procedure report has been included in a sealed envelope for you to review at your convenience later.  YOU SHOULD EXPECT: Some feelings of bloating in the abdomen. Passage of more gas than usual.  Walking can help get rid of the air that was put into your GI tract during the procedure and reduce the bloating. If you had a lower endoscopy (such as a colonoscopy or flexible sigmoidoscopy) you may notice spotting of blood in your stool or on the toilet paper. If you underwent a bowel prep for your procedure, you may not have a normal bowel movement for a few days.  Please Note:  You might notice some irritation and congestion in your nose or some drainage.  This is from the oxygen used during your procedure.  There is no need for concern and it should clear up in a day or so.  SYMPTOMS TO REPORT IMMEDIATELY:   Following lower endoscopy (colonoscopy or flexible sigmoidoscopy):  Excessive amounts of blood in the stool  Significant tenderness or worsening of abdominal pains  Swelling of the abdomen that is new, acute  Fever of 100F or higher    For urgent or emergent issues, a gastroenterologist can be reached at any hour by calling [phone].   DIET: Your first meal following the procedure should be a small meal and then it is ok to progress to your normal diet. Heavy or fried foods are harder to digest and may make you feel nauseous or bloated.  Likewise, meals heavy in dairy and vegetables can increase bloating.  Drink plenty of fluids but you should avoid alcoholic beverages for 24  hours.  ACTIVITY:  You should plan to take it easy for the rest of today and you should NOT DRIVE or use heavy machinery until tomorrow (because of the sedation medicines used during the test).    FOLLOW UP: Our staff will call the number listed on your records the next business day following your procedure to check on you and address any questions or concerns that you may have regarding the information given to you following your procedure. If we do not reach you, we will leave a message.  However, if you are feeling well and you are not experiencing any problems, there is no need to return our call.  We will assume that you have returned to your regular daily activities without incident.  If any biopsies were taken you will be contacted by phone or by letter within the next 1-3 weeks.  Please call us at [phone] if you have not heard about the biopsies in 3 weeks.    SIGNATURES/CONFIDENTIALITY: You and/or your care partner have signed paperwork which will be entered into your electronic medical record.  These signatures attest to the fact that that the information above on your After Visit Summary has been reviewed and is understood.  Full responsibility of the confidentiality of this discharge information lies with you and/or your care-partner.    INFORMATION ON POLYPS,DIVERTICULOSIS ,AND HIGH FIBER DIET GIVEN TO YOU TODAY 

## 2015-12-05 NOTE — Progress Notes (Signed)
To pacu vss patent aw reprot to rn 

## 2015-12-05 NOTE — Progress Notes (Signed)
Called to room to assist during endoscopic procedure.  Patient ID and intended procedure confirmed with present staff. Received instructions for my participation in the procedure from the performing physician.  

## 2015-12-06 ENCOUNTER — Telehealth: Payer: Self-pay

## 2015-12-06 NOTE — Telephone Encounter (Signed)
  Follow up Call-  Call back number 12/05/2015  Post procedure Call Back phone  # [phone]  Permission to leave phone message Yes     Patient questions:  Do you have a fever, pain , or abdominal swelling? No. Pain Score  0 *  Have you tolerated food without any problems? Yes.    Have you been able to return to your normal activities? Yes.    Do you have any questions about your discharge instructions: Diet   No. Medications  No. Follow up visit  No.  Do you have questions or concerns about your Care? No.  Actions: * If pain score is 4 or above: No action needed, pain <4.

## 2015-12-10 DIAGNOSIS — E782 Mixed hyperlipidemia: Secondary | ICD-10-CM | POA: Diagnosis not present

## 2015-12-10 DIAGNOSIS — N183 Chronic kidney disease, stage 3 (moderate): Secondary | ICD-10-CM | POA: Diagnosis not present

## 2015-12-10 DIAGNOSIS — I10 Essential (primary) hypertension: Secondary | ICD-10-CM | POA: Diagnosis not present

## 2015-12-10 DIAGNOSIS — E1165 Type 2 diabetes mellitus with hyperglycemia: Secondary | ICD-10-CM | POA: Diagnosis not present

## 2015-12-11 ENCOUNTER — Encounter: Payer: Self-pay | Admitting: Gastroenterology

## 2016-04-24 DIAGNOSIS — H43813 Vitreous degeneration, bilateral: Secondary | ICD-10-CM | POA: Diagnosis not present

## 2016-04-24 DIAGNOSIS — E119 Type 2 diabetes mellitus without complications: Secondary | ICD-10-CM | POA: Diagnosis not present

## 2016-04-24 DIAGNOSIS — H2513 Age-related nuclear cataract, bilateral: Secondary | ICD-10-CM | POA: Diagnosis not present

## 2016-04-24 DIAGNOSIS — H524 Presbyopia: Secondary | ICD-10-CM | POA: Diagnosis not present

## 2016-06-04 DIAGNOSIS — Z23 Encounter for immunization: Secondary | ICD-10-CM | POA: Diagnosis not present

## 2016-06-30 DIAGNOSIS — Z0000 Encounter for general adult medical examination without abnormal findings: Secondary | ICD-10-CM | POA: Diagnosis not present

## 2016-06-30 DIAGNOSIS — E782 Mixed hyperlipidemia: Secondary | ICD-10-CM | POA: Diagnosis not present

## 2016-06-30 DIAGNOSIS — M109 Gout, unspecified: Secondary | ICD-10-CM | POA: Diagnosis not present

## 2016-06-30 DIAGNOSIS — E1165 Type 2 diabetes mellitus with hyperglycemia: Secondary | ICD-10-CM | POA: Diagnosis not present

## 2016-06-30 DIAGNOSIS — I10 Essential (primary) hypertension: Secondary | ICD-10-CM | POA: Diagnosis not present

## 2016-07-07 DIAGNOSIS — E1165 Type 2 diabetes mellitus with hyperglycemia: Secondary | ICD-10-CM | POA: Diagnosis not present

## 2016-07-07 DIAGNOSIS — I10 Essential (primary) hypertension: Secondary | ICD-10-CM | POA: Diagnosis not present

## 2016-07-07 DIAGNOSIS — E782 Mixed hyperlipidemia: Secondary | ICD-10-CM | POA: Diagnosis not present

## 2016-07-07 DIAGNOSIS — N183 Chronic kidney disease, stage 3 (moderate): Secondary | ICD-10-CM | POA: Diagnosis not present

## 2017-02-02 DIAGNOSIS — E1165 Type 2 diabetes mellitus with hyperglycemia: Secondary | ICD-10-CM | POA: Diagnosis not present

## 2017-02-02 DIAGNOSIS — M1A0790 Idiopathic chronic gout, unspecified ankle and foot, without tophus (tophi): Secondary | ICD-10-CM | POA: Diagnosis not present

## 2017-02-02 DIAGNOSIS — E782 Mixed hyperlipidemia: Secondary | ICD-10-CM | POA: Diagnosis not present

## 2017-02-06 ENCOUNTER — Emergency Department (HOSPITAL_COMMUNITY): Payer: Medicare Other

## 2017-02-06 ENCOUNTER — Emergency Department (HOSPITAL_COMMUNITY)
Admission: EM | Admit: 2017-02-06 | Discharge: 2017-02-06 | Disposition: A | Discharge status: Home / Self Care | Payer: Medicare Other | Attending: Emergency Medicine | Admitting: Emergency Medicine

## 2017-02-06 DIAGNOSIS — Y990 Civilian activity done for income or pay: Secondary | ICD-10-CM | POA: Diagnosis not present

## 2017-02-06 DIAGNOSIS — R55 Syncope and collapse: Secondary | ICD-10-CM | POA: Diagnosis not present

## 2017-02-06 DIAGNOSIS — R531 Weakness: Secondary | ICD-10-CM | POA: Diagnosis not present

## 2017-02-06 DIAGNOSIS — T675XXA Heat exhaustion, unspecified, initial encounter: Secondary | ICD-10-CM | POA: Insufficient documentation

## 2017-02-06 DIAGNOSIS — E860 Dehydration: Secondary | ICD-10-CM | POA: Diagnosis not present

## 2017-02-06 DIAGNOSIS — R404 Transient alteration of awareness: Secondary | ICD-10-CM | POA: Diagnosis not present

## 2017-02-06 DIAGNOSIS — R42 Dizziness and giddiness: Secondary | ICD-10-CM | POA: Diagnosis not present

## 2017-02-06 DIAGNOSIS — I959 Hypotension, unspecified: Secondary | ICD-10-CM | POA: Diagnosis present

## 2017-02-06 DIAGNOSIS — W1830XA Fall on same level, unspecified, initial encounter: Secondary | ICD-10-CM | POA: Diagnosis not present

## 2017-02-06 DIAGNOSIS — Y9289 Other specified places as the place of occurrence of the external cause: Secondary | ICD-10-CM | POA: Diagnosis not present

## 2017-02-06 DIAGNOSIS — Y9389 Activity, other specified: Secondary | ICD-10-CM | POA: Diagnosis not present

## 2017-02-06 LAB — CBC WITH DIFFERENTIAL/PLATELET
BASOS PCT: 0 %
Basophils Absolute: 0 10*3/uL (ref 0.0–0.1)
EOS ABS: 0.1 10*3/uL (ref 0.0–0.7)
Eosinophils Relative: 2 %
HEMATOCRIT: 33.2 % — AB (ref 39.0–52.0)
Hemoglobin: 11.6 g/dL — ABNORMAL LOW (ref 13.0–17.0)
Lymphocytes Relative: 15 %
Lymphs Abs: 1.2 10*3/uL (ref 0.7–4.0)
MCH: 31.4 pg (ref 26.0–34.0)
MCHC: 34.9 g/dL (ref 30.0–36.0)
MCV: 90 fL (ref 78.0–100.0)
MONO ABS: 0.3 10*3/uL (ref 0.1–1.0)
MONOS PCT: 4 %
NEUTROS ABS: 6.2 10*3/uL (ref 1.7–7.7)
Neutrophils Relative %: 79 %
Platelets: 147 10*3/uL — ABNORMAL LOW (ref 150–400)
RBC: 3.69 MIL/uL — ABNORMAL LOW (ref 4.22–5.81)
RDW: 12.5 % (ref 11.5–15.5)
WBC: 7.8 10*3/uL (ref 4.0–10.5)

## 2017-02-06 LAB — BASIC METABOLIC PANEL
Anion gap: 8 (ref 5–15)
BUN: 25 mg/dL — ABNORMAL HIGH (ref 6–20)
CALCIUM: 8.4 mg/dL — AB (ref 8.9–10.3)
CO2: 27 mmol/L (ref 22–32)
CREATININE: 1.65 mg/dL — AB (ref 0.61–1.24)
Chloride: 103 mmol/L (ref 101–111)
GFR calc non Af Amer: 38 mL/min — ABNORMAL LOW (ref >60)
GFR, EST AFRICAN AMERICAN: 44 mL/min — AB (ref >60)
Glucose, Bld: 120 mg/dL — ABNORMAL HIGH (ref 65–99)
Potassium: 4.2 mmol/L (ref 3.5–5.1)
Sodium: 138 mmol/L (ref 135–145)

## 2017-02-06 LAB — I-STAT TROPONIN, ED: Troponin i, poc: 0.01 ng/mL (ref 0.00–0.08)

## 2017-02-06 LAB — CBG MONITORING, ED: Glucose-Capillary: 121 mg/dL — ABNORMAL HIGH (ref 65–99)

## 2017-02-06 MED ORDER — SODIUM CHLORIDE 0.9 % IV BOLUS (SEPSIS)
1000.0000 mL | Freq: Once | INTRAVENOUS | Status: AC
Start: 2017-02-06 — End: 2017-02-06
  Administered 2017-02-06: 1000 mL via INTRAVENOUS

## 2017-02-06 NOTE — ED Notes (Signed)
Pt was not aware that a urine sample was needed, pt used the restroom but did not collect a sample.

## 2017-02-06 NOTE — ED Triage Notes (Signed)
Per EMS:  Pt coming from [facility] Pt was standing outside and got over heated and is currently having low BP.  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 Pt went to sit inside the [facility] of the ambulance and EMS could not get a BP. Pt has had 200 cc fluid CBG 198 Last vitals 99/60 BP HR 70 RR 12  O2 98% on RA  Family currently on the way

## 2017-02-06 NOTE — ED Provider Notes (Signed)
[facility] DEPT Provider Note   CSN: [id number] Arrival date & time: 02/06/17  1454     History   Chief Complaint Chief Complaint  Jesse Hoffman presents with  . Heat Exposure  . Hypotension    Jesse Hoffman is a 81 y.o. male.   Jesse Jesse Hoffman is a 81 year old male with a past history of essential hypertension, diabetes mellitus, CK D stage III presenting with a near syncopal episode approximately 1.5 hours ago while volunteering at a golf tournament. Jesse Hoffman reports he was out on the course standing for most of the afternoon and suddenly felt "woozy headed ".  Jesse Hoffman reports trying to sit down and missing the chair and sliding to the ground. This was a witnessed event Jesse Hoffman reports no loss of consciousness during this period. Jesse Hoffman reports no head trauma during this event. Jesse Hoffman reports feeling lightheaded Jesse Hoffman denies chest pain, and is not on any other antihyperglycemic's palpitations, shortness of breath, weakness, loss of sensation, headache, loss of vision, blurry vision, double vision, vertigo or aphasia. Jesse Hoffman reports that his blood pressure in the field taken at the tournament was around a systolic of 80. Jesse Hoffman reports drinking one diet Coke earlier this morning and one 16 ounce of water throughout the afternoon. Jesse Hoffman reports taking metformin in the evening and is not on any other antihyperglycemics. Jesse Hoffman has had an upper respiratory infection the last few days. Jesse Hoffman took antihypertensive earlier this morning. Jesse Hoffman does not have a history of arrhythmia and this is his first presyncopal episode.  Past Medical History:  Diagnosis Date  . Ascending cholangitis 2006, 01/2015   2016: in setting of choledocholithiasis.  s/p ERCP/sphinct/stone extraction  . Cholangitis 01/2015  . Cholecystitis, acute with cholelithiasis 2006  . CKD (chronic kidney disease), stage III   . Diabetes mellitus without complication ([place])   . GERD (gastroesophageal reflux disease)    . Gout July 2013  . 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   Marland Kitchen HLD (hyperlipidemia)   . Hypertension   . Thrombocytopenia ([place]) 01/2015   in setting of sepsis, cholangitis.   . Tubular adenoma of colon 09/2012   on colonoscopy diverticulosis and hemorrhoids noted as well    Jesse Hoffman Active Problem List   Diagnosis Date Noted  . Gall stones, common bile duct   . Abdominal pain 02/02/2015  . Acute renal failure superimposed on stage 3 chronic kidney disease ([place]) 02/02/2015  . Hypertension   . Diabetes mellitus without complication ([place])   . HLD (hyperlipidemia)   . GERD (gastroesophageal reflux disease)   . Epigastric pain   . Essential hypertension   . Elevated liver enzymes 01/19/2012  . Gout 12/22/2011    Past Surgical History:  Procedure Laterality Date  . ERCP N/A 02/03/2015   Procedure: ENDOSCOPIC RETROGRADE CHOLANGIOPANCREATOGRAPHY (ERCP);  Surgeon: Carol Ada, MD;  Location[facility];  Service: Endoscopy;  Laterality: N/A;  . ERCP W/ SPHICTEROTOMY  06/2004    Ampulla in a diverticulum with multiple stones, found in the  common bile duct  . fracture arm     age 45  . LAPAROSCOPIC CHOLECYSTECTOMY  06/2004   dr Zella Richer  . WISDOM TOOTH EXTRACTION         Home Medications    Prior to Admission medications   Medication Sig Start Date End Date Taking? Authorizing Provider  aspirin-sod bicarb-citric acid (ALKA-SELTZER) 325 MG TBEF tablet Take 325 mg by mouth every 6 (six) hours as needed (  cold).   Yes [provider]  atorvastatin (LIPITOR) 40 MG tablet Take 1 tablet (40 mg total) by mouth daily at 6 PM. Restart after 1 week, when Liver function tests normalize 02/06/15  Yes Domenic Polite, MD  ibuprofen (ADVIL,MOTRIN) 200 MG tablet Take 200 mg by mouth every 6 (six) hours as needed for mild pain.   Yes [provider]  losartan (COZAAR) 50 MG tablet Take 50 mg by mouth daily.   Yes [provider]    metFORMIN (GLUCOPHAGE-XR) 500 MG 24 hr tablet Take 500 mg by mouth daily.    Yes [provider]  triamterene-hydrochlorothiazide (MAXZIDE) 75-50 MG per tablet Take 1 tablet by mouth daily.   Yes [provider]    Family History Family History  Problem Relation Age of Onset  . Multiple sclerosis Sister   . Emphysema Father   . Heart attack Father   . Heart disease Mother   . Colon cancer Neg Hx     Social History Social History  Substance Use Topics  . Smoking status: Former Smoker    Types: Cigarettes    Quit date: 09/21/1963  . Smokeless tobacco: Never Used  . Alcohol use Yes     Comment: a glass of wine every 3 days     Allergies   Allopurinol   Review of Systems Review of Systems  Constitutional: Negative for activity change, appetite change, chills, diaphoresis, fatigue and fever.  HENT: Positive for congestion, rhinorrhea, sinus pain and sore throat. Negative for mouth sores.   Eyes: Negative for visual disturbance.  Respiratory: Positive for cough. Negative for chest tightness, shortness of breath and wheezing.   Cardiovascular: Negative for chest pain, palpitations and leg swelling.  Gastrointestinal: Negative for abdominal pain, blood in stool, diarrhea, nausea and vomiting.  Genitourinary: Negative for difficulty urinating and dysuria.  Musculoskeletal: Negative for arthralgias, back pain and myalgias.  Neurological: Negative for dizziness, syncope, weakness, light-headedness, numbness and headaches.  Hematological: Does not bruise/bleed easily.     Physical Exam Updated Vital Signs BP 130/79 (BP Location: Right Arm)   Pulse 65   Temp 98 F (36.7 C)   Resp 17   SpO2 100%   Physical Exam  Constitutional: He appears well-developed and well-nourished. No distress.  HENT:  Head: Normocephalic and atraumatic.  Mouth/Throat: Oropharynx is clear and moist.  Eyes: Pupils are equal, round, and reactive to light. Conjunctivae and EOM are  normal.  Neck: Normal range of motion. Neck supple.  Cardiovascular: Normal rate, regular rhythm, S1 normal and S2 normal.   Murmur heard. Early diastolic murmur.   Pulmonary/Chest: Effort normal and breath sounds normal. He has no wheezes. He has no rales.  Abdominal: Soft. He exhibits no distension. There is no tenderness. There is no guarding.  Musculoskeletal: Normal range of motion. He exhibits no edema or deformity.  Lymphadenopathy:    He has no cervical adenopathy.  Neurological: He is alert. He has normal strength. No cranial nerve deficit. He exhibits normal muscle tone.  CN s II-XII grossly intact.   Strength 5/5 upper and lower extremities.  Skin: Skin is warm and dry. No rash noted. No erythema.  Psychiatric: He has a normal mood and affect. His behavior is normal. Judgment and thought content normal.  Nursing note and vitals reviewed.    ED Treatments / Results  Labs (all labs ordered are listed, but only abnormal results are displayed) Labs Reviewed  CBC WITH DIFFERENTIAL/PLATELET - Abnormal; Notable for the following:  Result Value   RBC 3.69 (*)    Hemoglobin 11.6 (*)    HCT 33.2 (*)    Platelets 147 (*)    All other components within normal limits  BASIC METABOLIC PANEL - Abnormal; Notable for the following:    Glucose, Bld 120 (*)    BUN 25 (*)    Creatinine, Ser 1.65 (*)    Calcium 8.4 (*)    GFR calc non Af Amer 38 (*)    GFR calc Af Amer 44 (*)    All other components within normal limits  CBG MONITORING, ED - Abnormal; Notable for the following:    Glucose-Capillary 121 (*)    All other components within normal limits  URINALYSIS, ROUTINE W REFLEX MICROSCOPIC  I-STAT TROPONIN, ED    EKG  EKG Interpretation  Date/Time:  Friday February 06 2017 16:08:59 EDT Ventricular Rate:  66 PR Interval:    QRS Duration: 132 QT Interval:  411 QTC Calculation: 431 R Axis:   56 Text Interpretation:  Sinus rhythm Atrial premature complex Right bundle  branch block No STEMI. Similar to prior.  Confirmed by Nanda Quinton [phone]) on 02/06/2017 4:23:02 PM       Radiology Dg Chest 2 View  Result Date: 02/06/2017 CLINICAL DATA:  Dizziness and near syncope. EXAM: CHEST  2 VIEW COMPARISON:  02/01/2015 FINDINGS: Heart size is normal. There is aortic atherosclerosis. The pulmonary vascularity is normal. The lungs are clear. No effusions. No significant bone finding. IMPRESSION: No active disease.  Aortic atherosclerosis. Electronically Signed   By: Nelson Chimes M.D.   On: 02/06/2017 16:59    Procedures Procedures (including critical care time)  Medications Ordered in ED Medications  sodium chloride 0.9 % bolus 1,000 mL (0 mLs Intravenous Stopped 02/06/17 1809)     Initial Impression / Assessment and Plan / ED Course  I have reviewed the triage vital signs and the nursing notes.  Pertinent labs & imaging results that were available during my care of the Jesse Hoffman were reviewed by me and considered in my medical decision making (see chart for details).  Clinical Course as of Feb 06 1817  Fri Feb 06, 2017  1642 DG Chest 2 View [AM]  6045 DG Chest 2 View [AM]    Clinical Course User Index [AM] Albesa Seen, [place]   1700. Jesse Hoffman feeling much better aft 1L bolus. Orthostatic vital signs showing no evidence of orthostasis.  1746. Jesse Hoffman ambulated in the department. Jesse Hoffman showed no signs of orthostasis. Jesse Hoffman deemed stable for discharge.    Final Clinical Impressions(s) / ED Diagnoses   Final diagnoses:  Pre-syncope  Heat exhaustion, initial encounter  Dehydration   Jesse Hoffman is a 81 year old male with a past history of essential hypertension, diabetes mellitus, CKD stage III presenting with a near syncopal episode approximately 1.5 hours prior to presentation while volunteering at a golf tournament. Differential diagnosis includes dehydration, heat exhaustion, arrhythmia, MI, stroke, seizure. Jesse Hoffman's history is suspicious for  presyncope and dehydration given less than 32 ounces of fluid over the afternoon in 95 heat. Jesse Hoffman's labs supported diagnosis of dehydration given that BUN was elevated creatinine per Jesse Hoffman's baseline with CK D stage III. Jesse Hoffman's EKG showed no signs of ACS processes or arrhythmia and is consistent with prior EKG. Jesse Hoffman's telemetry reading in the department shows no ectopy or concerning findings for arrhythmia. On exam Jesse Hoffman had a normal cardiopulmonary exam and showed no focal neurologic deficits. Jesse Hoffman ambulated around the department without difficulty and show no  evidence of orthostasis. Jesse Hoffman's orthostatic vital signs prior to liter bolus of fluid showed no evidence of orthostasis.   Jesse Hoffman deemed stable for discharge and Jesse Hoffman and his wife were fully aware of return precautions and have a prescheduled follow-up with Jesse Hoffman's primary care physician on Monday. Jesse Hoffman counseled to return for any new presyncope episodes chest pain, shortness of breath, or palpitations.   New Prescriptions New Prescriptions   No medications on file     Tamala Julian 02/06/17 1818    Jola Schmidt, MD 02/06/17 [phone]

## 2017-02-06 NOTE — ED Notes (Signed)
Bed: LH73 Expected date:  Expected time:  Means of arrival:  Comments: 81 yo heat exhaustion

## 2017-02-06 NOTE — Discharge Instructions (Signed)
Today you was seen in the ED for pre-syncope, dehydration, heat exhaustion. Tests performed today include:  -Chest x-ray  -Blood counts -Kidney function and electrolytes -Chest x-ray -Troponin, a marker of heart damage.  Results of this testing was normal. Please refrain from standing for long periods of time and that heat. Keep water with you at all times to stay hydrated. Return for evaluation if you experience chest pain shortness of breath rapid or abnormal heart rate or loss of consciousness. As you recover today, please be cautious going from a sitting to a standing position to avoid sudden drops in blood pressure. Follow up with your primary care physician on Monday.  Thank you for allowing [place] to participate in your care today.

## 2017-02-09 DIAGNOSIS — N183 Chronic kidney disease, stage 3 (moderate): Secondary | ICD-10-CM | POA: Diagnosis not present

## 2017-02-09 DIAGNOSIS — M1A0790 Idiopathic chronic gout, unspecified ankle and foot, without tophus (tophi): Secondary | ICD-10-CM | POA: Diagnosis not present

## 2017-02-09 DIAGNOSIS — I129 Hypertensive chronic kidney disease with stage 1 through stage 4 chronic kidney disease, or unspecified chronic kidney disease: Secondary | ICD-10-CM | POA: Diagnosis not present

## 2017-02-09 DIAGNOSIS — E782 Mixed hyperlipidemia: Secondary | ICD-10-CM | POA: Diagnosis not present

## 2017-02-16 DIAGNOSIS — E782 Mixed hyperlipidemia: Secondary | ICD-10-CM | POA: Diagnosis not present

## 2017-02-16 DIAGNOSIS — E1165 Type 2 diabetes mellitus with hyperglycemia: Secondary | ICD-10-CM | POA: Diagnosis not present

## 2017-04-01 DIAGNOSIS — D225 Melanocytic nevi of trunk: Secondary | ICD-10-CM | POA: Diagnosis not present

## 2017-04-01 DIAGNOSIS — L820 Inflamed seborrheic keratosis: Secondary | ICD-10-CM | POA: Diagnosis not present

## 2017-04-01 DIAGNOSIS — D1801 Hemangioma of skin and subcutaneous tissue: Secondary | ICD-10-CM | POA: Diagnosis not present

## 2017-04-01 DIAGNOSIS — L570 Actinic keratosis: Secondary | ICD-10-CM | POA: Diagnosis not present

## 2017-04-01 DIAGNOSIS — L821 Other seborrheic keratosis: Secondary | ICD-10-CM | POA: Diagnosis not present

## 2017-04-27 DIAGNOSIS — H524 Presbyopia: Secondary | ICD-10-CM | POA: Diagnosis not present

## 2017-04-27 DIAGNOSIS — E119 Type 2 diabetes mellitus without complications: Secondary | ICD-10-CM | POA: Diagnosis not present

## 2017-04-27 DIAGNOSIS — H43813 Vitreous degeneration, bilateral: Secondary | ICD-10-CM | POA: Diagnosis not present

## 2017-04-27 DIAGNOSIS — H25813 Combined forms of age-related cataract, bilateral: Secondary | ICD-10-CM | POA: Diagnosis not present

## 2017-05-08 ENCOUNTER — Encounter (HOSPITAL_COMMUNITY): Payer: Self-pay | Admitting: Obstetrics and Gynecology

## 2017-05-08 ENCOUNTER — Emergency Department (HOSPITAL_COMMUNITY)
Admission: EM | Admit: 2017-05-08 | Discharge: 2017-05-08 | Disposition: A | Discharge status: Home / Self Care | Payer: Medicare Other | Attending: Emergency Medicine | Admitting: Emergency Medicine

## 2017-05-08 ENCOUNTER — Emergency Department (HOSPITAL_COMMUNITY): Payer: Medicare Other

## 2017-05-08 DIAGNOSIS — N183 Chronic kidney disease, stage 3 (moderate): Secondary | ICD-10-CM | POA: Insufficient documentation

## 2017-05-08 DIAGNOSIS — E119 Type 2 diabetes mellitus without complications: Secondary | ICD-10-CM | POA: Diagnosis not present

## 2017-05-08 DIAGNOSIS — R109 Unspecified abdominal pain: Secondary | ICD-10-CM | POA: Diagnosis not present

## 2017-05-08 DIAGNOSIS — R740 Nonspecific elevation of levels of transaminase and lactic acid dehydrogenase [LDH]: Secondary | ICD-10-CM | POA: Insufficient documentation

## 2017-05-08 DIAGNOSIS — R945 Abnormal results of liver function studies: Secondary | ICD-10-CM | POA: Diagnosis not present

## 2017-05-08 DIAGNOSIS — Z87891 Personal history of nicotine dependence: Secondary | ICD-10-CM | POA: Diagnosis not present

## 2017-05-08 DIAGNOSIS — N289 Disorder of kidney and ureter, unspecified: Secondary | ICD-10-CM | POA: Diagnosis not present

## 2017-05-08 DIAGNOSIS — K7200 Acute and subacute hepatic failure without coma: Secondary | ICD-10-CM | POA: Diagnosis not present

## 2017-05-08 DIAGNOSIS — Z79899 Other long term (current) drug therapy: Secondary | ICD-10-CM | POA: Insufficient documentation

## 2017-05-08 DIAGNOSIS — R17 Unspecified jaundice: Secondary | ICD-10-CM | POA: Diagnosis not present

## 2017-05-08 DIAGNOSIS — L299 Pruritus, unspecified: Secondary | ICD-10-CM | POA: Diagnosis not present

## 2017-05-08 DIAGNOSIS — R7401 Elevation of levels of liver transaminase levels: Secondary | ICD-10-CM

## 2017-05-08 DIAGNOSIS — I129 Hypertensive chronic kidney disease with stage 1 through stage 4 chronic kidney disease, or unspecified chronic kidney disease: Secondary | ICD-10-CM | POA: Insufficient documentation

## 2017-05-08 DIAGNOSIS — R21 Rash and other nonspecific skin eruption: Secondary | ICD-10-CM | POA: Diagnosis not present

## 2017-05-08 DIAGNOSIS — K1370 Unspecified lesions of oral mucosa: Secondary | ICD-10-CM | POA: Diagnosis not present

## 2017-05-08 DIAGNOSIS — T7840XA Allergy, unspecified, initial encounter: Secondary | ICD-10-CM | POA: Diagnosis not present

## 2017-05-08 LAB — COMPREHENSIVE METABOLIC PANEL
ALBUMIN: 3.5 g/dL (ref 3.5–5.0)
ALK PHOS: 337 U/L — AB (ref 38–126)
ALT: 223 U/L — AB (ref 17–63)
ANION GAP: 9 (ref 5–15)
AST: 151 U/L — ABNORMAL HIGH (ref 15–41)
BUN: 29 mg/dL — AB (ref 6–20)
CALCIUM: 8.8 mg/dL — AB (ref 8.9–10.3)
CO2: 23 mmol/L (ref 22–32)
Chloride: 100 mmol/L — ABNORMAL LOW (ref 101–111)
Creatinine, Ser: 2 mg/dL — ABNORMAL HIGH (ref 0.61–1.24)
GFR calc Af Amer: 35 mL/min — ABNORMAL LOW (ref >60)
GFR, EST NON AFRICAN AMERICAN: 30 mL/min — AB (ref >60)
Glucose, Bld: 179 mg/dL — ABNORMAL HIGH (ref 65–99)
POTASSIUM: 4.2 mmol/L (ref 3.5–5.1)
SODIUM: 132 mmol/L — AB (ref 135–145)
TOTAL PROTEIN: 7.1 g/dL (ref 6.5–8.1)
Total Bilirubin: 2 mg/dL — ABNORMAL HIGH (ref 0.3–1.2)

## 2017-05-08 LAB — CBC WITH DIFFERENTIAL/PLATELET
BASOS ABS: 0 10*3/uL (ref 0.0–0.1)
BASOS PCT: 0 %
EOS PCT: 1 %
Eosinophils Absolute: 0.1 10*3/uL (ref 0.0–0.7)
HCT: 34.5 % — ABNORMAL LOW (ref 39.0–52.0)
Hemoglobin: 11.8 g/dL — ABNORMAL LOW (ref 13.0–17.0)
LYMPHS PCT: 16 %
Lymphs Abs: 1.3 10*3/uL (ref 0.7–4.0)
MCH: 31.1 pg (ref 26.0–34.0)
MCHC: 34.2 g/dL (ref 30.0–36.0)
MCV: 91 fL (ref 78.0–100.0)
Monocytes Absolute: 0.4 10*3/uL (ref 0.1–1.0)
Monocytes Relative: 5 %
Neutro Abs: 5.9 10*3/uL (ref 1.7–7.7)
Neutrophils Relative %: 78 %
PLATELETS: 179 10*3/uL (ref 150–400)
RBC: 3.79 MIL/uL — AB (ref 4.22–5.81)
RDW: 12.8 % (ref 11.5–15.5)
WBC: 7.6 10*3/uL (ref 4.0–10.5)

## 2017-05-08 LAB — URINALYSIS, ROUTINE W REFLEX MICROSCOPIC
BILIRUBIN URINE: NEGATIVE
Glucose, UA: NEGATIVE mg/dL
Hgb urine dipstick: NEGATIVE
KETONES UR: NEGATIVE mg/dL
LEUKOCYTES UA: NEGATIVE
NITRITE: NEGATIVE
Protein, ur: NEGATIVE mg/dL
Specific Gravity, Urine: 1.005 (ref 1.005–1.030)
pH: 7 (ref 5.0–8.0)

## 2017-05-08 LAB — LIPASE, BLOOD: LIPASE: 54 U/L — AB (ref 11–51)

## 2017-05-08 MED ORDER — IOPAMIDOL (ISOVUE-300) INJECTION 61%
30.0000 mL | Freq: Once | INTRAVENOUS | Status: AC | PRN
  Administered 2017-05-08: 30 mL via ORAL

## 2017-05-08 MED ORDER — IOPAMIDOL (ISOVUE-300) INJECTION 61%
INTRAVENOUS | Status: AC
  Filled 2017-05-08: qty 30

## 2017-05-08 MED ORDER — HYDROXYZINE HCL 25 MG PO TABS
25.0000 mg | ORAL_TABLET | Freq: Once | ORAL | Status: AC
  Administered 2017-05-08: 25 mg via ORAL
  Filled 2017-05-08: qty 1

## 2017-05-08 MED ORDER — LORAZEPAM 1 MG PO TABS: 1.0000 mg | ORAL_TABLET | Freq: Four times a day (QID) | ORAL | 0 refills | Status: DC | PRN

## 2017-05-08 MED ORDER — HYDROXYZINE HCL 25 MG PO TABS: 25.0000 mg | ORAL_TABLET | Freq: Four times a day (QID) | ORAL | 0 refills | Status: DC | PRN

## 2017-05-08 NOTE — ED Notes (Signed)
ED Provider at bedside. 

## 2017-05-08 NOTE — ED Notes (Signed)
Pt ambulatory.

## 2017-05-08 NOTE — ED Notes (Signed)
Bed: ZH46 Expected date:  Expected time:  Means of arrival:  Comments: EMS abnormal liver enzymes

## 2017-05-08 NOTE — Discharge Instructions (Signed)
You were seen in the ED today with itching. I think this is likely from your elevated liver enzymes. You will need to call your PCP and gastroenterologist next week. You may need an ultrasound as an outpatient.   Return to the ED with any abdominal pain, fever, chills, chest pain, or difficulty breathing.

## 2017-05-08 NOTE — ED Notes (Signed)
PT made aware of urine sample 

## 2017-05-08 NOTE — ED Provider Notes (Signed)
Emergency Department Provider Note   I have reviewed the triage vital signs and the nursing notes.   HISTORY  Chief Complaint Abnormal Labs and Pruritis   HPI Jesse Hoffman is a 81 y.o. male with PMH of CKD, GERD, DM, and HTN resents to the emergency department for evaluation of total body itching over the last week.  He denies any abdominal pain, nausea, vomiting.  No fevers or flulike symptoms. Patient went to the PCP this week and was called today with lab results showing that his liver enzymes were elevated.  Patient states he has had false positive hepatitis test in the past but is followed up with a specialist and was told he does not have this.  He is s/p cholecystectomy several years ago.   Past Medical History:  Diagnosis Date  . Ascending cholangitis 2006, 01/2015   2016: in setting of choledocholithiasis.  s/p ERCP/sphinct/stone extraction  . Cholangitis 01/2015  . Cholecystitis, acute with cholelithiasis 2006  . CKD (chronic kidney disease), stage III ([place])   . Diabetes mellitus without complication ([place])   . GERD (gastroesophageal reflux disease)   . Gout July 2013  . 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.   Marland Kitchen HLD (hyperlipidemia)   . Hypertension   . Thrombocytopenia ([place]) 01/2015   in setting of sepsis, cholangitis.   . Tubular adenoma of colon 09/2012   on colonoscopy diverticulosis and hemorrhoids noted as well    Patient Active Problem List   Diagnosis Date Noted  . Gall stones, common bile duct   . Abdominal pain 02/02/2015  . Acute renal failure superimposed on stage 3 chronic kidney disease ([place]) 02/02/2015  . Hypertension   . Diabetes mellitus without complication ([place])   . HLD (hyperlipidemia)   . GERD (gastroesophageal reflux disease)   . Epigastric pain   . Essential hypertension   . Elevated liver enzymes 01/19/2012  . Gout 12/22/2011    Past Surgical History:  Procedure Laterality Date  .  ENDOSCOPIC RETROGRADE CHOLANGIOPANCREATOGRAPHY (ERCP) N/A 02/03/2015   Performed by Carol Ada, MD at [facility]  . ERCP W/ SPHICTEROTOMY  06/2004    Ampulla in a diverticulum with multiple stones, found in the  common bile duct  . fracture arm     age 71  . LAPAROSCOPIC CHOLECYSTECTOMY  06/2004   dr Zella Richer  . WISDOM TOOTH EXTRACTION      Current Outpatient Rx  . Order #: [id number] Class: No Print  . Order #: [id number] Class: Historical Med  . Order #: [id number] Class: Historical Med  . Order #: [id number] Class: Historical Med  . Order #: [id number] Class: Historical Med  . Order #: [id number] Class: Historical Med  . Order #: [id number] Class: Print  . Order #: [id number] Class: Print    Allergies Allopurinol  Family History  Problem Relation Age of Onset  . Multiple sclerosis Sister   . Emphysema Father   . Heart attack Father   . Heart disease Mother   . Colon cancer Neg Hx     Social History Social History   Tobacco Use  . Smoking status: Former Smoker    Types: Cigarettes    Last attempt to quit: 09/21/1963    Years since quitting: 53.6  . Smokeless tobacco: Never Used  Substance Use Topics  . Alcohol use: Yes    Comment: a glass of wine every 3 days  . Drug use: No    Review of Systems  Constitutional: No  fever/chills. Positive total body itching.  Eyes: No visual changes. ENT: No sore throat. Cardiovascular: Denies chest pain. Respiratory: Denies shortness of breath. Gastrointestinal: No abdominal pain.  No nausea, no vomiting.  No diarrhea.  No constipation. Genitourinary: Negative for dysuria. Musculoskeletal: Negative for back pain. Skin: Negative for rash. Neurological: Negative for headaches, focal weakness or numbness.  10-point ROS otherwise negative.  ____________________________________________   PHYSICAL EXAM:  VITAL SIGNS: ED Triage Vitals [05/08/17 1127]  Enc Vitals Group     BP 127/74     Pulse Rate 80     Resp 16     Temp (!) 97.5  F (36.4 C)     Temp Source Oral     SpO2 98 %   Constitutional: Alert and oriented. Well appearing and in no acute distress. Patient is itching frequently.  Eyes: Conjunctivae are normal.  Head: Atraumatic. Nose: No congestion/rhinnorhea. Mouth/Throat: Mucous membranes are moist.  Neck: No stridor.   Cardiovascular: Normal rate, regular rhythm. Good peripheral circulation. Grossly normal heart sounds.   Respiratory: Normal respiratory effort.  No retractions. Lungs CTAB. Gastrointestinal: Soft and nontender. No distention.  Musculoskeletal: No lower extremity tenderness nor edema. No gross deformities of extremities. Neurologic:  Normal speech and language. No gross focal neurologic deficits are appreciated.  Skin:  Skin is warm, dry and intact. No rash noted.  ____________________________________________   LABS (all labs ordered are listed, but only abnormal results are displayed)  Labs Reviewed  COMPREHENSIVE METABOLIC PANEL - Abnormal; Notable for the following components:      Result Value   Sodium 132 (*)    Chloride 100 (*)    Glucose, Bld 179 (*)    BUN 29 (*)    Creatinine, Ser 2.00 (*)    Calcium 8.8 (*)    AST 151 (*)    ALT 223 (*)    Alkaline Phosphatase 337 (*)    Total Bilirubin 2.0 (*)    GFR calc non Af Amer 30 (*)    GFR calc Af Amer 35 (*)    All other components within normal limits  CBC WITH DIFFERENTIAL/PLATELET - Abnormal; Notable for the following components:   RBC 3.79 (*)    Hemoglobin 11.8 (*)    HCT 34.5 (*)    All other components within normal limits  LIPASE, BLOOD - Abnormal; Notable for the following components:   Lipase 54 (*)    All other components within normal limits  URINALYSIS, ROUTINE W REFLEX MICROSCOPIC - Abnormal; Notable for the following components:   Color, Urine STRAW (*)    All other components within normal limits   ____________________________________________  RADIOLOGY  Ct Abdomen Pelvis Wo Contrast  Result  Date: 05/08/2017 CLINICAL DATA:  81 year old with elevated serum liver function tests and generalized itching. Surgical history includes cholecystectomy. Acute superimposed upon chronic renal failure (creatinine 2.00, estimated GFR 30) which precludes IV contrast. EXAM: CT ABDOMEN AND PELVIS WITHOUT CONTRAST TECHNIQUE: Multidetector CT imaging of the abdomen and pelvis was performed following the standard protocol without IV contrast. Oral contrast was administered. COMPARISON:  CT abdomen pelvis 02/04/2015, 02/01/2015 and earlier. MRI abdomen 02/02/2015. FINDINGS: Lower chest: Minimal scarring in the right middle lobe, lingula and lower lobes. Visualized lung bases otherwise clear. Heart upper normal in size. Aortic valvular calcifications as noted previously. Hepatobiliary: Normal unenhanced appearance of the liver. Surgically absent gallbladder. Extrahepatic biliary ductal dilation up to 16 mm, not significantly changed since the prior CT from 2016. No visible common bile duct  stones. See below comments regarding duodenal diverticulum. Pancreas: Cystic mass containing a solitary calcification involving the head of the pancreas, measuring approximately 1.4 x 1.2 x 1.1 cm (series 2, image 25 and coronal image 35), slightly increased in size since 2016. No evidence of pancreatic ductal dilation. No masses elsewhere involving the pancreas. No peripancreatic inflammation. Spleen: Normal unenhanced appearance. Adrenals/Urinary Tract: Normal appearing adrenal glands. Multiple benign cortical cysts involving both kidneys, the largest measuring approximately 4 cm arising from the upper pole the right kidney. Likely benign exophytic complex cyst arising from the lower pole of the left kidney anteriorly measuring approximately 1.0 x 1.2 x 1.2 cm, not reliably demonstrated on the prior CT or and not imaged on the prior MRI. No hydronephrosis. No urinary tract calculi on either side. Decompressed urinary bladder with  diffusely thickened bladder wall. Stomach/Bowel: Stomach relatively decompressed and unremarkable. Diverticulum arising from the second portion the duodenum, and the common bile duct may enter the diverticulum or enters the duodenum just distal to the diverticulum. Small bowel otherwise normal in appearance. Diffuse colonic diverticulosis, most extensive in the sigmoid colon, without evidence of acute diverticulitis. Moderate colonic stool burden. Normal retrocecal appendix in the right upper pelvis filling with oral contrast. Vascular/Lymphatic: Severe aortoiliofemoral atherosclerosis without evidence of aneurysm. No pathologic lymphadenopathy. Reproductive: Moderate prostate gland enlargement, including the median lobe. Normal seminal vesicles. Other: None. Musculoskeletal: Degenerative disc disease and spondylosis at L2-3. DISH involving the lower thoracic spine. Facet degenerative changes at L2-3, L4-5 and L5-S1. Degenerative changes involving the sacroiliac joints. Mild degenerative changes involving both hips. No acute osseous findings. IMPRESSION: 1. No acute abnormalities involving the abdomen or pelvis. 2. Normal unenhanced appearance of the liver. If the liver function tests remain abnormal, non-emergent right upper quadrant abdominal ultrasound and hepatic elastography may be helpful in further evaluation. 3. 1.2 cm cystic mass involving the head of the pancreas, minimally increased in size since 2016. Given the fact the mass is still less than 1.5 cm, follow-up CT abdomen in 1 year is recommended. This recommendation follows ACR consensus guidelines: Management of Incidental Pancreatic Cysts: A White Paper of the ACR Incidental Findings Committee. J Am Coll Radiol 8469;62:952-841. 4. Numerous benign cortical cysts involving both kidneys. Likely benign hemorrhagic or proteinaceous 1.2 cm cyst arising from the lower pole of the left kidney. As this lesion was not visible on the prior CT from 2016,  follow-up abdominal CT in 3 months is recommended to confirm stability. 5. Diverticulum arising from the medial aspect of the duodenum. It appears that the common bile duct may enter the diverticulum or enters just distal to the diverticulum. 6. Diffuse colonic diverticulosis without evidence of acute diverticulitis. 7. Prostate gland enlargement, particularly the median lobe. 8. Generalized bladder wall thickening. This is likely due to chronic outlet obstruction related to the prostatomegaly, the can be seen in patients with cystitis. 9. Extrahepatic biliary ductal dilation up to 16 mm, unchanged since 2016, without evidence of obstructing stone or mass. Aortic Atherosclerosis (ICD10-I70.0). Electronically Signed   By: Evangeline Dakin M.D.   On: 05/08/2017 16:06    ____________________________________________   PROCEDURES  Procedure(s) performed:   Procedures  None  ____________________________________________   INITIAL IMPRESSION / ASSESSMENT AND PLAN / ED COURSE  Pertinent labs & imaging results that were available during my care of the patient were reviewed by me and considered in my medical decision making (see chart for details).  Patient presents to the emergency department for evaluation of total body  itching and elevated liver enzymes.  He denies fevers, abdominal pain, nausea, vomiting.  He is followed in the past with gastroenterology regarding his elevated liver enzymes and was found to have tested false positive for hepatitis B. No other reason found for the lab abnormalities at that time.  He has had a cholecystectomy in the past.  Labs today show mild elevation in liver enzymes along with mild elevation in bilirubin which I suspect is causing his symptoms.  Plan for CT imaging of the abdomen to assess for pancreatic/biliary mass. Will treat with Atarax.   04:30 PM CT imaging reviewed and discussed with the patient.  He had some relief with Atarax. Plan to add Ativan to assist  with sleep. Plan to follow up with PCP and GI as an outpatient for [place] and repeat hepatitis labs. No rash, SOB, or evidence of acute allergic reaction. Plan for symptom treatment and GI follow up.   At this time, I do not feel there is any life-threatening condition present. I have reviewed and discussed all results (EKG, imaging, lab, urine as appropriate), exam findings with patient. I have reviewed nursing notes and appropriate previous records.  I feel the patient is safe to be discharged home without further emergent workup. Discussed usual and customary return precautions. Patient and family (if present) verbalize understanding and are comfortable with this plan.  Patient will follow-up with their primary care provider. If they do not have a primary care provider, information for follow-up has been provided to them. All questions have been answered.  ____________________________________________  FINAL CLINICAL IMPRESSION(S) / ED DIAGNOSES  Final diagnoses:  Itching  Transaminitis     MEDICATIONS GIVEN DURING THIS VISIT:  Medications  iopamidol (ISOVUE-300) 61 % injection (not administered)  hydrOXYzine (ATARAX/VISTARIL) tablet 25 mg (25 mg Oral Given 05/08/17 1340)  iopamidol (ISOVUE-300) 61 % injection 30 mL (30 mLs Oral Contrast Given 05/08/17 1411)     NEW OUTPATIENT MEDICATIONS STARTED DURING THIS VISIT:  Atarax and Ativan   Note:  This document was prepared using Dragon voice recognition software and may include unintentional dictation errors.  Nanda Quinton, MD Emergency Medicine    Long, Wonda Olds, MD 05/08/17 [phone]

## 2017-05-08 NOTE — ED Triage Notes (Signed)
Per EMS:  Pt is coming from Pt went to the doctor today because he is itching all over. Within the blood work they found AST 187 and ALT 302. Pt has had no medication changes.   Pt's vitals WNL. Lab levels in room from doctor's office

## 2017-05-11 DIAGNOSIS — R945 Abnormal results of liver function studies: Secondary | ICD-10-CM | POA: Diagnosis not present

## 2017-05-11 DIAGNOSIS — R21 Rash and other nonspecific skin eruption: Secondary | ICD-10-CM | POA: Diagnosis not present

## 2017-05-11 DIAGNOSIS — N289 Disorder of kidney and ureter, unspecified: Secondary | ICD-10-CM | POA: Diagnosis not present

## 2017-05-11 DIAGNOSIS — R17 Unspecified jaundice: Secondary | ICD-10-CM | POA: Diagnosis not present

## 2017-05-13 ENCOUNTER — Inpatient Hospital Stay (HOSPITAL_COMMUNITY): Payer: Medicare Other

## 2017-05-13 ENCOUNTER — Telehealth: Payer: Self-pay | Admitting: Nurse Practitioner

## 2017-05-13 ENCOUNTER — Ambulatory Visit: Payer: Medicare Other | Admitting: Nurse Practitioner

## 2017-05-13 ENCOUNTER — Encounter (HOSPITAL_COMMUNITY): Payer: Self-pay | Admitting: Emergency Medicine

## 2017-05-13 ENCOUNTER — Inpatient Hospital Stay (HOSPITAL_COMMUNITY)
Admission: EM | Admit: 2017-05-13 | Discharge: 2017-05-17 | DRG: 445 | Disposition: A | Discharge status: Home / Self Care | Payer: Medicare Other | Attending: Family Medicine | Admitting: Family Medicine

## 2017-05-13 ENCOUNTER — Encounter: Payer: Self-pay | Admitting: Nurse Practitioner

## 2017-05-13 ENCOUNTER — Other Ambulatory Visit: Payer: Self-pay

## 2017-05-13 VITALS — BP 102/64 | HR 76 | Ht 71.0 in | Wt 173.6 lb

## 2017-05-13 DIAGNOSIS — R748 Abnormal levels of other serum enzymes: Secondary | ICD-10-CM | POA: Diagnosis present

## 2017-05-13 DIAGNOSIS — K8031 Calculus of bile duct with cholangitis, unspecified, with obstruction: Secondary | ICD-10-CM | POA: Diagnosis not present

## 2017-05-13 DIAGNOSIS — I129 Hypertensive chronic kidney disease with stage 1 through stage 4 chronic kidney disease, or unspecified chronic kidney disease: Secondary | ICD-10-CM | POA: Diagnosis not present

## 2017-05-13 DIAGNOSIS — Z7984 Long term (current) use of oral hypoglycemic drugs: Secondary | ICD-10-CM

## 2017-05-13 DIAGNOSIS — B961 Klebsiella pneumoniae [K. pneumoniae] as the cause of diseases classified elsewhere: Secondary | ICD-10-CM | POA: Diagnosis present

## 2017-05-13 DIAGNOSIS — Z87891 Personal history of nicotine dependence: Secondary | ICD-10-CM

## 2017-05-13 DIAGNOSIS — K8050 Calculus of bile duct without cholangitis or cholecystitis without obstruction: Secondary | ICD-10-CM

## 2017-05-13 DIAGNOSIS — R1011 Right upper quadrant pain: Secondary | ICD-10-CM | POA: Diagnosis not present

## 2017-05-13 DIAGNOSIS — E1022 Type 1 diabetes mellitus with diabetic chronic kidney disease: Secondary | ICD-10-CM | POA: Diagnosis not present

## 2017-05-13 DIAGNOSIS — J9811 Atelectasis: Secondary | ICD-10-CM | POA: Diagnosis not present

## 2017-05-13 DIAGNOSIS — R7989 Other specified abnormal findings of blood chemistry: Secondary | ICD-10-CM

## 2017-05-13 DIAGNOSIS — E119 Type 2 diabetes mellitus without complications: Secondary | ICD-10-CM | POA: Diagnosis not present

## 2017-05-13 DIAGNOSIS — K219 Gastro-esophageal reflux disease without esophagitis: Secondary | ICD-10-CM | POA: Diagnosis present

## 2017-05-13 DIAGNOSIS — N183 Chronic kidney disease, stage 3 unspecified: Secondary | ICD-10-CM | POA: Diagnosis present

## 2017-05-13 DIAGNOSIS — Z9049 Acquired absence of other specified parts of digestive tract: Secondary | ICD-10-CM | POA: Diagnosis not present

## 2017-05-13 DIAGNOSIS — K862 Cyst of pancreas: Secondary | ICD-10-CM | POA: Diagnosis present

## 2017-05-13 DIAGNOSIS — Z888 Allergy status to other drugs, medicaments and biological substances status: Secondary | ICD-10-CM | POA: Diagnosis not present

## 2017-05-13 DIAGNOSIS — R7401 Elevation of levels of liver transaminase levels: Secondary | ICD-10-CM

## 2017-05-13 DIAGNOSIS — R740 Nonspecific elevation of levels of transaminase and lactic acid dehydrogenase [LDH]: Secondary | ICD-10-CM

## 2017-05-13 DIAGNOSIS — E785 Hyperlipidemia, unspecified: Secondary | ICD-10-CM | POA: Diagnosis present

## 2017-05-13 DIAGNOSIS — N179 Acute kidney failure, unspecified: Secondary | ICD-10-CM | POA: Diagnosis not present

## 2017-05-13 DIAGNOSIS — R509 Fever, unspecified: Secondary | ICD-10-CM | POA: Diagnosis not present

## 2017-05-13 DIAGNOSIS — R7881 Bacteremia: Secondary | ICD-10-CM | POA: Diagnosis present

## 2017-05-13 DIAGNOSIS — R109 Unspecified abdominal pain: Secondary | ICD-10-CM | POA: Diagnosis present

## 2017-05-13 DIAGNOSIS — R1084 Generalized abdominal pain: Secondary | ICD-10-CM | POA: Diagnosis not present

## 2017-05-13 DIAGNOSIS — K8051 Calculus of bile duct without cholangitis or cholecystitis with obstruction: Secondary | ICD-10-CM | POA: Diagnosis not present

## 2017-05-13 DIAGNOSIS — R945 Abnormal results of liver function studies: Secondary | ICD-10-CM | POA: Diagnosis not present

## 2017-05-13 DIAGNOSIS — Z79899 Other long term (current) drug therapy: Secondary | ICD-10-CM

## 2017-05-13 DIAGNOSIS — K8309 Other cholangitis: Secondary | ICD-10-CM | POA: Diagnosis not present

## 2017-05-13 DIAGNOSIS — D72829 Elevated white blood cell count, unspecified: Secondary | ICD-10-CM | POA: Diagnosis not present

## 2017-05-13 DIAGNOSIS — L299 Pruritus, unspecified: Secondary | ICD-10-CM

## 2017-05-13 DIAGNOSIS — R933 Abnormal findings on diagnostic imaging of other parts of digestive tract: Secondary | ICD-10-CM | POA: Diagnosis not present

## 2017-05-13 LAB — URINALYSIS, ROUTINE W REFLEX MICROSCOPIC
BILIRUBIN URINE: NEGATIVE
Glucose, UA: NEGATIVE mg/dL
Hgb urine dipstick: NEGATIVE
KETONES UR: NEGATIVE mg/dL
LEUKOCYTES UA: NEGATIVE
NITRITE: NEGATIVE
PH: 5 (ref 5.0–8.0)
PROTEIN: NEGATIVE mg/dL
Specific Gravity, Urine: 1.014 (ref 1.005–1.030)

## 2017-05-13 LAB — CBC WITH DIFFERENTIAL/PLATELET
BASOS ABS: 0 10*3/uL (ref 0.0–0.1)
BASOS PCT: 0 %
EOS PCT: 0 %
Eosinophils Absolute: 0 10*3/uL (ref 0.0–0.7)
HCT: 35.4 % — ABNORMAL LOW (ref 39.0–52.0)
Hemoglobin: 12.2 g/dL — ABNORMAL LOW (ref 13.0–17.0)
LYMPHS PCT: 5 %
Lymphs Abs: 0.7 10*3/uL (ref 0.7–4.0)
MCH: 30.8 pg (ref 26.0–34.0)
MCHC: 34.5 g/dL (ref 30.0–36.0)
MCV: 89.4 fL (ref 78.0–100.0)
Monocytes Absolute: 0.4 10*3/uL (ref 0.1–1.0)
Monocytes Relative: 3 %
Neutro Abs: 12.8 10*3/uL — ABNORMAL HIGH (ref 1.7–7.7)
Neutrophils Relative %: 92 %
PLATELETS: 191 10*3/uL (ref 150–400)
RBC: 3.96 MIL/uL — AB (ref 4.22–5.81)
RDW: 12.5 % (ref 11.5–15.5)
WBC: 13.9 10*3/uL — AB (ref 4.0–10.5)

## 2017-05-13 LAB — COMPREHENSIVE METABOLIC PANEL
ALBUMIN: 3.5 g/dL (ref 3.5–5.0)
ALT: 355 U/L — AB (ref 17–63)
AST: 450 U/L — AB (ref 15–41)
Alkaline Phosphatase: 401 U/L — ABNORMAL HIGH (ref 38–126)
Anion gap: 9 (ref 5–15)
BUN: 36 mg/dL — AB (ref 6–20)
CHLORIDE: 97 mmol/L — AB (ref 101–111)
CO2: 25 mmol/L (ref 22–32)
CREATININE: 1.91 mg/dL — AB (ref 0.61–1.24)
Calcium: 9.1 mg/dL (ref 8.9–10.3)
GFR calc Af Amer: 37 mL/min — ABNORMAL LOW (ref >60)
GFR calc non Af Amer: 32 mL/min — ABNORMAL LOW (ref >60)
GLUCOSE: 215 mg/dL — AB (ref 65–99)
POTASSIUM: 4.2 mmol/L (ref 3.5–5.1)
SODIUM: 131 mmol/L — AB (ref 135–145)
Total Bilirubin: 4.7 mg/dL — ABNORMAL HIGH (ref 0.3–1.2)
Total Protein: 7.3 g/dL (ref 6.5–8.1)

## 2017-05-13 LAB — LIPASE, BLOOD: Lipase: 60 U/L — ABNORMAL HIGH (ref 11–51)

## 2017-05-13 LAB — PROTIME-INR
INR: 1.02
Prothrombin Time: 13.3 seconds (ref 11.4–15.2)

## 2017-05-13 MED ORDER — ADULT MULTIVITAMIN W/MINERALS CH
1.0000 | ORAL_TABLET | Freq: Every day | ORAL | Status: DC
  Administered 2017-05-13 – 2017-05-17 (×4): 1 via ORAL
  Filled 2017-05-13 (×4): qty 1

## 2017-05-13 MED ORDER — ONDANSETRON HCL 4 MG/2ML IJ SOLN
4.0000 mg | Freq: Once | INTRAMUSCULAR | Status: AC
  Administered 2017-05-13: 4 mg via INTRAVENOUS
  Filled 2017-05-13: qty 2

## 2017-05-13 MED ORDER — HYDROXYZINE HCL 25 MG PO TABS
25.0000 mg | ORAL_TABLET | Freq: Four times a day (QID) | ORAL | Status: DC | PRN
Start: 2017-05-13 — End: 2017-05-17
  Administered 2017-05-14 – 2017-05-15 (×3): 25 mg via ORAL
  Filled 2017-05-13 (×3): qty 1

## 2017-05-13 MED ORDER — HYDROMORPHONE HCL 1 MG/ML IJ SOLN
1.0000 mg | Freq: Once | INTRAMUSCULAR | Status: AC
  Administered 2017-05-13: 1 mg via INTRAVENOUS
  Filled 2017-05-13: qty 1

## 2017-05-13 MED ORDER — SODIUM CHLORIDE 0.9 % IV BOLUS (SEPSIS)
1000.0000 mL | Freq: Once | INTRAVENOUS | Status: AC
  Administered 2017-05-13: 1000 mL via INTRAVENOUS

## 2017-05-13 MED ORDER — PANTOPRAZOLE SODIUM 40 MG PO TBEC
40.0000 mg | DELAYED_RELEASE_TABLET | Freq: Every day | ORAL | Status: DC
  Administered 2017-05-13 – 2017-05-17 (×5): 40 mg via ORAL
  Filled 2017-05-13 (×5): qty 1

## 2017-05-13 MED ORDER — IBUPROFEN 400 MG PO TABS
400.0000 mg | ORAL_TABLET | Freq: Once | ORAL | Status: AC
Start: 2017-05-13 — End: 2017-05-13
  Administered 2017-05-13: 23:00:00 400 mg via ORAL
  Filled 2017-05-13: qty 1

## 2017-05-13 MED ORDER — ATORVASTATIN CALCIUM 40 MG PO TABS
40.0000 mg | ORAL_TABLET | Freq: Every day | ORAL | Status: DC
  Administered 2017-05-14 – 2017-05-16 (×3): 40 mg via ORAL
  Filled 2017-05-13 (×3): qty 1

## 2017-05-13 MED ORDER — MORPHINE SULFATE (PF) 4 MG/ML IV SOLN
2.0000 mg | INTRAVENOUS | Status: DC | PRN
  Administered 2017-05-14: 2 mg via INTRAVENOUS
  Filled 2017-05-13: qty 1

## 2017-05-13 MED ORDER — SODIUM CHLORIDE 0.9 % IV SOLN
INTRAVENOUS | Status: AC
  Administered 2017-05-13 – 2017-05-15 (×4): via INTRAVENOUS

## 2017-05-13 MED ORDER — MORPHINE SULFATE (PF) 4 MG/ML IV SOLN
4.0000 mg | Freq: Once | INTRAVENOUS | Status: AC
  Administered 2017-05-13: 4 mg via INTRAVENOUS
  Filled 2017-05-13: qty 1

## 2017-05-13 MED ORDER — LORAZEPAM 2 MG/ML IJ SOLN
1.0000 mg | INTRAMUSCULAR | Status: AC | PRN
  Administered 2017-05-13: 1 mg via INTRAVENOUS
  Filled 2017-05-13: qty 1

## 2017-05-13 MED ORDER — ONDANSETRON HCL 4 MG/2ML IJ SOLN: 4.0000 mg | Freq: Four times a day (QID) | INTRAMUSCULAR | Status: DC | PRN

## 2017-05-13 MED ORDER — SODIUM CHLORIDE 0.9 % IV BOLUS (SEPSIS)
500.0000 mL | Freq: Once | INTRAVENOUS | Status: AC
  Administered 2017-05-13: 500 mL via INTRAVENOUS

## 2017-05-13 MED ORDER — LORAZEPAM 1 MG PO TABS
1.0000 mg | ORAL_TABLET | Freq: Four times a day (QID) | ORAL | Status: DC | PRN
  Administered 2017-05-15 (×2): 1 mg via ORAL
  Filled 2017-05-13 (×2): qty 1

## 2017-05-13 NOTE — H&P (Addendum)
History and Physical    Jesse Hoffman RCV:[id number] DOB: Jul 12, 1935 DOA: 05/13/2017  Referring MD/NP/PA: EDP PCP:  Patient coming from: Home  Chief Complaint: Abdominal pain and abnormal liver enzymes  HPI: Jesse Hoffman is a 81 y.o. male with medical history significant of cholangitis, choledocholithiasis , status post cholecystectomy, status post ERCP and sphincterotomy in 2006 and 2016, diabetes mellitus, hypertension reports that he developed severe itching few weeks ago as a result he went to his primary care doctor who noted significantly abnormal liver enzymes, alkaline phosphatase of 237, AST 151, ALT 223 and bilirubin of 2, he was sent to the emergency room where he had a unremarkable CT scan last week and was referred back to gastroenterology. In fact he was seen by [facility] NP this morning, with a plan for MRI/MRCP, in the meantime after he went home in about an hour he started developing progressive abdominal pain, called the GI office back and was advised to come to the emergency room. ED Course: LFTs worse from last week, alkaline phosphatase now 401, AST ALT in the 300 and 400 range and total bili is 4.7. Case was discussed with gastroenterology, they will follow up in a.m. -Patient denies any fevers or chills, no vomiting.  Review of Systems: As per HPI otherwise 14 point review of systems negative.   Past Medical History:  Diagnosis Date  . Ascending cholangitis 2006, 01/2015   2016: in setting of choledocholithiasis.  s/p ERCP/sphinct/stone extraction  . Cholangitis 01/2015  . Cholecystitis, acute with cholelithiasis 2006  . CKD (chronic kidney disease), stage III ([place])   . Diabetes mellitus without complication ([place])   . GERD (gastroesophageal reflux disease)   . Gout July 2013  . 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   Marland Kitchen HLD (hyperlipidemia)   . Hypertension   . Thrombocytopenia ([place]) 01/2015   in setting of  sepsis, cholangitis.   . Tubular adenoma of colon 09/2012   on colonoscopy diverticulosis and hemorrhoids noted as well    Past Surgical History:  Procedure Laterality Date  . ERCP N/A 02/03/2015   Procedure: ENDOSCOPIC RETROGRADE CHOLANGIOPANCREATOGRAPHY (ERCP);  Surgeon: Carol Ada, MD;  Location: [facility]COPY;  Service: Endoscopy;  Laterality: N/A;  . ERCP W/ SPHICTEROTOMY  06/2004    Ampulla in a diverticulum with multiple stones, found in the  common bile duct  . fracture arm     age 33  . LAPAROSCOPIC CHOLECYSTECTOMY  06/2004   dr Zella Richer  . WISDOM TOOTH EXTRACTION       reports that he quit smoking about 53 years ago. His smoking use included cigarettes. he has never used smokeless tobacco. He reports that he drinks alcohol. He reports that he does not use drugs.  Allergies  Allergen Reactions  . Allopurinol     hepatotoxicity    Family History  Problem Relation Age of Onset  . Multiple sclerosis Sister   . Emphysema Father   . Heart attack Father   . Heart disease Mother   . Colon cancer Neg Hx      Prior to Admission medications   Medication Sig Start Date End Date Taking? Authorizing Provider  atorvastatin (LIPITOR) 40 MG tablet Take 1 tablet (40 mg total) by mouth daily at 6 PM. Restart after 1 week, when Liver function tests normalize 02/06/15  Yes Domenic Polite, MD  hydrOXYzine (ATARAX/VISTARIL) 25 MG tablet Take 1 tablet (25 mg total) every 6 (six)  hours as needed by mouth for itching. 05/08/17  Yes Long, Wonda Olds, MD  LORazepam (ATIVAN) 1 MG tablet Take 1 tablet (1 mg total) every 6 (six) hours as needed by mouth for sleep. 05/08/17  Yes Long, Wonda Olds, MD  losartan (COZAAR) 50 MG tablet Take 50 mg by mouth daily.   Yes [provider]  metFORMIN (GLUCOPHAGE-XR) 500 MG 24 hr tablet Take 500 mg every evening by mouth.    Yes [provider]  Multiple Vitamin (MULTIVITAMIN WITH MINERALS) TABS tablet Take 1 tablet daily by mouth.   Yes  [provider]  omeprazole (PRILOSEC) 20 MG capsule Take 20 mg every evening by mouth.  03/18/17  Yes [provider]  triamterene-hydrochlorothiazide (MAXZIDE) 75-50 MG per tablet Take 1 tablet every morning by mouth.    Yes [provider]    Physical Exam: Vitals:   05/13/17 1416 05/13/17 1631 05/13/17 1725  BP: (!) 127/95 130/65 125/65  Pulse: 88 94 97  Resp: 17 20 (!) 23  Temp: 98.2 F (36.8 C)    TempSrc: Oral    SpO2: 100% 97% 99%  Weight: 78.5 kg (173 lb)    Height: 5\' 11"  (1.803 m)        Constitutional: NAD, calm, comfortable Vitals:   05/13/17 1416 05/13/17 1631 05/13/17 1725  BP: (!) 127/95 130/65 125/65  Pulse: 88 94 97  Resp: 17 20 (!) 23  Temp: 98.2 F (36.8 C)    TempSrc: Oral    SpO2: 100% 97% 99%  Weight: 78.5 kg (173 lb)    Height: 5\' 11"  (1.803 m)     Eyes: PERRL, scleral icterus noted ENMT: Mucous membranes are moist.  Neck: normal, supple Respiratory: Clear bilaterally Cardiovascular: Regular rate and rhythm, no murmurs / rubs / gallops Abdomen: soft, mild epigastric tenderness, bowel sounds present, no rigidity or rebound Musculoskeletal: No joint deformity upper and lower extremities. Ext: No edema Skin: no rashes, lesions, ulcers.  Neurologic: CN 2-12 grossly intact. Sensation intact, DTR normal. Strength 5/5 in all 4.  Psychiatric: Normal judgment and insight. Alert and oriented x 3. Normal mood.   Labs on Admission: I have personally reviewed following labs and imaging studies  CBC: Recent Labs  Lab 05/08/17 1146 05/13/17 1629  WBC 7.6 13.9*  NEUTROABS 5.9 12.8*  HGB 11.8* 12.2*  HCT 34.5* 35.4*  MCV 91.0 89.4  PLT 179 607   Basic Metabolic Panel: Recent Labs  Lab 05/08/17 1146 05/13/17 1629  NA 132* 131*  K 4.2 4.2  CL 100* 97*  CO2 23 25  GLUCOSE 179* 215*  BUN 29* 36*  CREATININE 2.00* 1.91*  CALCIUM 8.8* 9.1   GFR: Estimated Creatinine Clearance: 33.4 mL/min (A) (by C-G formula based  on SCr of 1.91 mg/dL (H)). Liver Function Tests: Recent Labs  Lab 05/08/17 1146 05/13/17 1629  AST 151* 450*  ALT 223* 355*  ALKPHOS 337* 401*  BILITOT 2.0* 4.7*  PROT 7.1 7.3  ALBUMIN 3.5 3.5   Recent Labs  Lab 05/08/17 1146 05/13/17 1629  LIPASE 54* 60*   No results for input(s): AMMONIA in the last 168 hours. Coagulation Profile: Recent Labs  Lab 05/13/17 1629  INR 1.02   Cardiac Enzymes: No results for input(s): CKTOTAL, CKMB, CKMBINDEX, TROPONINI in the last 168 hours. BNP (last 3 results) No results for input(s): PROBNP in the last 8760 hours. HbA1C: No results for input(s): HGBA1C in the last 72 hours. CBG: No results for input(s): GLUCAP in the  last 168 hours. Lipid Profile: No results for input(s): CHOL, HDL, LDLCALC, TRIG, CHOLHDL, LDLDIRECT in the last 72 hours. Thyroid Function Tests: No results for input(s): TSH, T4TOTAL, FREET4, T3FREE, THYROIDAB in the last 72 hours. Anemia Panel: No results for input(s): VITAMINB12, FOLATE, FERRITIN, TIBC, IRON, RETICCTPCT in the last 72 hours. Urine analysis:    Component Value Date/Time   COLORURINE STRAW (A) 05/08/2017 1349   APPEARANCEUR CLEAR 05/08/2017 1349   LABSPEC 1.005 05/08/2017 1349   PHURINE 7.0 05/08/2017 1349   GLUCOSEU NEGATIVE 05/08/2017 1349   HGBUR NEGATIVE 05/08/2017 1349   BILIRUBINUR NEGATIVE 05/08/2017 1349   KETONESUR NEGATIVE 05/08/2017 1349   PROTEINUR NEGATIVE 05/08/2017 1349   UROBILINOGEN 1.0 02/01/2015 2147   NITRITE NEGATIVE 05/08/2017 1349   LEUKOCYTESUR NEGATIVE 05/08/2017 1349   Sepsis Labs: @LABRCNTIP (procalcitonin:4,lacticidven:4) )No results found for this or any previous visit (from the past 240 hour(s)).   Radiological Exams on Admission: No results found.  EKG: Independently reviewed. Normal sinus rhythm no acute ST-T wave changes  Assessment/Plan  1. Obstructive jaundice with long history of choledocholithiasis -History of ERCP and sphincterotomy 2 -Suspect  recurrence of choledocholithiasis versus biliary stricture -It is also possible that the 1.2cm cystic mass in the head of the pancreas could be causing some partial obstruction although this has not significantly increased in size, less likely -Will admit to med surg floor today -MRCP without contrast due to AKI -no indication for Abx at this time, if spikes fever or worsening leukocytosis, low threshold to start Abx  2. AKI on CKD 3 -Creatinine slightly higher than baseline of 1.6 -Hold ARB, HCTZ -gentle hydration  3. Diabetes mellitus -hold metformin, sliding scale insulin  4. Pancreatic cystic mass -Slightly larger on last week's CT -Further evaluation with MRCP  5. Hypertension -Hold ARB, monitor   DVT prophylaxis: SCDs for now Code Status: Full code Family Communication: Wife at bedside Disposition Plan: Home pending workup Consults called: [facility] Admission status: Inpatient  Domenic Polite MD [facility] Pager [phone]  If 7PM-7AM, please contact night-coverage [email or web address] Password [facility]  05/13/2017, 6:02 PM

## 2017-05-13 NOTE — ED Notes (Signed)
Patient has still not urinated for urine sample.

## 2017-05-13 NOTE — Telephone Encounter (Signed)
Patient says the pain came on suddenly and is increasing. The location is the center of his abdomen. On a scale of 1 to 10 he claims a 5. He feels "doubled over" and he is shaking. Per Tye Savoy NP the patient is to go to the ED at [facility]. She will contact the hospital doctor. The patient's wife will drive the patient to the ED.

## 2017-05-13 NOTE — ED Notes (Signed)
ED Provider at bedside. 

## 2017-05-13 NOTE — ED Notes (Signed)
MRI states will come get patient around 1925.

## 2017-05-13 NOTE — Progress Notes (Addendum)
Chief Complaint:  Itching, abnormal liver labs  HPI:  Patient is a 81 year old male known remotely to Dr. Ardis Hughs.  In 2006 he was hospitalize with cholangitis, choledocholithiasis for which he underwent ERCP stone extraction and sphincterotomy by Dr. Lajoyce Corners.  Dr. or retired patient established care with Dr. Ardis Hughs in 2013 when he was hospitalized with cholestatic jaundice after starting allopurinol.  His ultrasound, CT scan, and MRCP showed nondilated bile ducts without filling defects.  There was pneumobilia present, likely from 2006 ERCP with sphincterotomy. Liver chemistries improved with antibiotics and time.   Patient was rehospitalized August 2016 with choledocholithiasis.  MRCP demonstrated a filling defect in the distal CBD as well as an 8 mm cyst in the head of the pancreas.  Patient underwent ERCP that admission by Dr Benson Norway.   A 1 cm stone was removed in pieces. Only a limited sphincterotomy could be done since ampulla was in a very large diverticulum.      Interim history:   Patient is referred by PCP Dr. Ashby Dawes for abnormal liver studies.  Patient was seen in the ED 05/08/2017 for itching x 2 weeks.  His alkaline phosphatase was found to be 337, AST 151, ALT 223, total bilirubin 2.  Lipase mildly elevated at 54.  No labs in epic between 2016 (when patient was hospitalized for choledocholithiasis) and recent ED visit.  We contacted PCPs office to get labs done in 2017 or earlier this year.  Patient has not started any new medications recently.  Because of the itching and abnormal liver studies his PCP has stopped several home medications.  Patient's wife stopped Prilosec and Maxide because she read that they could also cause itching.  He is currently only taking Atarax and Ativan.    Patient denies any abdominal pain.  No fever or chills .  His weight is stable.  His only complaint in addition to the itching is that of fatigue.  Today is the first and only day he has experienced  mild queasiness .    Past Medical History:  Diagnosis Date  . Ascending cholangitis 2006, 01/2015   2016: in setting of choledocholithiasis.  s/p ERCP/sphinct/stone extraction  . Cholangitis 01/2015  . Cholecystitis, acute with cholelithiasis 2006  . CKD (chronic kidney disease), stage III ([place])   . Diabetes mellitus without complication ([place])   . GERD (gastroesophageal reflux disease)   . Gout July 2013  . 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   Marland Kitchen HLD (hyperlipidemia)   . Hypertension   . Thrombocytopenia ([place]) 01/2015   in setting of sepsis, cholangitis.   . Tubular adenoma of colon 09/2012   on colonoscopy diverticulosis and hemorrhoids noted as well     Past Surgical History:  Procedure Laterality Date  . ERCP N/A 02/03/2015   Procedure: ENDOSCOPIC RETROGRADE CHOLANGIOPANCREATOGRAPHY (ERCP);  Surgeon: Carol Ada, MD;  Location: [facility] ENDOSCOPY;  Service: Endoscopy;  Laterality: N/A;  . ERCP W/ SPHICTEROTOMY  06/2004    Ampulla in a diverticulum with multiple stones, found in the  common bile duct  . fracture arm     age 53  . LAPAROSCOPIC CHOLECYSTECTOMY  06/2004   dr Zella Richer  . WISDOM TOOTH EXTRACTION     Family History  Problem Relation Age of Onset  . Multiple sclerosis Sister   . Emphysema Father   . Heart attack Father   . Heart disease Mother   . Colon cancer Neg Hx  Social History   Tobacco Use  . Smoking status: Former Smoker    Types: Cigarettes    Last attempt to quit: 09/21/1963    Years since quitting: 53.6  . Smokeless tobacco: Never Used  Substance Use Topics  . Alcohol use: Yes    Comment: a glass of wine every 3 days  . Drug use: No   Current Outpatient Medications  Medication Sig Dispense Refill  . hydrOXYzine (ATARAX/VISTARIL) 25 MG tablet Take 1 tablet (25 mg total) every 6 (six) hours as needed by mouth for itching. 30 tablet 0  . LORazepam (ATIVAN) 1 MG tablet Take 1 tablet (1 mg total) every 6  (six) hours as needed by mouth for sleep. 15 tablet 0  . atorvastatin (LIPITOR) 40 MG tablet Take 1 tablet (40 mg total) by mouth daily at 6 PM. Restart after 1 week, when Liver function tests normalize (Patient not taking: Reported on 05/13/2017)    . losartan (COZAAR) 50 MG tablet Take 50 mg by mouth daily.    . metFORMIN (GLUCOPHAGE-XR) 500 MG 24 hr tablet Take 500 mg every evening by mouth.     . Multiple Vitamin (MULTIVITAMIN WITH MINERALS) TABS tablet Take 1 tablet daily by mouth.    Marland Kitchen omeprazole (PRILOSEC) 20 MG capsule Take 20 mg every evening by mouth.   3  . triamterene-hydrochlorothiazide (MAXZIDE) 75-50 MG per tablet Take 1 tablet every morning by mouth.      No current facility-administered medications for this visit.    Allergies  Allergen Reactions  . Allopurinol     hepatotoxicity    Review of Systems: All systems reviewed and negative except where noted in HPI.    Physical Exam: BP 102/64   Pulse 76   Ht 5\' 11"  (1.803 m)   Wt 173 lb 9.6 oz (78.7 kg)   BMI 24.21 kg/m  Constitutional:  Well-developed, white male in no acute distress. Psychiatric: Normal mood and affect. Behavior is normal. EENT: Pupils normal.  Conjunctivae are normal. No scleral icterus. Neck supple.  Cardiovascular: Normal rate, regular rhythm. No edema Pulmonary/chest: Effort normal and breath sounds normal. No wheezing, rales or rhonchi. Abdominal: Soft, nondistended. Nontender. Bowel sounds active throughout. There are no masses palpable. No hepatomegaly. Lymphadenopathy: No cervical adenopathy noted. Neurological: Alert and oriented to person place and time. Skin: Skin is warm and dry. No rashes noted.   ASSESSMENT AND PLAN:  1. 81 yo male with a recurrence of markedly abnormal liver chemistries in a mixed pattern found after presenting to ED on 11/6 for evaluation of generalized  itching. He complains of fatigue but no abdominal pain. No fevers / fevers. No weight loss. No recent  medication changes. Patient has a hx of recurrent choledocholithiasis and is s/p ERCP with stone extraction and sphincterotomy in 2006, 2016). Last sphincterotomy was a limited one since ampulla was within a large diverticulum.  -He may have recurrent choledocholithiasis though hard to know since he has no abdominal pain. Will arrange for MRI / MRCP for evaluation of biliary tree. If biliary obstruction then he will need ERCP and Dr. Ardis Hughs could potentially do this next week.  -CMET, CBC today. I will call him with the results -If patient develops abdominal pain / fever / or chills he knows to call us back ASAP as then we may opt to bypass MRCP and proceed with ERCP.  -with exception of ativan and atarax, PCP is holding home meds given itching and abnormal liver chemistries.   2.  Pancreatic cyst, chronic. Cystic mass involving the head of the pancreas, measuring approximately 1.4 x 1.2 x 1.1 cm slightly increased in size since 2016. No evidence of pancreatic ductal dilation. No pancreatic masses elsewhere and no peripancreatic  inflammation. -This will be further evaluated on the MRI ordered for #1.   Marland Kitchen Tye Savoy, NP  05/13/2017, 10:07 AM    ADDENDUM: when patient got home after seeing me this am he called the office with complaints of acute onset of progressive upper abdominal pain. Advised ED evaluation. If abdominal pain felt to be biliary in nature then he needs admission and we will see in consultation.Meda Coffee notified [facility] ED triage as well as our inpatient Gi team that patient would be going to ED for possible choledocholithiasis.   Cc:  Merrilee Seashore, MD

## 2017-05-13 NOTE — ED Triage Notes (Signed)
The N.P. From Dr. Ardis Hughs' (gastroenterology) office phoned just prior to his arrival to inform us they had seen pt. In their office this morning, having been consulted for pt. Having elevated liver enzymes. He also has hx of common duct stone removal x 2 in the remote past. She also tells [place] that their doctor on call today is Dr. Juliann Mule. She also tells [place] pt. Has additional health problems for which he may need to be admitted. G.I. Will be happy to see as consult for possible MRCP/ERCP.

## 2017-05-13 NOTE — ED Provider Notes (Signed)
[facility] Provider Note   CSN: [id number] Arrival date & time: 05/13/17  1405     History   Chief Complaint Chief Complaint  Patient presents with  . Abdominal Pain    HPI ROWYN SPILDE is a 81 y.o. male.  HPI   Presents with concern for abdominal pain.  Began around 1PM today, located periumbilical area, like a pressure, 7/10, feels like when he had prior CBD stones in the past.  Nothing makes it better or worse, hasn't had anything to eat or drink since 8AM.  Low appetite.  In waiting room had nausea, minimal emesis.  Did not help the pain.  Last BM likely yesterday afternoon.  No diarrhea, no black or bloody stools.    Past Medical History:  Diagnosis Date  . Ascending cholangitis 2006, 01/2015   2016: in setting of choledocholithiasis.  s/p ERCP/sphinct/stone extraction  . Cholangitis 01/2015  . Cholecystitis, acute with cholelithiasis 2006  . CKD (chronic kidney disease), stage III ([place])   . Diabetes mellitus without complication ([place])   . GERD (gastroesophageal reflux disease)   . Gout July 2013  . 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.   Marland Kitchen HLD (hyperlipidemia)   . Hypertension   . Thrombocytopenia ([place]) 01/2015   in setting of sepsis, cholangitis.   . Tubular adenoma of colon 09/2012   on colonoscopy diverticulosis and hemorrhoids noted as well    Patient Active Problem List   Diagnosis Date Noted  . CKD stage 3 due to type 1 diabetes mellitus ([place]) 05/13/2017  . Elevated LFTs 05/13/2017  . Gall stones, common bile duct   . Abdominal pain 02/02/2015  . Acute renal failure superimposed on stage 3 chronic kidney disease ([place]) 02/02/2015  . Hypertension   . Diabetes mellitus without complication ([place])   . HLD (hyperlipidemia)   . GERD (gastroesophageal reflux disease)   . Epigastric pain   . Essential hypertension   . Elevated liver enzymes 01/19/2012  . Gout 12/22/2011     Past Surgical History:  Procedure Laterality Date  . ERCP N/A 02/03/2015   Procedure: ENDOSCOPIC RETROGRADE CHOLANGIOPANCREATOGRAPHY (ERCP);  Surgeon: Carol Ada, MD;  Location: [facility] ENDOSCOPY;  Service: Endoscopy;  Laterality: N/A;  . ERCP W/ SPHICTEROTOMY  06/2004    Ampulla in a diverticulum with multiple stones, found in the  common bile duct  . fracture arm     age 81  . LAPAROSCOPIC CHOLECYSTECTOMY  06/2004   dr Zella Richer  . WISDOM TOOTH EXTRACTION         Home Medications    Prior to Admission medications   Medication Sig Start Date End Date Taking? Authorizing Provider  atorvastatin (LIPITOR) 40 MG tablet Take 1 tablet (40 mg total) by mouth daily at 6 PM. Restart after 1 week, when Liver function tests normalize 02/06/15  Yes Domenic Polite, MD  hydrOXYzine (ATARAX/VISTARIL) 25 MG tablet Take 1 tablet (25 mg total) every 6 (six) hours as needed by mouth for itching. 05/08/17  Yes Long, Wonda Olds, MD  LORazepam (ATIVAN) 1 MG tablet Take 1 tablet (1 mg total) every 6 (six) hours as needed by mouth for sleep. 05/08/17  Yes Long, Wonda Olds, MD  losartan (COZAAR) 50 MG tablet Take 50 mg by mouth daily.   Yes [provider]  metFORMIN (GLUCOPHAGE-XR) 500 MG 24 hr tablet Take 500 mg every evening by mouth.    Yes [provider]  Multiple Vitamin (MULTIVITAMIN WITH MINERALS) TABS tablet Take 1 tablet daily by mouth.   Yes [provider]  omeprazole (PRILOSEC) 20 MG capsule Take 20 mg every evening by mouth.  03/18/17  Yes [provider]  triamterene-hydrochlorothiazide (MAXZIDE) 75-50 MG per tablet Take 1 tablet every morning by mouth.    Yes [provider]    Family History Family History  Problem Relation Age of Onset  . Multiple sclerosis Sister   . Emphysema Father   . Heart attack Father   . Heart disease Mother   . Colon cancer Neg Hx     Social History Social History   Tobacco Use  . Smoking status: Former Smoker     Types: Cigarettes    Last attempt to quit: 09/21/1963    Years since quitting: 53.6  . Smokeless tobacco: Never Used  Substance Use Topics  . Alcohol use: Yes    Comment: a glass of wine every 3 days  . Drug use: No     Allergies   Allopurinol   Review of Systems Review of Systems  Constitutional: Positive for appetite change and fatigue. Negative for fever.  HENT: Negative for sore throat.   Eyes: Negative for visual disturbance.  Respiratory: Negative for shortness of breath.   Cardiovascular: Negative for chest pain.  Gastrointestinal: Positive for abdominal pain, nausea and vomiting. Negative for constipation and diarrhea.  Genitourinary: Negative for difficulty urinating and dysuria.  Musculoskeletal: Negative for back pain and neck stiffness.  Skin: Negative for rash.  Neurological: Negative for syncope and headaches.     Physical Exam Updated Vital Signs BP (!) 127/95 (BP Location: Left Arm)   Pulse 88   Temp 98.2 F (36.8 C) (Oral)   Resp 17   Ht 5\' 11"  (1.803 m)   Wt 78.5 kg (173 lb)   SpO2 100%   BMI 24.13 kg/m   Physical Exam  Constitutional: He is oriented to person, place, and time. He appears well-developed and well-nourished. No distress.  HENT:  Head: Normocephalic and atraumatic.  Eyes: Conjunctivae and EOM are normal.  Neck: Normal range of motion.  Cardiovascular: Normal rate, regular rhythm, normal heart sounds and intact distal pulses. Exam reveals no gallop and no friction rub.  No murmur heard. Pulmonary/Chest: Effort normal and breath sounds normal. No respiratory distress. He has no wheezes. He has no rales.  Abdominal: Soft. He exhibits no distension. There is tenderness in the right upper quadrant and epigastric area. There is no guarding, no tenderness at McBurney's point and negative Murphy's sign.  Musculoskeletal: He exhibits no edema.  Neurological: He is alert and oriented to person, place, and time.  Skin: Skin is warm and dry.  He is not diaphoretic.  Nursing note and vitals reviewed.    ED Treatments / Results  Labs (all labs ordered are listed, but only abnormal results are displayed) Labs Reviewed  CBC WITH DIFFERENTIAL/PLATELET - Abnormal; Notable for the following components:      Result Value   WBC 13.9 (*)    RBC 3.96 (*)    Hemoglobin 12.2 (*)    HCT 35.4 (*)    Neutro Abs 12.8 (*)    All other components within normal limits  COMPREHENSIVE METABOLIC PANEL - Abnormal; Notable for the following components:   Sodium 131 (*)    Chloride 97 (*)    Glucose, Bld 215 (*)    BUN 36 (*)    Creatinine, Ser 1.91 (*)  AST 450 (*)    ALT 355 (*)    Alkaline Phosphatase 401 (*)    Total Bilirubin 4.7 (*)    GFR calc non Af Amer 32 (*)    GFR calc Af Amer 37 (*)    All other components within normal limits  LIPASE, BLOOD - Abnormal; Notable for the following components:   Lipase 60 (*)    All other components within normal limits  URINALYSIS, ROUTINE W REFLEX MICROSCOPIC - Abnormal; Notable for the following components:   Color, Urine AMBER (*)    APPearance HAZY (*)    All other components within normal limits  URINE CULTURE  CULTURE, BLOOD (ROUTINE X 2)  CULTURE, BLOOD (ROUTINE X 2)  PROTIME-INR  CBC  COMPREHENSIVE METABOLIC PANEL  PROTIME-INR    EKG  EKG Interpretation None       Radiology Mr Abdomen Mrcp Wo Contrast  Result Date: 05/13/2017 CLINICAL DATA:  Abdominal pain. Elevated liver function tests. Biliary dilatation seen on recent CT. Prior cholecystectomy. Renal insufficiency. EXAM: MRI ABDOMEN WITHOUT CONTRAST  (INCLUDING MRCP) TECHNIQUE: Multiplanar multisequence MR imaging of the abdomen was performed. Heavily T2-weighted images of the biliary and pancreatic ducts were obtained, and three-dimensional MRCP images were rendered by post processing. COMPARISON:  CT on 05/08/2017 FINDINGS: Lower chest: No acute findings. Hepatobiliary: No masses visualized on this unenhanced exam.  Prior cholecystectomy. Diffuse biliary ductal dilatation measuring 14 mm. A calculus measuring approximately 1.4 cm is seen in the distal common bile duct. Pancreas: A 12 mm simple appearing cyst is seen within the pancreatic neck on image 28/8, with possible communication with main pancreatic duct. There is no evidence of pancreatic ductal dilatation or pancreas divisum. No other pancreatic lesions are identified. Spleen:  Within normal limits in size. Adrenals/Urinary tract: Several Bosniak category 1 and 2 cysts are seen in both kidneys. In addition, there are 2 sub-cm lesions in the anterior lower pole of the left kidney which shows T1 and T2 hypointensity. These cannot be characterized without IV contrast, and small low-grade papillary renal cell carcinomas cannot be excluded. No evidence of hydronephrosis. Stomach/Bowel: No evidence of obstruction, inflammatory process, or abnormal fluid collections. Vascular/Lymphatic: No pathologically enlarged lymph nodes identified. No evidence of abdominal aortic aneurysm. Other:  None. Musculoskeletal:  No suspicious bone lesions identified. IMPRESSION: Prior cholecystectomy. Diffuse biliary ductal dilatation, with large calculus in distal common bile duct. 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 Recommend continued followup by abdomen MRI in 2 years. This recommendation follows ACR consensus guidelines: Management of Incidental Pancreatic Cysts: A White Paper of the ACR Incidental Findings Committee. [place] 9485;46:270-350. Two sub-cm indeterminate lesions in lower pole of left kidney which cannot be characterized without IV contrast. Small low-grade papillary renal cell carcinomas cannot be excluded. Recommend continued followup by abdomen MRI in 1 year. This recommendation follows ACR consensus guidelines: Management of the Incidental Renal Mass on CT: A White Paper of the ACR  Incidental Findings Committee. J Am Coll Radiol [phone]. Electronically Signed   By: Earle Gell M.D.   On: 05/13/2017 20:47   Mr 3d Recon At Scanner  Result Date: 05/13/2017 CLINICAL DATA:  Abdominal pain. Elevated liver function tests. Biliary dilatation seen on recent CT. Prior cholecystectomy. Renal insufficiency. EXAM: MRI ABDOMEN WITHOUT CONTRAST  (INCLUDING MRCP) TECHNIQUE: Multiplanar multisequence MR imaging of the abdomen was performed. Heavily T2-weighted images of the biliary and pancreatic ducts were obtained, and three-dimensional  MRCP images were rendered by post processing. COMPARISON:  CT on 05/08/2017 FINDINGS: Lower chest: No acute findings. Hepatobiliary: No masses visualized on this unenhanced exam. Prior cholecystectomy. Diffuse biliary ductal dilatation measuring 14 mm. A calculus measuring approximately 1.4 cm is seen in the distal common bile duct. Pancreas: A 12 mm simple appearing cyst is seen within the pancreatic neck on image 28/8, with possible communication with main pancreatic duct. There is no evidence of pancreatic ductal dilatation or pancreas divisum. No other pancreatic lesions are identified. Spleen:  Within normal limits in size. Adrenals/Urinary tract: Several Bosniak category 1 and 2 cysts are seen in both kidneys. In addition, there are 2 sub-cm lesions in the anterior lower pole of the left kidney which shows T1 and T2 hypointensity. These cannot be characterized without IV contrast, and small low-grade papillary renal cell carcinomas cannot be excluded. No evidence of hydronephrosis. Stomach/Bowel: No evidence of obstruction, inflammatory process, or abnormal fluid collections. Vascular/Lymphatic: No pathologically enlarged lymph nodes identified. No evidence of abdominal aortic aneurysm. Other:  None. Musculoskeletal:  No suspicious bone lesions identified. IMPRESSION: Prior cholecystectomy. Diffuse biliary ductal dilatation, with large calculus in distal  common bile duct. 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 Recommend continued followup by abdomen MRI in 2 years. This recommendation follows ACR consensus guidelines: Management of Incidental Pancreatic Cysts: A White Paper of the ACR Incidental Findings Committee. [place] 2585;27:782-423. Two sub-cm indeterminate lesions in lower pole of left kidney which cannot be characterized without IV contrast. Small low-grade papillary renal cell carcinomas cannot be excluded. Recommend continued followup by abdomen MRI in 1 year. This recommendation follows ACR consensus guidelines: Management of the Incidental Renal Mass on CT: A White Paper of the ACR Incidental Findings Committee. J Am Coll Radiol [phone]. Electronically Signed   By: Earle Gell M.D.   On: 05/13/2017 20:47   Dg Chest Port 1 View  Result Date: 05/13/2017 CLINICAL DATA:  Sudden onset fever EXAM: PORTABLE CHEST 1 VIEW COMPARISON:  02/06/2017 FINDINGS: Shallow inspiration with atelectasis in the lung bases. Heart size and pulmonary vascularity are normal for technique. No focal consolidation. No blunting of costophrenic angles. No pneumothorax. Calcification of the aorta. IMPRESSION: Shallow inspiration with atelectasis in the lung bases. Aortic atherosclerosis. Electronically Signed   By: Lucienne Capers M.D.   On: 05/13/2017 23:29    Procedures Procedures (including critical care time)  Medications Ordered in ED Medications  morphine 4 MG/ML injection 2 mg (not administered)  ondansetron (ZOFRAN) injection 4 mg (not administered)  atorvastatin (LIPITOR) tablet 40 mg (not administered)  hydrOXYzine (ATARAX/VISTARIL) tablet 25 mg (not administered)  LORazepam (ATIVAN) tablet 1 mg (not administered)  multivitamin with minerals tablet 1 tablet (1 tablet Oral Given 05/13/17 2055)  pantoprazole (PROTONIX) EC tablet 40 mg (40 mg Oral Given 05/13/17  2055)  0.9 %  sodium chloride infusion ( Intravenous New Bag/Given 05/13/17 2055)  sodium chloride 0.9 % bolus 500 mL (500 mLs Intravenous New Bag/Given 05/14/17 0215)  sodium chloride 0.9 % bolus 1,000 mL (1,000 mLs Intravenous New Bag/Given 05/13/17 1639)  ondansetron (ZOFRAN) injection 4 mg (4 mg Intravenous Given 05/13/17 1639)  morphine 4 MG/ML injection 4 mg (4 mg Intravenous Given 05/13/17 1639)  HYDROmorphone (DILAUDID) injection 1 mg (1 mg Intravenous Given 05/13/17 1833)  LORazepam (ATIVAN) injection 1 mg (1 mg Intravenous Given 05/13/17 1911)  ibuprofen (ADVIL,MOTRIN) tablet 400 mg (400 mg Oral Given 05/13/17 2328)  sodium chloride 0.9 % bolus 500 mL (0 mLs Intravenous Stopped 05/14/17 0227)     Initial Impression / Assessment and Plan / ED Course  I have reviewed the triage vital signs and the nursing notes.  Pertinent labs & imaging results that were available during my care of the patient were reviewed by me and considered in my medical decision making (see chart for details).     81yo male with history of htn, hlpd, DM, CKD, history of cholangitis, choledocholithiasis in 2006, choledocholithiasis 2016 with MRCP with Dr. Benson Norway, who presented with pruritis to the ED 11/16 and had elevated transaminases, followed up with GI today, and after appointment developed severe abdominal pain who now presents with concern for abdominal pain.  He has RUQ tenderness on exam, reports symptoms feel similar to prior choledocolithiasis and have concern for biliary obstruction as possible etiology of symptoms.    Labs done today show worsening of transaminitis. Has slight leukocytosis however is afebrile, well appearing, doubt cholangitis at this time. Discussed with Dr. Dr. Layne Benton GI, agree to forego CT at this time and have MRCP or ERCP as next tet.  GI to evaluate in AM unless patient has clinical change overnight.    Final Clinical Impressions(s) / ED Diagnoses   Final diagnoses:    Abdominal pain, RUQ  Transaminitis  Choledocholithiasis    ED Discharge Orders    None       Gareth Morgan, MD 05/14/17 [id number]

## 2017-05-13 NOTE — Patient Instructions (Signed)
If you are age 79 or older, your body mass index should be between 23-30. Your Body mass index is 24.21 kg/m. If this is out of the aforementioned range listed, please consider follow up with your Primary Care Provider.  If you are age 74 or younger, your body mass index should be between 19-25. Your Body mass index is 24.21 kg/m. If this is out of the aformentioned range listed, please consider follow up with your Primary Care Provider.   You have been scheduled for an MRI at [facility] on 05/25/17. Your appointment time is 800 am. Please arrive 15 minutes prior to your appointment time for registration purposes. Please make certain not to have anything to eat or drink 6 hours prior to your test. In addition, if you have any metal in your body, have a pacemaker or defibrillator, please be sure to let your ordering physician know. This test typically takes 45 minutes to 1 hour to complete. Should you need to reschedule, please call [phone] to do so. Address: [address]  Will call with results.  Thank you for choosing me and [facility].   Tye Savoy, NP

## 2017-05-13 NOTE — ED Triage Notes (Addendum)
Patient c/o abd pain that started today. Denies any n/v/d, constipation or urinary problems.  Patient reports that he had blood work already done at PCP this week.

## 2017-05-13 NOTE — ED Notes (Signed)
Patient given a urinal and is aware a urine sample is needed. Will notify staff when sample is ready.

## 2017-05-14 ENCOUNTER — Inpatient Hospital Stay (HOSPITAL_COMMUNITY): Payer: Medicare Other | Admitting: Anesthesiology

## 2017-05-14 ENCOUNTER — Encounter (HOSPITAL_COMMUNITY)
Admission: EM | Disposition: A | Discharge status: Home / Self Care | Payer: Self-pay | Source: Home / Self Care | Attending: Internal Medicine

## 2017-05-14 ENCOUNTER — Inpatient Hospital Stay (HOSPITAL_COMMUNITY): Payer: Medicare Other

## 2017-05-14 ENCOUNTER — Encounter (HOSPITAL_COMMUNITY): Payer: Self-pay | Admitting: *Deleted

## 2017-05-14 DIAGNOSIS — Z9049 Acquired absence of other specified parts of digestive tract: Secondary | ICD-10-CM

## 2017-05-14 DIAGNOSIS — K862 Cyst of pancreas: Secondary | ICD-10-CM

## 2017-05-14 DIAGNOSIS — K8051 Calculus of bile duct without cholangitis or cholecystitis with obstruction: Secondary | ICD-10-CM

## 2017-05-14 DIAGNOSIS — R1084 Generalized abdominal pain: Secondary | ICD-10-CM

## 2017-05-14 DIAGNOSIS — K8309 Other cholangitis: Secondary | ICD-10-CM | POA: Diagnosis present

## 2017-05-14 HISTORY — PX: ERCP: SHX5425

## 2017-05-14 LAB — BLOOD CULTURE ID PANEL (REFLEXED)
Acinetobacter baumannii: NOT DETECTED
Candida albicans: NOT DETECTED
Candida glabrata: NOT DETECTED
Candida krusei: NOT DETECTED
Candida parapsilosis: NOT DETECTED
Candida tropicalis: NOT DETECTED
Carbapenem resistance: NOT DETECTED
Enterobacter cloacae complex: NOT DETECTED
Enterobacteriaceae species: DETECTED — AB
Enterococcus species: NOT DETECTED
Escherichia coli: DETECTED — AB
Haemophilus influenzae: NOT DETECTED
Klebsiella oxytoca: NOT DETECTED
Klebsiella pneumoniae: DETECTED — AB
Listeria monocytogenes: NOT DETECTED
Neisseria meningitidis: NOT DETECTED
Proteus species: NOT DETECTED
Pseudomonas aeruginosa: NOT DETECTED
Serratia marcescens: NOT DETECTED
Staphylococcus aureus (BCID): NOT DETECTED
Staphylococcus species: NOT DETECTED
Streptococcus agalactiae: NOT DETECTED
Streptococcus pneumoniae: NOT DETECTED
Streptococcus pyogenes: NOT DETECTED
Streptococcus species: NOT DETECTED

## 2017-05-14 LAB — COMPREHENSIVE METABOLIC PANEL
ALT: 298 U/L — ABNORMAL HIGH (ref 17–63)
AST: 305 U/L — AB (ref 15–41)
Albumin: 2.7 g/dL — ABNORMAL LOW (ref 3.5–5.0)
Alkaline Phosphatase: 302 U/L — ABNORMAL HIGH (ref 38–126)
Anion gap: 11 (ref 5–15)
BILIRUBIN TOTAL: 6.2 mg/dL — AB (ref 0.3–1.2)
BUN: 39 mg/dL — AB (ref 6–20)
CHLORIDE: 104 mmol/L (ref 101–111)
CO2: 20 mmol/L — ABNORMAL LOW (ref 22–32)
Calcium: 8.2 mg/dL — ABNORMAL LOW (ref 8.9–10.3)
Creatinine, Ser: 2.17 mg/dL — ABNORMAL HIGH (ref 0.61–1.24)
GFR, EST AFRICAN AMERICAN: 32 mL/min — AB (ref >60)
GFR, EST NON AFRICAN AMERICAN: 27 mL/min — AB (ref >60)
Glucose, Bld: 240 mg/dL — ABNORMAL HIGH (ref 65–99)
POTASSIUM: 4.1 mmol/L (ref 3.5–5.1)
Sodium: 135 mmol/L (ref 135–145)
TOTAL PROTEIN: 5.9 g/dL — AB (ref 6.5–8.1)

## 2017-05-14 LAB — GLUCOSE, CAPILLARY: Glucose-Capillary: 262 mg/dL — ABNORMAL HIGH (ref 65–99)

## 2017-05-14 LAB — CBC
HEMATOCRIT: 29 % — AB (ref 39.0–52.0)
Hemoglobin: 9.9 g/dL — ABNORMAL LOW (ref 13.0–17.0)
MCH: 30.7 pg (ref 26.0–34.0)
MCHC: 34.1 g/dL (ref 30.0–36.0)
MCV: 90.1 fL (ref 78.0–100.0)
PLATELETS: 121 10*3/uL — AB (ref 150–400)
RBC: 3.22 MIL/uL — ABNORMAL LOW (ref 4.22–5.81)
RDW: 12.8 % (ref 11.5–15.5)
WBC: 12.5 10*3/uL — AB (ref 4.0–10.5)

## 2017-05-14 LAB — PROTIME-INR
INR: 1.22
PROTHROMBIN TIME: 15.3 s — AB (ref 11.4–15.2)

## 2017-05-14 SURGERY — ERCP, WITH INTERVENTION IF INDICATED
Anesthesia: General

## 2017-05-14 MED ORDER — PROPOFOL 10 MG/ML IV BOLUS
INTRAVENOUS | Status: DC | PRN
  Administered 2017-05-14: 80 mg via INTRAVENOUS

## 2017-05-14 MED ORDER — ONDANSETRON HCL 4 MG/2ML IJ SOLN
INTRAMUSCULAR | Status: AC
  Filled 2017-05-14: qty 2

## 2017-05-14 MED ORDER — SODIUM CHLORIDE 0.9 % IV BOLUS (SEPSIS)
500.0000 mL | Freq: Once | INTRAVENOUS | Status: AC
  Administered 2017-05-14: 10:00:00 500 mL via INTRAVENOUS

## 2017-05-14 MED ORDER — SUGAMMADEX SODIUM 200 MG/2ML IV SOLN
INTRAVENOUS | Status: DC | PRN
  Administered 2017-05-14: 160 mg via INTRAVENOUS

## 2017-05-14 MED ORDER — MIDAZOLAM HCL 5 MG/ML IJ SOLN: 0.5000 mg | Freq: Once | INTRAMUSCULAR | Status: DC | PRN

## 2017-05-14 MED ORDER — SUGAMMADEX SODIUM 200 MG/2ML IV SOLN
INTRAVENOUS | Status: AC
  Filled 2017-05-14: qty 2

## 2017-05-14 MED ORDER — GLUCAGON HCL RDNA (DIAGNOSTIC) 1 MG IJ SOLR
INTRAMUSCULAR | Status: DC | PRN
  Administered 2017-05-14 (×2): 0.25 mg via INTRAVENOUS

## 2017-05-14 MED ORDER — MEPERIDINE HCL 25 MG/ML IJ SOLN: 6.2500 mg | INTRAMUSCULAR | Status: DC | PRN

## 2017-05-14 MED ORDER — ROCURONIUM BROMIDE 10 MG/ML (PF) SYRINGE
PREFILLED_SYRINGE | INTRAVENOUS | Status: DC | PRN
  Administered 2017-05-14: 40 mg via INTRAVENOUS

## 2017-05-14 MED ORDER — DEXAMETHASONE SODIUM PHOSPHATE 10 MG/ML IJ SOLN
INTRAMUSCULAR | Status: AC
  Filled 2017-05-14: qty 1

## 2017-05-14 MED ORDER — ROCURONIUM BROMIDE 50 MG/5ML IV SOSY
PREFILLED_SYRINGE | INTRAVENOUS | Status: AC
  Filled 2017-05-14: qty 5

## 2017-05-14 MED ORDER — DEXAMETHASONE SODIUM PHOSPHATE 10 MG/ML IJ SOLN
INTRAMUSCULAR | Status: DC | PRN
  Administered 2017-05-14: 10 mg via INTRAVENOUS

## 2017-05-14 MED ORDER — PROMETHAZINE HCL 25 MG/ML IJ SOLN: 6.2500 mg | INTRAMUSCULAR | Status: DC | PRN

## 2017-05-14 MED ORDER — FENTANYL CITRATE (PF) 100 MCG/2ML IJ SOLN
INTRAMUSCULAR | Status: DC | PRN
  Administered 2017-05-14: 50 ug via INTRAVENOUS

## 2017-05-14 MED ORDER — PROPOFOL 10 MG/ML IV BOLUS
INTRAVENOUS | Status: AC
  Filled 2017-05-14: qty 20

## 2017-05-14 MED ORDER — INDOMETHACIN 50 MG RE SUPP
RECTAL | Status: DC | PRN
  Administered 2017-05-14: 100 mg via RECTAL

## 2017-05-14 MED ORDER — SODIUM CHLORIDE 0.9 % IV SOLN
1.5000 g | Freq: Once | INTRAVENOUS | Status: DC
  Filled 2017-05-14: qty 1.5

## 2017-05-14 MED ORDER — SODIUM CHLORIDE 0.9 % IV BOLUS (SEPSIS)
500.0000 mL | Freq: Once | INTRAVENOUS | Status: AC
  Administered 2017-05-14: 500 mL via INTRAVENOUS

## 2017-05-14 MED ORDER — LIDOCAINE 2% (20 MG/ML) 5 ML SYRINGE
INTRAMUSCULAR | Status: DC | PRN
  Administered 2017-05-14: 60 mg via INTRAVENOUS

## 2017-05-14 MED ORDER — SODIUM CHLORIDE 0.9 % IV SOLN: INTRAVENOUS | Status: DC

## 2017-05-14 MED ORDER — SODIUM CHLORIDE 0.9 % IV BOLUS (SEPSIS)
500.0000 mL | Freq: Once | INTRAVENOUS | Status: AC
  Administered 2017-05-14: 06:00:00 500 mL via INTRAVENOUS

## 2017-05-14 MED ORDER — FENTANYL CITRATE (PF) 100 MCG/2ML IJ SOLN
INTRAMUSCULAR | Status: AC
  Filled 2017-05-14: qty 2

## 2017-05-14 MED ORDER — ONDANSETRON HCL 4 MG/2ML IJ SOLN
INTRAMUSCULAR | Status: DC | PRN
  Administered 2017-05-14: 4 mg via INTRAVENOUS

## 2017-05-14 MED ORDER — GLUCAGON HCL RDNA (DIAGNOSTIC) 1 MG IJ SOLR
INTRAMUSCULAR | Status: AC
  Filled 2017-05-14: qty 1

## 2017-05-14 MED ORDER — SODIUM CHLORIDE 0.9 % IV SOLN
INTRAVENOUS | Status: DC | PRN
  Administered 2017-05-14: 35 mL

## 2017-05-14 MED ORDER — INDOMETHACIN 50 MG RE SUPP: 100.0000 mg | Freq: Once | RECTAL | Status: AC

## 2017-05-14 MED ORDER — PHENYLEPHRINE 40 MCG/ML (10ML) SYRINGE FOR IV PUSH (FOR BLOOD PRESSURE SUPPORT)
PREFILLED_SYRINGE | INTRAVENOUS | Status: DC | PRN
  Administered 2017-05-14 (×2): 80 ug via INTRAVENOUS

## 2017-05-14 MED ORDER — LIDOCAINE 2% (20 MG/ML) 5 ML SYRINGE
INTRAMUSCULAR | Status: AC
  Filled 2017-05-14: qty 5

## 2017-05-14 MED ORDER — INDOMETHACIN 50 MG RE SUPP
RECTAL | Status: AC
  Filled 2017-05-14: qty 2

## 2017-05-14 MED ORDER — PIPERACILLIN-TAZOBACTAM 3.375 G IVPB
3.3750 g | Freq: Three times a day (TID) | INTRAVENOUS | Status: DC
  Administered 2017-05-14 – 2017-05-16 (×7): 3.375 g via INTRAVENOUS
  Filled 2017-05-14 (×7): qty 50

## 2017-05-14 NOTE — Progress Notes (Signed)
PHARMACY - PHYSICIAN COMMUNICATION CRITICAL VALUE ALERT - BLOOD CULTURE IDENTIFICATION (BCID)  Results for orders placed or performed during the hospital encounter of 05/13/17  Blood Culture ID Panel (Reflexed) (Collected: 05/13/2017 11:02 PM)  Result Value Ref Range   Enterococcus species NOT DETECTED NOT DETECTED   Listeria monocytogenes NOT DETECTED NOT DETECTED   Staphylococcus species NOT DETECTED NOT DETECTED   Staphylococcus aureus NOT DETECTED NOT DETECTED   Streptococcus species NOT DETECTED NOT DETECTED   Streptococcus agalactiae NOT DETECTED NOT DETECTED   Streptococcus pneumoniae NOT DETECTED NOT DETECTED   Streptococcus pyogenes NOT DETECTED NOT DETECTED   Acinetobacter baumannii NOT DETECTED NOT DETECTED   Enterobacteriaceae species DETECTED (A) NOT DETECTED   Enterobacter cloacae complex NOT DETECTED NOT DETECTED   Escherichia coli DETECTED (A) NOT DETECTED   Klebsiella oxytoca NOT DETECTED NOT DETECTED   Klebsiella pneumoniae DETECTED (A) NOT DETECTED   Proteus species NOT DETECTED NOT DETECTED   Serratia marcescens NOT DETECTED NOT DETECTED   Carbapenem resistance NOT DETECTED NOT DETECTED   Haemophilus influenzae NOT DETECTED NOT DETECTED   Neisseria meningitidis NOT DETECTED NOT DETECTED   Pseudomonas aeruginosa NOT DETECTED NOT DETECTED   Candida albicans NOT DETECTED NOT DETECTED   Candida glabrata NOT DETECTED NOT DETECTED   Candida krusei NOT DETECTED NOT DETECTED   Candida parapsilosis NOT DETECTED NOT DETECTED   Candida tropicalis NOT DETECTED NOT DETECTED    Name of physician (or Provider) Contacted: Paged Dr. Broadus John  Changes to prescribed antibiotics required: Currently on Zoysn for IAI - recommend narrow to Rocephin 2g IV q24 if possible  Kara Mead 05/14/2017  4:19 PM

## 2017-05-14 NOTE — Consult Note (Addendum)
Consultation  Referring Provider: Dr. Broadus John     Primary Care Physician:  Merrilee Seashore, MD Primary Gastroenterologist:  Dr. Ardis Hughs        Reason for Consultation:   Elevated LFT's, Choledocholithiasis           HPI:   Jesse Hoffman is a 81 y.o. Caucasian male with a past medical history of choledocholithiasis x2, the last in 2016, who presented to the ER per recommendations from our clinic after office visit discussing elevated LFTs and onset of abdominal pain.    Patient presented to our clinic to see Tye Savoy, NP yesterday, 05/13/17 and at that time had been referred by his PCP for abnormal liver studies.  Patient had been seen in the ED on 05/08/17 for itching for 2 weeks.  His alk phos was found to be 337, AST 151, ALT 223 and total bilirubin 2.  Lipase mildly elevated at 54.  Labs received from PCPs office did show normalization of liver enzymes in the interim from 2016-2018.  Many of the patient's home medications were stopped due to itching and abnormal liver studies.  At time of this visit yesterday patient denied any abdominal pain, fever or chills.  His only complaint was of "itching and fatigue".  That day he also was experiencing a "mild queasiness".    Today, the patient describes that upon returning home from our clinic yesterday he started to develop an epigastric/right upper quadrant pain around 1 PM.  This felt like a "pressure" and was rated as a 7/10.  Patient compared this to prior "common bile duct stones".  Patient expressed that nothing made this better or worse.      Currently, the patient does continue with some abdominal discomfort and did run a fever overnight, but in general is "just ready to have this procedure.    Patient denies heartburn, reflux, nausea or vomiting.  ED Course: LFTs worse from last week, alkaline phosphatase now 401, AST ALT in the 300 and 400 range and total bili is 4.7. -Patient denied any fevers or chills, no  vomiting.  Previous GI history: 2006-hospitalized with cholangitis, choledocholithiasis for which he underwent ERCP with stone extraction and sphincterotomy 2013-hospitalized with cholestatic jaundice after starting allopurinol, liver chemistry improved with antibiotics and time August 2016-hospitalized with choledocholithiasis, MRCP demonstrated a filling defect in the distal CBD as well as an 8 mm cyst in the head of the pancreas, ERCP by Dr. Benson Norway with removal of a 1 cm stone in pieces, only a limited sphincterotomy could be done since ampulla was in a very large diverticulum  Past Medical History:  Diagnosis Date  . Ascending cholangitis 2006, 01/2015   2016: in setting of choledocholithiasis.  s/p ERCP/sphinct/stone extraction  . Cholangitis 01/2015  . Cholecystitis, acute with cholelithiasis 2006  . CKD (chronic kidney disease), stage III ([place])   . Diabetes mellitus without complication ([place])   . GERD (gastroesophageal reflux disease)   . Gout July 2013  . 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   Marland Kitchen HLD (hyperlipidemia)   . Hypertension   . Thrombocytopenia ([place]) 01/2015   in setting of sepsis, cholangitis.   . Tubular adenoma of colon 09/2012   on colonoscopy diverticulosis and hemorrhoids noted as well    Past Surgical History:  Procedure Laterality Date  . ERCP N/A 02/03/2015   Procedure: ENDOSCOPIC RETROGRADE CHOLANGIOPANCREATOGRAPHY (ERCP);  Surgeon: Carol Ada, MD;  Location: [facility]  ENDOSCOPY;  Service: Endoscopy;  Laterality: N/A;  . ERCP W/ SPHICTEROTOMY  06/2004    Ampulla in a diverticulum with multiple stones, found in the  common bile duct  . fracture arm     age 81  . LAPAROSCOPIC CHOLECYSTECTOMY  06/2004   dr Zella Richer  . WISDOM TOOTH EXTRACTION      Family History  Problem Relation Age of Onset  . Multiple sclerosis Sister   . Emphysema Father   . Heart attack Father   . Heart disease Mother   . Colon cancer Neg Hx       Social History   Tobacco Use  . Smoking status: Former Smoker    Types: Cigarettes    Last attempt to quit: 09/21/1963    Years since quitting: 53.6  . Smokeless tobacco: Never Used  Substance Use Topics  . Alcohol use: Yes    Comment: a glass of wine every 3 days  . Drug use: No    Prior to Admission medications   Medication Sig Start Date End Date Taking? Authorizing Provider  atorvastatin (LIPITOR) 40 MG tablet Take 1 tablet (40 mg total) by mouth daily at 6 PM. Restart after 1 week, when Liver function tests normalize 02/06/15  Yes Domenic Polite, MD  hydrOXYzine (ATARAX/VISTARIL) 25 MG tablet Take 1 tablet (25 mg total) every 6 (six) hours as needed by mouth for itching. 05/08/17  Yes Long, Wonda Olds, MD  LORazepam (ATIVAN) 1 MG tablet Take 1 tablet (1 mg total) every 6 (six) hours as needed by mouth for sleep. 05/08/17  Yes Long, Wonda Olds, MD  losartan (COZAAR) 50 MG tablet Take 50 mg by mouth daily.   Yes [provider]  metFORMIN (GLUCOPHAGE-XR) 500 MG 24 hr tablet Take 500 mg every evening by mouth.    Yes [provider]  Multiple Vitamin (MULTIVITAMIN WITH MINERALS) TABS tablet Take 1 tablet daily by mouth.   Yes [provider]  omeprazole (PRILOSEC) 20 MG capsule Take 20 mg every evening by mouth.  03/18/17  Yes [provider]  triamterene-hydrochlorothiazide (MAXZIDE) 75-50 MG per tablet Take 1 tablet every morning by mouth.    Yes [provider]    Current Facility-Administered Medications  Medication Dose Route Frequency Provider Last Rate Last Dose  . 0.9 %  sodium chloride infusion   Intravenous Continuous Domenic Polite, MD 75 mL/hr at 05/13/17 2055    . atorvastatin (LIPITOR) tablet 40 mg  40 mg Oral q1800 Domenic Polite, MD      . hydrOXYzine (ATARAX/VISTARIL) tablet 25 mg  25 mg Oral Q6H PRN Domenic Polite, MD      . LORazepam (ATIVAN) tablet 1 mg  1 mg Oral Q6H PRN Domenic Polite, MD      . morphine 4 MG/ML  injection 2 mg  2 mg Intravenous Q3H PRN Domenic Polite, MD      . multivitamin with minerals tablet 1 tablet  1 tablet Oral Daily Domenic Polite, MD   1 tablet at 05/13/17 2055  . ondansetron (ZOFRAN) injection 4 mg  4 mg Intravenous Q6H PRN Domenic Polite, MD      . pantoprazole (PROTONIX) EC tablet 40 mg  40 mg Oral Daily Domenic Polite, MD   40 mg at 05/13/17 2055  . sodium chloride 0.9 % bolus 500 mL  500 mL Intravenous Once Jeryl Columbia, NP 250 mL/hr at 05/14/17 0624 500 mL at 05/14/17 0624    Allergies as of 05/13/2017 - Review Complete  05/13/2017  Allergen Reaction Noted  . Allopurinol  01/22/2012     Review of Systems:    Constitutional: No weight loss Skin: Positive for itching Cardiovascular: No chest pain Respiratory: No SOB  Gastrointestinal: See HPI and otherwise negative Genitourinary: No dysuria  Neurological: No headache Musculoskeletal: No new muscle or joint pain Hematologic: No bleeding  Psychiatric: No history of depression or anxiety   Physical Exam:  Vital signs in last 24 hours: Temp:  [98.2 F (36.8 C)-102.5 F (39.2 C)] 98.7 F (37.1 C) (11/22 0549) Pulse Rate:  [76-102] 102 (11/22 0549) Resp:  [17-23] 20 (11/22 0549) BP: (88-130)/(49-95) 88/51 (11/22 0550) SpO2:  [90 %-100 %] 92 % (11/22 0549) Weight:  [173 lb (78.5 kg)-173 lb 9.6 oz (78.7 kg)] 173 lb (78.5 kg) (11/21 1416) Last BM Date: 05/13/17 General:   Pleasant Caucasian male appears to be in NAD, Well developed, Well nourished, alert and cooperative Head:  Normocephalic and atraumatic. Eyes:   PEERL, EOMI. Scleral icterus. Conjunctiva pink. Ears:  Normal auditory acuity. Neck:  Supple Throat: Oral cavity and pharynx without inflammation, swelling or lesion. Lungs: Respirations even and unlabored. Lungs clear to auscultation bilaterally.   No wheezes, crackles, or rhonchi.  Heart: Normal S1, S2. No MRG. Regular rate and rhythm. No peripheral edema, cyanosis or pallor.  Abdomen:   Soft, nondistended, mild epigastric/right upper quadrant TTP no rebound or guarding. Normal bowel sounds. No appreciable masses or hepatomegaly. Rectal:  Not performed.  Msk:  Symmetrical without gross deformities. Peripheral pulses intact.  Extremities:  Without edema, no deformity or joint abnormality.  Neurologic:  Alert and  oriented x4;  grossly normal neurologically.  Skin:   Dry and intact without significant lesions or rashes. Psychiatric: Demonstrates good judgement and reason without abnormal affect or behaviors.   LAB RESULTS: Recent Labs    05/13/17 1629 05/14/17 0622  WBC 13.9* 12.5*  HGB 12.2* 9.9*  HCT 35.4* 29.0*  PLT 191 121*   BMET Recent Labs    05/13/17 1629 05/14/17 0622  NA 131* 135  K 4.2 4.1  CL 97* 104  CO2 25 20*  GLUCOSE 215* 240*  BUN 36* 39*  CREATININE 1.91* 2.17*  CALCIUM 9.1 8.2*   LFT Recent Labs    05/14/17 0622  PROT 5.9*  ALBUMIN 2.7*  AST 305*  ALT 298*  ALKPHOS 302*  BILITOT 6.2*   PT/INR Recent Labs    05/13/17 1629 05/14/17 0622  LABPROT 13.3 15.3*  INR 1.02 1.22    STUDIES: Mr Abdomen Mrcp Wo Contrast  Result Date: 05/13/2017 CLINICAL DATA:  Abdominal pain. Elevated liver function tests. Biliary dilatation seen on recent CT. Prior cholecystectomy. Renal insufficiency. EXAM: MRI ABDOMEN WITHOUT CONTRAST  (INCLUDING MRCP) TECHNIQUE: Multiplanar multisequence MR imaging of the abdomen was performed. Heavily T2-weighted images of the biliary and pancreatic ducts were obtained, and three-dimensional MRCP images were rendered by post processing. COMPARISON:  CT on 05/08/2017 FINDINGS: Lower chest: No acute findings. Hepatobiliary: No masses visualized on this unenhanced exam. Prior cholecystectomy. Diffuse biliary ductal dilatation measuring 14 mm. A calculus measuring approximately 1.4 cm is seen in the distal common bile duct. Pancreas: A 12 mm simple appearing cyst is seen within the pancreatic neck on image 28/8, with  possible communication with main pancreatic duct. There is no evidence of pancreatic ductal dilatation or pancreas divisum. No other pancreatic lesions are identified. Spleen:  Within normal limits in size. Adrenals/Urinary tract: Several Bosniak category 1 and 2 cysts are seen in  both kidneys. In addition, there are 2 sub-cm lesions in the anterior lower pole of the left kidney which shows T1 and T2 hypointensity. These cannot be characterized without IV contrast, and small low-grade papillary renal cell carcinomas cannot be excluded. No evidence of hydronephrosis. Stomach/Bowel: No evidence of obstruction, inflammatory process, or abnormal fluid collections. Vascular/Lymphatic: No pathologically enlarged lymph nodes identified. No evidence of abdominal aortic aneurysm. Other:  None. Musculoskeletal:  No suspicious bone lesions identified. IMPRESSION: Prior cholecystectomy. Diffuse biliary ductal dilatation, with large calculus in distal common bile duct. 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 Recommend continued followup by abdomen MRI in 2 years. This recommendation follows ACR consensus guidelines: Management of Incidental Pancreatic Cysts: A White Paper of the ACR Incidental Findings Committee. [place] 3976;73:419-379. Two sub-cm indeterminate lesions in lower pole of left kidney which cannot be characterized without IV contrast. Small low-grade papillary renal cell carcinomas cannot be excluded. Recommend continued followup by abdomen MRI in 1 year. This recommendation follows ACR consensus guidelines: Management of the Incidental Renal Mass on CT: A White Paper of the ACR Incidental Findings Committee. J Am Coll Radiol [phone]. Electronically Signed   By: Earle Gell M.D.   On: 05/13/2017 20:47   Mr 3d Recon At Scanner  Result Date: 05/13/2017 CLINICAL DATA:  Abdominal pain. Elevated liver function tests.  Biliary dilatation seen on recent CT. Prior cholecystectomy. Renal insufficiency. EXAM: MRI ABDOMEN WITHOUT CONTRAST  (INCLUDING MRCP) TECHNIQUE: Multiplanar multisequence MR imaging of the abdomen was performed. Heavily T2-weighted images of the biliary and pancreatic ducts were obtained, and three-dimensional MRCP images were rendered by post processing. COMPARISON:  CT on 05/08/2017 FINDINGS: Lower chest: No acute findings. Hepatobiliary: No masses visualized on this unenhanced exam. Prior cholecystectomy. Diffuse biliary ductal dilatation measuring 14 mm. A calculus measuring approximately 1.4 cm is seen in the distal common bile duct. Pancreas: A 12 mm simple appearing cyst is seen within the pancreatic neck on image 28/8, with possible communication with main pancreatic duct. There is no evidence of pancreatic ductal dilatation or pancreas divisum. No other pancreatic lesions are identified. Spleen:  Within normal limits in size. Adrenals/Urinary tract: Several Bosniak category 1 and 2 cysts are seen in both kidneys. In addition, there are 2 sub-cm lesions in the anterior lower pole of the left kidney which shows T1 and T2 hypointensity. These cannot be characterized without IV contrast, and small low-grade papillary renal cell carcinomas cannot be excluded. No evidence of hydronephrosis. Stomach/Bowel: No evidence of obstruction, inflammatory process, or abnormal fluid collections. Vascular/Lymphatic: No pathologically enlarged lymph nodes identified. No evidence of abdominal aortic aneurysm. Other:  None. Musculoskeletal:  No suspicious bone lesions identified. IMPRESSION: Prior cholecystectomy. Diffuse biliary ductal dilatation, with large calculus in distal common bile duct. 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 Recommend continued followup by abdomen MRI in 2 years. This recommendation follows ACR consensus guidelines:  Management of Incidental Pancreatic Cysts: A White Paper of the ACR Incidental Findings Committee. [place] 2426;83:419-622. Two sub-cm indeterminate lesions in lower pole of left kidney which cannot be characterized without IV contrast. Small low-grade papillary renal cell carcinomas cannot be excluded. Recommend continued followup by abdomen MRI in 1 year. This recommendation follows ACR consensus guidelines: Management of the Incidental Renal Mass on CT: A White Paper of the ACR Incidental Findings Committee. J Am Coll Radiol [phone]. Electronically  Signed   By: Earle Gell M.D.   On: 05/13/2017 20:47   Dg Chest Port 1 View  Result Date: 05/13/2017 CLINICAL DATA:  Sudden onset fever EXAM: PORTABLE CHEST 1 VIEW COMPARISON:  02/06/2017 FINDINGS: Shallow inspiration with atelectasis in the lung bases. Heart size and pulmonary vascularity are normal for technique. No focal consolidation. No blunting of costophrenic angles. No pneumothorax. Calcification of the aorta. IMPRESSION: Shallow inspiration with atelectasis in the lung bases. Aortic atherosclerosis. Electronically Signed   By: Lucienne Capers M.D.   On: 05/13/2017 23:29   PREVIOUS ENDOSCOPIES:            See HPI   Impression / Plan:   Impression: 1.  Choledocholithiasis: Seen on MRCP, elevated liver enzymes, patient did run a fever overnight, history of 2 previous ERCPs with sphincterotomy, the last in 2016 2.  AKI on CKD 3 3.  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  Plan for ERCP this morning with Dr. Fuller Plan.  Did discuss risks, benefits, limitations and alternatives and the patient agrees to proceed. 2.  Patient to remain n.p.o. until after time of procedure this morning 3.  Continue to trend LFTs 4. Continue supportive measures 5.  Please await any further recommendations from Dr. Fuller Plan Dr. Silverio Decamp after time of ERCP.   Thank you for your kind consultation, we will continue to  follow.  Lavone Nian Lemmon  05/14/2017, 8:22 AM Pager #: [phone]   Attending physician's note   I have taken a history, examined the patient and reviewed the chart. I agree with the Advanced Practitioner's note, impression and recommendations. S/p cholecystetctomy with recurrent cbd obstruction with calculus/sludge, patient is symptomatic with abdominal pain slightly improved since admission. Pancreatic cyst 33m benign appearing.  No fever with mild leucocytosis Plan to proceed with ERCP this afternoon by Dr SFuller PlanThe risks and benefits as well as alternatives of endoscopic procedure(s) have been discussed and reviewed. All questions answered. The patient agrees to proceed.    KDamaris Hippo MD [phone]-Fri 8a-5p [phone] 5p, weekends, holidays

## 2017-05-14 NOTE — Progress Notes (Signed)
Pharmacy Antibiotic Note  Jesse Hoffman is a 81 y.o. male with hx cholangitis admitted on 05/13/2017 with c/o abdominal pain.  Plan for ERCP on 05/14/17.  To start zosyn for suspected intra-abdominal infection/Choledocholithiasis. .  Today, 05/14/2017: -  Tmax 102.5, wbc down 12.5 -  scr up 2.17 (crcl~29)  Plan: - zosyn 3.375 gm IV q8h (infuse over 4 hrs) - f/u renal function _________________________________  Height: 5\' 11"  (180.3 cm) Weight: 173 lb (78.5 kg) IBW/kg (Calculated) : 75.3  Temp (24hrs), Avg:99.9 F (37.7 C), Min:97.6 F (36.4 C), Max:102.5 F (39.2 C)  Recent Labs  Lab 05/08/17 1146 05/13/17 1629 05/14/17 0622  WBC 7.6 13.9* 12.5*  CREATININE 2.00* 1.91* 2.17*    Estimated Creatinine Clearance: 29.4 mL/min (A) (by C-G formula based on SCr of 2.17 mg/dL (H)).    Allergies  Allergen Reactions  . Allopurinol     hepatotoxicity    Antimicrobials this admission:  11/22 zosyn>>  Dose adjustments this admission:  --  Microbiology results:  11/21 BCx x2: 11/21 UCx:   Thank you for allowing pharmacy to be a part of this patient's care.  Lynelle Doctor 05/14/2017 10:05 AM

## 2017-05-14 NOTE — Progress Notes (Signed)
Patient temperature was 101.2 after Ibuprofen was given 2 hours ago. N.P. Informed. Ordered another 536ml bolus and sponge bath. Will continue to monitor.

## 2017-05-14 NOTE — Anesthesia Preprocedure Evaluation (Addendum)
Anesthesia Evaluation  Patient identified by MRN, date of birth, ID band Patient awake    Reviewed: Allergy & Precautions, NPO status , Patient's Chart, lab work & pertinent test results  History of Anesthesia Complications Negative for: history of anesthetic complications  Airway Mallampati: III  TM Distance: >3 FB Neck ROM: Full    Dental  (+) Dental Advisory Given   Pulmonary neg pulmonary ROS, former smoker (quit 1965),    breath sounds clear to auscultation       Cardiovascular hypertension, Pt. on medications (-) angina Rhythm:Regular Rate:Normal     Neuro/Psych negative neurological ROS     GI/Hepatic GERD  Medicated and Controlled,(+) Hepatitis -, B  Endo/Other  diabetes (glu 270), Oral Hypoglycemic Agents  Renal/GU Renal InsufficiencyRenal disease (creat 2.17)     Musculoskeletal   Abdominal   Peds  Hematology  (+) Blood dyscrasia (Hb 9.9, plt 121k), anemia ,   Anesthesia Other Findings   Reproductive/Obstetrics                            Anesthesia Physical Anesthesia Plan  ASA: II and emergent  Anesthesia Plan: General   Post-op Pain Management:    Induction: Intravenous  PONV Risk Score and Plan: 1 and Treatment may vary due to age or medical condition and Ondansetron  Airway Management Planned: Oral ETT  Additional Equipment:   Intra-op Plan:   Post-operative Plan: Extubation in OR  Informed Consent: I have reviewed the patients History and Physical, chart, labs and discussed the procedure including the risks, benefits and alternatives for the proposed anesthesia with the patient or authorized representative who has indicated his/her understanding and acceptance.   Dental advisory given  Plan Discussed with: CRNA and Surgeon  Anesthesia Plan Comments: (Plan routine monitors, GETA)        Anesthesia Quick Evaluation

## 2017-05-14 NOTE — Anesthesia Postprocedure Evaluation (Signed)
Anesthesia Post Note  Patient: Jesse Hoffman  Procedure(s) Performed: ENDOSCOPIC RETROGRADE CHOLANGIOPANCREATOGRAPHY (ERCP) (N/A )     Patient location during evaluation: PACU Anesthesia Type: General Level of consciousness: awake and alert, patient cooperative and oriented Pain management: pain level controlled Vital Signs Assessment: post-procedure vital signs reviewed and stable Respiratory status: spontaneous breathing, nonlabored ventilation, respiratory function stable and patient connected to nasal cannula oxygen Cardiovascular status: blood pressure returned to baseline and stable Postop Assessment: no apparent nausea or vomiting Anesthetic complications: no    Last Vitals:  Vitals:   05/14/17 1355 05/14/17 1410  BP: 105/65 124/86  Pulse: 68 63  Resp: 15 16  Temp: 36.4 C 36.5 C  SpO2: 99% 100%    Last Pain:  Vitals:   05/14/17 1225  TempSrc: Oral  PainSc: 2                  Clydell Alberts,E. Evamarie Raetz

## 2017-05-14 NOTE — Interval H&P Note (Signed)
History and Physical Interval Note:  05/14/2017 12:34 PM  Jesse Hoffman  has presented today for surgery, with the diagnosis of Choledocholithiasis  The various methods of treatment have been discussed with the patient and family. After consideration of risks, benefits and other options for treatment, the patient has consented to  Procedure(s): ENDOSCOPIC RETROGRADE CHOLANGIOPANCREATOGRAPHY (ERCP) (N/A) as a surgical intervention .  The patient's history has been reviewed, patient examined, no change in status, stable for surgery.  I have reviewed the patient's chart and labs.  Questions were answered to the patient's satisfaction.     Pricilla Riffle. Fuller Plan

## 2017-05-14 NOTE — Anesthesia Procedure Notes (Signed)
Procedure Name: Intubation Date/Time: 05/14/2017 12:54 PM Performed by: Sharlette Dense, CRNA Pre-anesthesia Checklist: Patient identified Patient Re-evaluated:Patient Re-evaluated prior to induction Oxygen Delivery Method: Circle system utilized Preoxygenation: Pre-oxygenation with 100% oxygen Induction Type: IV induction Ventilation: Oral airway inserted - appropriate to patient size and Mask ventilation without difficulty Laryngoscope Size: Miller and 3 Grade View: Grade I Tube type: Oral Tube size: 7.5 mm Number of attempts: 1 Airway Equipment and Method: Stylet Placement Confirmation: ETT inserted through vocal cords under direct vision,  positive ETCO2 and breath sounds checked- equal and bilateral Secured at: 22 cm Tube secured with: Tape Dental Injury: Teeth and Oropharynx as per pre-operative assessment

## 2017-05-14 NOTE — H&P (View-Only) (Signed)
   Consultation  Referring Provider: Dr. Joseph     Primary Care Physician:  Ramachandran, Ajith, MD Primary Gastroenterologist:  Dr. Jacobs        Reason for Consultation:   Elevated LFT's, Choledocholithiasis           HPI:   Jesse Hoffman is a 81 y.o. Caucasian male with a past medical history of choledocholithiasis x2, the last in 2016, who presented to the ER per recommendations from our clinic after office visit discussing elevated LFTs and onset of abdominal pain.    Patient presented to our clinic to see Paula Guenther, NP yesterday, 05/13/17 and at that time had been referred by his PCP for abnormal liver studies.  Patient had been seen in the ED on 05/08/17 for itching for 2 weeks.  His alk phos was found to be 337, AST 151, ALT 223 and total bilirubin 2.  Lipase mildly elevated at 54.  Labs received from PCPs office did show normalization of liver enzymes in the interim from 2016-2018.  Many of the patient's home medications were stopped due to itching and abnormal liver studies.  At time of this visit yesterday patient denied any abdominal pain, fever or chills.  His only complaint was of "itching and fatigue".  That day he also was experiencing a "mild queasiness".    Today, the patient describes that upon returning home from our clinic yesterday he started to develop an epigastric/right upper quadrant pain around 1 PM.  This felt like a "pressure" and was rated as a 7/10.  Patient compared this to prior "common bile duct stones".  Patient expressed that nothing made this better or worse.      Currently, the patient does continue with some abdominal discomfort and did run a fever overnight, but in general is "just ready to have this procedure.    Patient denies heartburn, reflux, nausea or vomiting.  ED Course: LFTs worse from last week, alkaline phosphatase now 401, AST ALT in the 300 and 400 range and total bili is 4.7. -Patient denied any fevers or chills, no  vomiting.  Previous GI history: 2006-hospitalized with cholangitis, choledocholithiasis for which he underwent ERCP with stone extraction and sphincterotomy 2013-hospitalized with cholestatic jaundice after starting allopurinol, liver chemistry improved with antibiotics and time August 2016-hospitalized with choledocholithiasis, MRCP demonstrated a filling defect in the distal CBD as well as an 8 mm cyst in the head of the pancreas, ERCP by Dr. Hung with removal of a 1 cm stone in pieces, only a limited sphincterotomy could be done since ampulla was in a very large diverticulum  Past Medical History:  Diagnosis Date  . Ascending cholangitis 2006, 01/2015   2016: in setting of choledocholithiasis.  s/p ERCP/sphinct/stone extraction  . Cholangitis 01/2015  . Cholecystitis, acute with cholelithiasis 2006  . CKD (chronic kidney disease), stage III (HCC)   . Diabetes mellitus without complication (HCC)   . GERD (gastroesophageal reflux disease)   . Gout July 2013  . 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   . HLD (hyperlipidemia)   . Hypertension   . Thrombocytopenia (HCC) 01/2015   in setting of sepsis, cholangitis.   . Tubular adenoma of colon 09/2012   on colonoscopy diverticulosis and hemorrhoids noted as well    Past Surgical History:  Procedure Laterality Date  . ERCP N/A 02/03/2015   Procedure: ENDOSCOPIC RETROGRADE CHOLANGIOPANCREATOGRAPHY (ERCP);  Surgeon: Patrick Hung, MD;  Location: [facility]   ENDOSCOPY;  Service: Endoscopy;  Laterality: N/A;  . ERCP W/ SPHICTEROTOMY  06/2004    Ampulla in a diverticulum with multiple stones, found in the  common bile duct  . fracture arm     age 8  . LAPAROSCOPIC CHOLECYSTECTOMY  06/2004   dr Rosenbower  . WISDOM TOOTH EXTRACTION      Family History  Problem Relation Age of Onset  . Multiple sclerosis Sister   . Emphysema Father   . Heart attack Father   . Heart disease Mother   . Colon cancer Neg Hx       Social History   Tobacco Use  . Smoking status: Former Smoker    Types: Cigarettes    Last attempt to quit: 09/21/1963    Years since quitting: 53.6  . Smokeless tobacco: Never Used  Substance Use Topics  . Alcohol use: Yes    Comment: a glass of wine every 3 days  . Drug use: No    Prior to Admission medications   Medication Sig Start Date End Date Taking? Authorizing Provider  atorvastatin (LIPITOR) 40 MG tablet Take 1 tablet (40 mg total) by mouth daily at 6 PM. Restart after 1 week, when Liver function tests normalize 02/06/15  Yes Joseph, Preetha, MD  hydrOXYzine (ATARAX/VISTARIL) 25 MG tablet Take 1 tablet (25 mg total) every 6 (six) hours as needed by mouth for itching. 05/08/17  Yes Long, Joshua G, MD  LORazepam (ATIVAN) 1 MG tablet Take 1 tablet (1 mg total) every 6 (six) hours as needed by mouth for sleep. 05/08/17  Yes Long, Joshua G, MD  losartan (COZAAR) 50 MG tablet Take 50 mg by mouth daily.   Yes [provider]  metFORMIN (GLUCOPHAGE-XR) 500 MG 24 hr tablet Take 500 mg every evening by mouth.    Yes [provider]  Multiple Vitamin (MULTIVITAMIN WITH MINERALS) TABS tablet Take 1 tablet daily by mouth.   Yes [provider]  omeprazole (PRILOSEC) 20 MG capsule Take 20 mg every evening by mouth.  03/18/17  Yes [provider]  triamterene-hydrochlorothiazide (MAXZIDE) 75-50 MG per tablet Take 1 tablet every morning by mouth.    Yes [provider]    Current Facility-Administered Medications  Medication Dose Route Frequency Provider Last Rate Last Dose  . 0.9 %  sodium chloride infusion   Intravenous Continuous Joseph, Preetha, MD 75 mL/hr at 05/13/17 2055    . atorvastatin (LIPITOR) tablet 40 mg  40 mg Oral q1800 Joseph, Preetha, MD      . hydrOXYzine (ATARAX/VISTARIL) tablet 25 mg  25 mg Oral Q6H PRN Joseph, Preetha, MD      . LORazepam (ATIVAN) tablet 1 mg  1 mg Oral Q6H PRN Joseph, Preetha, MD      . morphine 4 MG/ML  injection 2 mg  2 mg Intravenous Q3H PRN Joseph, Preetha, MD      . multivitamin with minerals tablet 1 tablet  1 tablet Oral Daily Joseph, Preetha, MD   1 tablet at 05/13/17 2055  . ondansetron (ZOFRAN) injection 4 mg  4 mg Intravenous Q6H PRN Joseph, Preetha, MD      . pantoprazole (PROTONIX) EC tablet 40 mg  40 mg Oral Daily Joseph, Preetha, MD   40 mg at 05/13/17 2055  . sodium chloride 0.9 % bolus 500 mL  500 mL Intravenous Once Schorr, Katherine P, NP 250 mL/hr at 05/14/17 0624 500 mL at 05/14/17 0624    Allergies as of 05/13/2017 - Review Complete   05/13/2017  Allergen Reaction Noted  . Allopurinol  01/22/2012     Review of Systems:    Constitutional: No weight loss Skin: Positive for itching Cardiovascular: No chest pain Respiratory: No SOB  Gastrointestinal: See HPI and otherwise negative Genitourinary: No dysuria  Neurological: No headache Musculoskeletal: No new muscle or joint pain Hematologic: No bleeding  Psychiatric: No history of depression or anxiety   Physical Exam:  Vital signs in last 24 hours: Temp:  [98.2 F (36.8 C)-102.5 F (39.2 C)] 98.7 F (37.1 C) (11/22 0549) Pulse Rate:  [76-102] 102 (11/22 0549) Resp:  [17-23] 20 (11/22 0549) BP: (88-130)/(49-95) 88/51 (11/22 0550) SpO2:  [90 %-100 %] 92 % (11/22 0549) Weight:  [173 lb (78.5 kg)-173 lb 9.6 oz (78.7 kg)] 173 lb (78.5 kg) (11/21 1416) Last BM Date: 05/13/17 General:   Pleasant Caucasian male appears to be in NAD, Well developed, Well nourished, alert and cooperative Head:  Normocephalic and atraumatic. Eyes:   PEERL, EOMI. Scleral icterus. Conjunctiva pink. Ears:  Normal auditory acuity. Neck:  Supple Throat: Oral cavity and pharynx without inflammation, swelling or lesion. Lungs: Respirations even and unlabored. Lungs clear to auscultation bilaterally.   No wheezes, crackles, or rhonchi.  Heart: Normal S1, S2. No MRG. Regular rate and rhythm. No peripheral edema, cyanosis or pallor.  Abdomen:   Soft, nondistended, mild epigastric/right upper quadrant TTP no rebound or guarding. Normal bowel sounds. No appreciable masses or hepatomegaly. Rectal:  Not performed.  Msk:  Symmetrical without gross deformities. Peripheral pulses intact.  Extremities:  Without edema, no deformity or joint abnormality.  Neurologic:  Alert and  oriented x4;  grossly normal neurologically.  Skin:   Dry and intact without significant lesions or rashes. Psychiatric: Demonstrates good judgement and reason without abnormal affect or behaviors.   LAB RESULTS: Recent Labs    05/13/17 1629 05/14/17 0622  WBC 13.9* 12.5*  HGB 12.2* 9.9*  HCT 35.4* 29.0*  PLT 191 121*   BMET Recent Labs    05/13/17 1629 05/14/17 0622  NA 131* 135  K 4.2 4.1  CL 97* 104  CO2 25 20*  GLUCOSE 215* 240*  BUN 36* 39*  CREATININE 1.91* 2.17*  CALCIUM 9.1 8.2*   LFT Recent Labs    05/14/17 0622  PROT 5.9*  ALBUMIN 2.7*  AST 305*  ALT 298*  ALKPHOS 302*  BILITOT 6.2*   PT/INR Recent Labs    05/13/17 1629 05/14/17 0622  LABPROT 13.3 15.3*  INR 1.02 1.22    STUDIES: Mr Abdomen Mrcp Wo Contrast  Result Date: 05/13/2017 CLINICAL DATA:  Abdominal pain. Elevated liver function tests. Biliary dilatation seen on recent CT. Prior cholecystectomy. Renal insufficiency. EXAM: MRI ABDOMEN WITHOUT CONTRAST  (INCLUDING MRCP) TECHNIQUE: Multiplanar multisequence MR imaging of the abdomen was performed. Heavily T2-weighted images of the biliary and pancreatic ducts were obtained, and three-dimensional MRCP images were rendered by post processing. COMPARISON:  CT on 05/08/2017 FINDINGS: Lower chest: No acute findings. Hepatobiliary: No masses visualized on this unenhanced exam. Prior cholecystectomy. Diffuse biliary ductal dilatation measuring 14 mm. A calculus measuring approximately 1.4 cm is seen in the distal common bile duct. Pancreas: A 12 mm simple appearing cyst is seen within the pancreatic neck on image 28/8, with  possible communication with main pancreatic duct. There is no evidence of pancreatic ductal dilatation or pancreas divisum. No other pancreatic lesions are identified. Spleen:  Within normal limits in size. Adrenals/Urinary tract: Several Bosniak category 1 and 2 cysts are seen in   both kidneys. In addition, there are 2 sub-cm lesions in the anterior lower pole of the left kidney which shows T1 and T2 hypointensity. These cannot be characterized without IV contrast, and small low-grade papillary renal cell carcinomas cannot be excluded. No evidence of hydronephrosis. Stomach/Bowel: No evidence of obstruction, inflammatory process, or abnormal fluid collections. Vascular/Lymphatic: No pathologically enlarged lymph nodes identified. No evidence of abdominal aortic aneurysm. Other:  None. Musculoskeletal:  No suspicious bone lesions identified. IMPRESSION: Prior cholecystectomy. Diffuse biliary ductal dilatation, with large calculus in distal common bile duct. 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 Recommend continued followup by abdomen MRI in 2 years. This recommendation follows ACR consensus guidelines: Management of Incidental Pancreatic Cysts: A White Paper of the ACR Incidental Findings Committee. J Am Coll Radiol 2017;14:911-923. Two sub-cm indeterminate lesions in lower pole of left kidney which cannot be characterized without IV contrast. Small low-grade papillary renal cell carcinomas cannot be excluded. Recommend continued followup by abdomen MRI in 1 year. This recommendation follows ACR consensus guidelines: Management of the Incidental Renal Mass on CT: A White Paper of the ACR Incidental Findings Committee. J Am Coll Radiol 2018;15:264-273. Electronically Signed   By: John  Stahl M.D.   On: 05/13/2017 20:47   Mr 3d Recon At Scanner  Result Date: 05/13/2017 CLINICAL DATA:  Abdominal pain. Elevated liver function tests.  Biliary dilatation seen on recent CT. Prior cholecystectomy. Renal insufficiency. EXAM: MRI ABDOMEN WITHOUT CONTRAST  (INCLUDING MRCP) TECHNIQUE: Multiplanar multisequence MR imaging of the abdomen was performed. Heavily T2-weighted images of the biliary and pancreatic ducts were obtained, and three-dimensional MRCP images were rendered by post processing. COMPARISON:  CT on 05/08/2017 FINDINGS: Lower chest: No acute findings. Hepatobiliary: No masses visualized on this unenhanced exam. Prior cholecystectomy. Diffuse biliary ductal dilatation measuring 14 mm. A calculus measuring approximately 1.4 cm is seen in the distal common bile duct. Pancreas: A 12 mm simple appearing cyst is seen within the pancreatic neck on image 28/8, with possible communication with main pancreatic duct. There is no evidence of pancreatic ductal dilatation or pancreas divisum. No other pancreatic lesions are identified. Spleen:  Within normal limits in size. Adrenals/Urinary tract: Several Bosniak category 1 and 2 cysts are seen in both kidneys. In addition, there are 2 sub-cm lesions in the anterior lower pole of the left kidney which shows T1 and T2 hypointensity. These cannot be characterized without IV contrast, and small low-grade papillary renal cell carcinomas cannot be excluded. No evidence of hydronephrosis. Stomach/Bowel: No evidence of obstruction, inflammatory process, or abnormal fluid collections. Vascular/Lymphatic: No pathologically enlarged lymph nodes identified. No evidence of abdominal aortic aneurysm. Other:  None. Musculoskeletal:  No suspicious bone lesions identified. IMPRESSION: Prior cholecystectomy. Diffuse biliary ductal dilatation, with large calculus in distal common bile duct. 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 Recommend continued followup by abdomen MRI in 2 years. This recommendation follows ACR consensus guidelines:  Management of Incidental Pancreatic Cysts: A White Paper of the ACR Incidental Findings Committee. J Am Coll Radiol 2017;14:911-923. Two sub-cm indeterminate lesions in lower pole of left kidney which cannot be characterized without IV contrast. Small low-grade papillary renal cell carcinomas cannot be excluded. Recommend continued followup by abdomen MRI in 1 year. This recommendation follows ACR consensus guidelines: Management of the Incidental Renal Mass on CT: A White Paper of the ACR Incidental Findings Committee. J Am Coll Radiol 2018;15:264-273. Electronically   Signed   By: John  Stahl M.D.   On: 05/13/2017 20:47   Dg Chest Port 1 View  Result Date: 05/13/2017 CLINICAL DATA:  Sudden onset fever EXAM: PORTABLE CHEST 1 VIEW COMPARISON:  02/06/2017 FINDINGS: Shallow inspiration with atelectasis in the lung bases. Heart size and pulmonary vascularity are normal for technique. No focal consolidation. No blunting of costophrenic angles. No pneumothorax. Calcification of the aorta. IMPRESSION: Shallow inspiration with atelectasis in the lung bases. Aortic atherosclerosis. Electronically Signed   By: William  Stevens M.D.   On: 05/13/2017 23:29   PREVIOUS ENDOSCOPIES:            See HPI   Impression / Plan:   Impression: 1.  Choledocholithiasis: Seen on MRCP, elevated liver enzymes, patient did run a fever overnight, history of 2 previous ERCPs with sphincterotomy, the last in 2016 2.  AKI on CKD 3 3.  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  Plan for ERCP this morning with Dr. Stark.  Did discuss risks, benefits, limitations and alternatives and the patient agrees to proceed. 2.  Patient to remain n.p.o. until after time of procedure this morning 3.  Continue to trend LFTs 4. Continue supportive measures 5.  Please await any further recommendations from Dr. Stark/ Dr. Karlea Mckibbin after time of ERCP.   Thank you for your kind consultation, we will continue to  follow.  Jennifer Lynne Lemmon  05/14/2017, 8:22 AM Pager #: [phone]   Attending physician's note   I have taken a history, examined the patient and reviewed the chart. I agree with the Advanced Practitioner's note, impression and recommendations. S/p cholecystetctomy with recurrent cbd obstruction with calculus/sludge, patient is symptomatic with abdominal pain slightly improved since admission. Pancreatic cyst 12mm benign appearing.  No fever with mild leucocytosis Plan to proceed with ERCP this afternoon by Dr Stark The risks and benefits as well as alternatives of endoscopic procedure(s) have been discussed and reviewed. All questions answered. The patient agrees to proceed.    K Veena Vanecia Limpert, MD [phone] Mon-Fri 8a-5p [phone] after 5p, weekends, holidays 

## 2017-05-14 NOTE — Progress Notes (Signed)
PROGRESS NOTE    Jesse Hoffman  MPN:[id number] DOB: May 22, 1936 DOA: 05/13/2017 PCP: Merrilee Seashore, MD  Brief Narrative: Jesse Hoffman is a 81 y.o. male with medical history significant of cholangitis, choledocholithiasis , status post cholecystectomy, status post ERCP and sphincterotomy in 2006 and 2016, diabetes mellitus, hypertension developed severe itching few weeks ago as a result he went to his primary care doctor who noted significantly abnormal liver enzymes, alkaline phosphatase of 237, AST 151, ALT 223 and bilirubin of 2, he was sent to the emergency room where he had a unremarkable CT scan last week and was referred back to gastroenterology. Yesterday afternoon was started having pain and came to ER, admitted, MRCP with Choledocholithiases  Last pm temp 102, BP dropped to 34mmHg  Assessment & Plan:   1. Choledocholithiases/Early cholangitis -Noted on MRCP last night -History of ERCP and sphincterotomy 2 -Febrile to 102last pm and BP soft now, add IV Zosyn -GI following for ERCP/stone extraction today -IVF, supportive care  2. AKI on CKD 3 -Creatinine slightly higher than baseline of 1.6 -Hold ARB, HCTZ -continue IVF today  3. Diabetes mellitus -hold metformin, sliding scale insulin  4. Pancreatic cystic mass -needs FU with Surveillance CT down the road  5. Hypertension -Hold ARB, monitor  DVT prophylaxis: SCDs for now Code Status: Full code Family Communication: Wife at bedside Disposition Plan: Home pending workup/Rx    Consultants:   [facility]   Procedures:   Antimicrobials:    Subjective: Having some pain, febrile to 102 last pm, Bp soft Objective: Vitals:   05/14/17 0549 05/14/17 0550 05/14/17 0853 05/14/17 1024  BP: (!) 89/49 (!) 88/51  96/68  Pulse: (!) 102   91  Resp: 20   18  Temp: 98.7 F (37.1 C)  97.6 F (36.4 C) 97.6 F (36.4 C)  TempSrc: Oral  Oral Oral  SpO2: 92%   100%  Weight:      Height:         Intake/Output Summary (Last 24 hours) at 05/14/2017 1222 Last data filed at 05/14/2017 0086 Gross per 24 hour  Intake 1430.4 ml  Output -  Net 1430.4 ml   Filed Weights   05/13/17 1416  Weight: 78.5 kg (173 lb)    Examination:  General exam: Appears calm and comfortable  Respiratory system: Clear to auscultation. Respiratory effort normal. Cardiovascular system: S1 & S2 heard, RRR. No JVD, murmurs, rubs, gallops Gastrointestinal system: Abdomen is nondistended, soft and nontender.Normal bowel sounds heard. Central nervous system: Alert and oriented. No focal neurological deficits. Extremities: Symmetric 5 x 5 power. Skin: No rashes, lesions or ulcers Psychiatry: Judgement and insight appear normal. Mood & affect appropriate.     Data Reviewed:   CBC: Recent Labs  Lab 05/08/17 1146 05/13/17 1629 05/14/17 0622  WBC 7.6 13.9* 12.5*  NEUTROABS 5.9 12.8*  --   HGB 11.8* 12.2* 9.9*  HCT 34.5* 35.4* 29.0*  MCV 91.0 89.4 90.1  PLT 179 191 761*   Basic Metabolic Panel: Recent Labs  Lab 05/08/17 1146 05/13/17 1629 05/14/17 0622  NA 132* 131* 135  K 4.2 4.2 4.1  CL 100* 97* 104  CO2 23 25 20*  GLUCOSE 179* 215* 240*  BUN 29* 36* 39*  CREATININE 2.00* 1.91* 2.17*  CALCIUM 8.8* 9.1 8.2*   GFR: Estimated Creatinine Clearance: 29.4 mL/min (A) (by C-G formula based on SCr of 2.17 mg/dL (H)). Liver Function Tests: Recent Labs  Lab 05/08/17 1146 05/13/17 1629 05/14/17 0622  AST  151* 450* 305*  ALT 223* 355* 298*  ALKPHOS 337* 401* 302*  BILITOT 2.0* 4.7* 6.2*  PROT 7.1 7.3 5.9*  ALBUMIN 3.5 3.5 2.7*   Recent Labs  Lab 05/08/17 1146 05/13/17 1629  LIPASE 54* 60*   No results for input(s): AMMONIA in the last 168 hours. Coagulation Profile: Recent Labs  Lab 05/13/17 1629 05/14/17 0622  INR 1.02 1.22   Cardiac Enzymes: No results for input(s): CKTOTAL, CKMB, CKMBINDEX, TROPONINI in the last 168 hours. BNP (last 3 results) No results for input(s):  PROBNP in the last 8760 hours. HbA1C: No results for input(s): HGBA1C in the last 72 hours. CBG: No results for input(s): GLUCAP in the last 168 hours. Lipid Profile: No results for input(s): CHOL, HDL, LDLCALC, TRIG, CHOLHDL, LDLDIRECT in the last 72 hours. Thyroid Function Tests: No results for input(s): TSH, T4TOTAL, FREET4, T3FREE, THYROIDAB in the last 72 hours. Anemia Panel: No results for input(s): VITAMINB12, FOLATE, FERRITIN, TIBC, IRON, RETICCTPCT in the last 72 hours. Urine analysis:    Component Value Date/Time   COLORURINE AMBER (A) 05/13/2017 1850   APPEARANCEUR HAZY (A) 05/13/2017 1850   LABSPEC 1.014 05/13/2017 1850   PHURINE 5.0 05/13/2017 1850   GLUCOSEU NEGATIVE 05/13/2017 1850   HGBUR NEGATIVE 05/13/2017 1850   BILIRUBINUR NEGATIVE 05/13/2017 1850   KETONESUR NEGATIVE 05/13/2017 1850   PROTEINUR NEGATIVE 05/13/2017 1850   UROBILINOGEN 1.0 02/01/2015 2147   NITRITE NEGATIVE 05/13/2017 1850   LEUKOCYTESUR NEGATIVE 05/13/2017 1850   Sepsis Labs: @LABRCNTIP (procalcitonin:4,lacticidven:4)  )No results found for this or any previous visit (from the past 240 hour(s)).       Radiology Studies: Mr Abdomen Mrcp Wo Contrast  Result Date: 05/13/2017 CLINICAL DATA:  Abdominal pain. Elevated liver function tests. Biliary dilatation seen on recent CT. Prior cholecystectomy. Renal insufficiency. EXAM: MRI ABDOMEN WITHOUT CONTRAST  (INCLUDING MRCP) TECHNIQUE: Multiplanar multisequence MR imaging of the abdomen was performed. Heavily T2-weighted images of the biliary and pancreatic ducts were obtained, and three-dimensional MRCP images were rendered by post processing. COMPARISON:  CT on 05/08/2017 FINDINGS: Lower chest: No acute findings. Hepatobiliary: No masses visualized on this unenhanced exam. Prior cholecystectomy. Diffuse biliary ductal dilatation measuring 14 mm. A calculus measuring approximately 1.4 cm is seen in the distal common bile duct. Pancreas: A 12 mm  simple appearing cyst is seen within the pancreatic neck on image 28/8, with possible communication with main pancreatic duct. There is no evidence of pancreatic ductal dilatation or pancreas divisum. No other pancreatic lesions are identified. Spleen:  Within normal limits in size. Adrenals/Urinary tract: Several Bosniak category 1 and 2 cysts are seen in both kidneys. In addition, there are 2 sub-cm lesions in the anterior lower pole of the left kidney which shows T1 and T2 hypointensity. These cannot be characterized without IV contrast, and small low-grade papillary renal cell carcinomas cannot be excluded. No evidence of hydronephrosis. Stomach/Bowel: No evidence of obstruction, inflammatory process, or abnormal fluid collections. Vascular/Lymphatic: No pathologically enlarged lymph nodes identified. No evidence of abdominal aortic aneurysm. Other:  None. Musculoskeletal:  No suspicious bone lesions identified. IMPRESSION: Prior cholecystectomy. Diffuse biliary ductal dilatation, with large calculus in distal common bile duct. 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 Recommend continued followup by abdomen MRI in 2 years. This recommendation follows ACR consensus guidelines: Management of Incidental Pancreatic Cysts: A White Paper of the ACR Incidental Findings Committee. [place] 3762;83:151-761. Two sub-cm indeterminate lesions in  lower pole of left kidney which cannot be characterized without IV contrast. Small low-grade papillary renal cell carcinomas cannot be excluded. Recommend continued followup by abdomen MRI in 1 year. This recommendation follows ACR consensus guidelines: Management of the Incidental Renal Mass on CT: A White Paper of the ACR Incidental Findings Committee. J Am Coll Radiol [phone]. Electronically Signed   By: Earle Gell M.D.   On: 05/13/2017 20:47   Mr 3d Recon At Scanner  Result Date:  05/13/2017 CLINICAL DATA:  Abdominal pain. Elevated liver function tests. Biliary dilatation seen on recent CT. Prior cholecystectomy. Renal insufficiency. EXAM: MRI ABDOMEN WITHOUT CONTRAST  (INCLUDING MRCP) TECHNIQUE: Multiplanar multisequence MR imaging of the abdomen was performed. Heavily T2-weighted images of the biliary and pancreatic ducts were obtained, and three-dimensional MRCP images were rendered by post processing. COMPARISON:  CT on 05/08/2017 FINDINGS: Lower chest: No acute findings. Hepatobiliary: No masses visualized on this unenhanced exam. Prior cholecystectomy. Diffuse biliary ductal dilatation measuring 14 mm. A calculus measuring approximately 1.4 cm is seen in the distal common bile duct. Pancreas: A 12 mm simple appearing cyst is seen within the pancreatic neck on image 28/8, with possible communication with main pancreatic duct. There is no evidence of pancreatic ductal dilatation or pancreas divisum. No other pancreatic lesions are identified. Spleen:  Within normal limits in size. Adrenals/Urinary tract: Several Bosniak category 1 and 2 cysts are seen in both kidneys. In addition, there are 2 sub-cm lesions in the anterior lower pole of the left kidney which shows T1 and T2 hypointensity. These cannot be characterized without IV contrast, and small low-grade papillary renal cell carcinomas cannot be excluded. No evidence of hydronephrosis. Stomach/Bowel: No evidence of obstruction, inflammatory process, or abnormal fluid collections. Vascular/Lymphatic: No pathologically enlarged lymph nodes identified. No evidence of abdominal aortic aneurysm. Other:  None. Musculoskeletal:  No suspicious bone lesions identified. IMPRESSION: Prior cholecystectomy. Diffuse biliary ductal dilatation, with large calculus in distal common bile duct. 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 Recommend continued followup by abdomen MRI  in 2 years. This recommendation follows ACR consensus guidelines: Management of Incidental Pancreatic Cysts: A White Paper of the ACR Incidental Findings Committee. [place] 6440;34:742-595. Two sub-cm indeterminate lesions in lower pole of left kidney which cannot be characterized without IV contrast. Small low-grade papillary renal cell carcinomas cannot be excluded. Recommend continued followup by abdomen MRI in 1 year. This recommendation follows ACR consensus guidelines: Management of the Incidental Renal Mass on CT: A White Paper of the ACR Incidental Findings Committee. J Am Coll Radiol [phone]. Electronically Signed   By: Earle Gell M.D.   On: 05/13/2017 20:47   Dg Chest Port 1 View  Result Date: 05/13/2017 CLINICAL DATA:  Sudden onset fever EXAM: PORTABLE CHEST 1 VIEW COMPARISON:  02/06/2017 FINDINGS: Shallow inspiration with atelectasis in the lung bases. Heart size and pulmonary vascularity are normal for technique. No focal consolidation. No blunting of costophrenic angles. No pneumothorax. Calcification of the aorta. IMPRESSION: Shallow inspiration with atelectasis in the lung bases. Aortic atherosclerosis. Electronically Signed   By: Lucienne Capers M.D.   On: 05/13/2017 23:29        Scheduled Meds: . atorvastatin  40 mg Oral q1800  . multivitamin with minerals  1 tablet Oral Daily  . pantoprazole  40 mg Oral Daily   Continuous Infusions: . sodium chloride 100 mL/hr at 05/14/17 0944  . piperacillin-tazobactam (ZOSYN)  IV 3.375 g (  05/14/17 1131)     LOS: 1 day    Time spent: 40min    Domenic Polite, MD [facility] Page via [email or web address], password [email or web address] After 7PM please contact night-coverage  05/14/2017, 12:22 PM

## 2017-05-14 NOTE — Op Note (Signed)
[facility] Patient Name: Jesse Hoffman Procedure Date: 05/14/2017 MRN: [id number] Attending MD: Ladene Artist , MD Date of Birth: 12-12-1935 CSN: [id number] Age: 81 Admit Type: Inpatient Procedure:                ERCP Indications:              Abdominal pain of suspected biliary origin, Bile                            duct stone(s), Suspected ascending cholangitis,                            Elevated liver enzymes Providers:                Pricilla Riffle. Fuller Plan, MD, Vista Lawman, RN, Elspeth Cho Tech., Technician, Danley Danker, CRNA Referring MD:              Medicines:                General Anesthesia Complications:            No immediate complications. Estimated Blood Loss:     Estimated blood loss: none. Procedure:                Pre-Anesthesia Assessment:                           - Prior to the procedure, a History and Physical                            was performed, and patient medications and                            allergies were reviewed. The patient's tolerance of                            previous anesthesia was also reviewed. The risks                            and benefits of the procedure and the sedation                            options and risks were discussed with the patient.                            All questions were answered, and informed consent                            was obtained. Prior Anticoagulants: The patient has                            taken no previous anticoagulant or antiplatelet                            agents.  ASA Grade Assessment: III - A patient with                            severe systemic disease. After reviewing the risks                            and benefits, the patient was deemed in                            satisfactory condition to undergo the procedure.                           After obtaining informed consent, the scope was                            passed under  direct vision. Throughout the                            procedure, the patient's blood pressure, pulse, and                            oxygen saturations were monitored continuously. The                            [id number] scope was introduced through the                            mouth, and used to inject contrast into and used to                            inject contrast into the bile duct. The ERCP was                            accomplished without difficulty. The patient                            tolerated the procedure well. Scope In: Scope Out: Findings:      A scout film of the abdomen was obtained. Surgical clips, consistent       with a previous cholecystectomy, were seen in the area of the right       upper quadrant of the abdomen. The esophagus was successfully intubated       under direct vision. The scope was advanced to the major papilla in the       descending duodenum without detailed examination of the pharynx, larynx       and associated structures, and upper GI tract. The upper GI tract was       grossly normal on a limted exam except for a large periampullary       diverticulum and a small prior sphincterotomy. The ampulla was located       completely within the large periampullary diverticulum. A straight       Roadrunner wire was passed into the biliary tree. The short-nosed       traction sphincterotome was passed over the guidewire and the bile duct  was then deeply cannulated. Contrast was injected. I personally       interpreted the bile duct images. There was appropriate flow of contrast       through the ducts. The common bile duct contained filling defect(s)       thought to be a stone and sludge. The common bile duct was diffusely       dilated, with a stone causing an obstruction. The largest diameter was       12 mm. A cholecystectomy had been performed. A 10 mm extended biliary       sphincterotomy was made with a traction (standard)  sphincterotome using       ERBE electrocautery. There was no post-sphincterotomy bleeding. The       biliary tree was swept multiple times with a 12 mm balloon starting at       the bifurcation. Moderate amounts of pus was swept from the duct. Sludge       was swept from the duct. One brown stone was swept from the duct which       fragmented upon extraction. No stones or filling defects remained.       Excellent biliary drainage was noted. The PD was not cannulated or       injected by intention. Impression:               - Filling defects consistent with a stone and                            sludge was seen on the cholangiogram.                           - Dilated common bile duct, with a stone causing an                            obstruction.                           - Large periampullary diverticulum.                           - Prior cholecystectomy.                           - Prior small sphincterotomy.                           - Choledocholithiasis, sludge was found. Complete                            stone and sludge removal was accomplished by                            biliary sphincterotomy and balloon extraction.                           - An extended biliary sphincterotomy was performed.                           - Cholangitis. The biliary tree was swept and pus  was found. Moderate Sedation:      N/A- Per Anesthesia Care Recommendation:           - Avoid aspirin and nonsteroidal anti-inflammatory                            medicines for 1 week.                           - Return patient to hospital ward for ongoing care.                           - Continue IV antibiotics for cholangitis for now                            and complete at least 7 days of antibiotics.                           - Repeat LFTs tomorrow and repeat as outpatient in                            1 month.                           - Consider ongoing treatment with  ursodiol                           - Follow up with Dr. Ardis Hughs in 1 month. Procedure Code(s):        --- Professional ---                           [phone], Endoscopic retrograde                            cholangiopancreatography (ERCP); with removal of                            calculi/debris from biliary/pancreatic duct(s)                           43262, Endoscopic retrograde                            cholangiopancreatography (ERCP); with                            sphincterotomy/papillotomy Diagnosis Code(s):        --- Professional ---                           K80.51, Calculus of bile duct without cholangitis                            or cholecystitis with obstruction                           Z90.49, Acquired absence of other specified parts  of digestive tract                           R10.9, Unspecified abdominal pain                           R74.8, Abnormal levels of other serum enzymes                           R93.2, Abnormal findings on diagnostic imaging of                            liver and biliary tract CPT copyright 2016 American Medical Association. All rights reserved. The codes documented in this report are preliminary and upon coder review may  be revised to meet current compliance requirements. Ladene Artist, MD 05/14/2017 1:38:06 PM This report has been signed electronically. Number of Addenda: 0

## 2017-05-14 NOTE — Progress Notes (Signed)
I agree with the above note, plan.  AFter returning home from this office visit he had RUQ pain, went to ER. LFTs and WBC elevated. MRI shows CBD stone.  Planning ERCP this admission.

## 2017-05-14 NOTE — Transfer of Care (Signed)
Immediate Anesthesia Transfer of Care Note  Patient: Jesse Hoffman  Procedure(s) Performed: ENDOSCOPIC RETROGRADE CHOLANGIOPANCREATOGRAPHY (ERCP) (N/A )  Patient Location: PACU  Anesthesia Type:General  Level of Consciousness: awake, alert  and oriented  Airway & Oxygen Therapy: Patient Spontanous Breathing and Patient connected to face mask oxygen  Post-op Assessment: Report given to RN and Post -op Vital signs reviewed and stable  Post vital signs: Reviewed and stable  Last Vitals:  Vitals:   05/14/17 1024 05/14/17 1225  BP: 96/68 111/63  Pulse: 91 83  Resp: 18 19  Temp: 36.4 C 36.6 C  SpO2: 100% 96%    Last Pain:  Vitals:   05/14/17 1225  TempSrc: Oral  PainSc: 2       Patients Stated Pain Goal: 3 (58/52/77 8242)  Complications: No apparent anesthesia complications

## 2017-05-15 ENCOUNTER — Encounter (HOSPITAL_COMMUNITY): Payer: Self-pay | Admitting: Gastroenterology

## 2017-05-15 DIAGNOSIS — R1011 Right upper quadrant pain: Secondary | ICD-10-CM | POA: Diagnosis present

## 2017-05-15 DIAGNOSIS — D72829 Elevated white blood cell count, unspecified: Secondary | ICD-10-CM

## 2017-05-15 DIAGNOSIS — R945 Abnormal results of liver function studies: Secondary | ICD-10-CM

## 2017-05-15 DIAGNOSIS — K8309 Other cholangitis: Secondary | ICD-10-CM

## 2017-05-15 LAB — COMPREHENSIVE METABOLIC PANEL
ALK PHOS: 218 U/L — AB (ref 38–126)
ALT: 259 U/L — AB (ref 17–63)
ANION GAP: 7 (ref 5–15)
AST: 202 U/L — ABNORMAL HIGH (ref 15–41)
Albumin: 2.5 g/dL — ABNORMAL LOW (ref 3.5–5.0)
BILIRUBIN TOTAL: 5.4 mg/dL — AB (ref 0.3–1.2)
BUN: 47 mg/dL — ABNORMAL HIGH (ref 6–20)
CALCIUM: 7.6 mg/dL — AB (ref 8.9–10.3)
CO2: 20 mmol/L — ABNORMAL LOW (ref 22–32)
CREATININE: 2.05 mg/dL — AB (ref 0.61–1.24)
Chloride: 105 mmol/L (ref 101–111)
GFR, EST AFRICAN AMERICAN: 34 mL/min — AB (ref >60)
GFR, EST NON AFRICAN AMERICAN: 29 mL/min — AB (ref >60)
Glucose, Bld: 284 mg/dL — ABNORMAL HIGH (ref 65–99)
Potassium: 5 mmol/L (ref 3.5–5.1)
Sodium: 132 mmol/L — ABNORMAL LOW (ref 135–145)
TOTAL PROTEIN: 5.5 g/dL — AB (ref 6.5–8.1)

## 2017-05-15 LAB — CBC
HCT: 27.4 % — ABNORMAL LOW (ref 39.0–52.0)
HEMOGLOBIN: 9.6 g/dL — AB (ref 13.0–17.0)
MCH: 31.8 pg (ref 26.0–34.0)
MCHC: 35 g/dL (ref 30.0–36.0)
MCV: 90.7 fL (ref 78.0–100.0)
Platelets: 80 10*3/uL — ABNORMAL LOW (ref 150–400)
RBC: 3.02 MIL/uL — AB (ref 4.22–5.81)
RDW: 13.2 % (ref 11.5–15.5)
WBC: 18.7 10*3/uL — ABNORMAL HIGH (ref 4.0–10.5)

## 2017-05-15 LAB — URINE CULTURE: Culture: NO GROWTH

## 2017-05-15 LAB — GLUCOSE, CAPILLARY: Glucose-Capillary: 270 mg/dL — ABNORMAL HIGH (ref 65–99)

## 2017-05-15 MED ORDER — URSODIOL 300 MG PO CAPS
300.0000 mg | ORAL_CAPSULE | Freq: Every day | ORAL | Status: DC
  Administered 2017-05-15 – 2017-05-17 (×3): 300 mg via ORAL
  Filled 2017-05-15 (×3): qty 1

## 2017-05-15 NOTE — Progress Notes (Signed)
PROGRESS NOTE    Jesse Hoffman  STM:[id number] DOB: 11-11-1935 DOA: 05/13/2017 PCP: Merrilee Seashore, MD  Brief Narrative: ATHEN RIEL is a 81 y.o. male with medical history significant of cholangitis, choledocholithiasis , status post cholecystectomy, status post ERCP and sphincterotomy in 2006 and 2016, diabetes mellitus, hypertension developed severe itching few weeks ago as a result he went to his primary care doctor who noted significantly abnormal liver enzymes, alkaline phosphatase of 237, AST 151, ALT 223 and bilirubin of 2, he was sent to the emergency room where he had a unremarkable CT scan last week and was referred back to gastroenterology. 11/21 afternoon was started having pain and came to ER, admitted, MRCP with Choledocholithiases , same night temp 102, BP dropped to 58mmHg -s/p ERCP and stone extraction 11/22, + Bacteremia on blood cx  Assessment & Plan:   1. Choledocholithiases/ with cholangitis -History of ERCP and sphincterotomy 2 -Febrile to 102 11/21 night, blood Cx with Ecoli and Klebsiella now -Continue IV Zosyn today-Day 2, will repeat Blood cultures for clearance -s/p ERCP/stone extraction 11/22, pus noted  -advance diet -home in 1-2days if repeat cultures negative, LFts and creatinine improving -Cmet in am  2.Ecoli and Klebsiella bacteremia -due to cholangitis from Above, blood Cx drawn 11/21 when he was febrile to 102 -continue IV Zosyn, day 2 -Repeat blood cultures for clearance -Discussed above with Dr. Johnnye Sima infectious disease who agreed with transitioning to oral antibiotics once sensitivities back to complete a 10 day course  3.  AKI on CKD 3 -Creatinine worse than baseline of 1.6 in setting of acute illness, soft BPs yesterday -Holding ARB, HCTZ -continue IVF today -Bmet in am  4. Diabetes mellitus -hold metformin, sliding scale insulin  5. Pancreatic cystic mass -needs FU with Surveillance CT down the road  6.  Hypertension -Hold ARB, monitor  DVT prophylaxis: SCDs for now Code Status: Full code Family Communication: Wife at bedside Disposition Plan: Home in 1-2days    Consultants:   [facility]   Procedures: ERCP with stone extraction 11/22  Antimicrobials:  IV Zosyn 11/22  Subjective: Starting to feel better, no further abdominal pain, some nausea  Objective: Vitals:   05/14/17 1355 05/14/17 1410 05/14/17 2143 05/15/17 0607  BP: 105/65 124/86 121/84 122/81  Pulse: 68 63 75 71  Resp: 15 16 16 16   Temp: 97.6 F (36.4 C) 97.7 F (36.5 C) 97.8 F (36.6 C) (!) 97.5 F (36.4 C)  TempSrc:   Oral Oral  SpO2: 99% 100% 96% 97%  Weight:      Height:        Intake/Output Summary (Last 24 hours) at 05/15/2017 1236 Last data filed at 05/15/2017 1121 Gross per 24 hour  Intake 3228.33 ml  Output 1051 ml  Net 2177.33 ml   Filed Weights   05/13/17 1416  Weight: 78.5 kg (173 lb)    Examination:  Gen: Awake, Alert, Oriented X 3,  HEENT: PERRLA, Neck supple, no JVD Lungs: Good air movement bilaterally, CTAB CVS: RRR,No Gallops,Rubs or new Murmurs Abd: soft, minimal epigastric tenderness, non distended, BS present Extremities: No Cyanosis, Clubbing or edema Skin: no new rashes  Data Reviewed:   CBC: Recent Labs  Lab 05/13/17 1629 05/14/17 0622 05/15/17 0402  WBC 13.9* 12.5* 18.7*  NEUTROABS 12.8*  --   --   HGB 12.2* 9.9* 9.6*  HCT 35.4* 29.0* 27.4*  MCV 89.4 90.1 90.7  PLT 191 121* 80*   Basic Metabolic Panel: Recent Labs  Lab 05/13/17  1629 05/14/17 0622 05/15/17 0402  NA 131* 135 132*  K 4.2 4.1 5.0  CL 97* 104 105  CO2 25 20* 20*  GLUCOSE 215* 240* 284*  BUN 36* 39* 47*  CREATININE 1.91* 2.17* 2.05*  CALCIUM 9.1 8.2* 7.6*   GFR: Estimated Creatinine Clearance: 31.1 mL/min (A) (by C-G formula based on SCr of 2.05 mg/dL (H)). Liver Function Tests: Recent Labs  Lab 05/13/17 1629 05/14/17 0622 05/15/17 0402  AST 450* 305* 202*  ALT 355* 298* 259*    ALKPHOS 401* 302* 218*  BILITOT 4.7* 6.2* 5.4*  PROT 7.3 5.9* 5.5*  ALBUMIN 3.5 2.7* 2.5*   Recent Labs  Lab 05/13/17 1629  LIPASE 60*   No results for input(s): AMMONIA in the last 168 hours. Coagulation Profile: Recent Labs  Lab 05/13/17 1629 05/14/17 0622  INR 1.02 1.22   Cardiac Enzymes: No results for input(s): CKTOTAL, CKMB, CKMBINDEX, TROPONINI in the last 168 hours. BNP (last 3 results) No results for input(s): PROBNP in the last 8760 hours. HbA1C: No results for input(s): HGBA1C in the last 72 hours. CBG: Recent Labs  Lab 05/14/17 1240 05/14/17 1343  GLUCAP 270* 262*   Lipid Profile: No results for input(s): CHOL, HDL, LDLCALC, TRIG, CHOLHDL, LDLDIRECT in the last 72 hours. Thyroid Function Tests: No results for input(s): TSH, T4TOTAL, FREET4, T3FREE, THYROIDAB in the last 72 hours. Anemia Panel: No results for input(s): VITAMINB12, FOLATE, FERRITIN, TIBC, IRON, RETICCTPCT in the last 72 hours. Urine analysis:    Component Value Date/Time   COLORURINE AMBER (A) 05/13/2017 1850   APPEARANCEUR HAZY (A) 05/13/2017 1850   LABSPEC 1.014 05/13/2017 1850   PHURINE 5.0 05/13/2017 1850   GLUCOSEU NEGATIVE 05/13/2017 1850   HGBUR NEGATIVE 05/13/2017 1850   BILIRUBINUR NEGATIVE 05/13/2017 1850   KETONESUR NEGATIVE 05/13/2017 1850   PROTEINUR NEGATIVE 05/13/2017 1850   UROBILINOGEN 1.0 02/01/2015 2147   NITRITE NEGATIVE 05/13/2017 1850   LEUKOCYTESUR NEGATIVE 05/13/2017 1850   Sepsis Labs: @LABRCNTIP (procalcitonin:4,lacticidven:4)  ) Recent Results (from the past 240 hour(s))  Urine culture     Status: None   Collection Time: 05/13/17  6:50 PM  Result Value Ref Range Status   Specimen Description URINE, CLEAN CATCH  Final   Special Requests NONE  Final   Culture   Final    NO GROWTH Performed at [facility], [place] [address], [place], [place] [address]    Report Status 05/15/2017 FINAL  Final  Culture, blood (routine x 2)     Status: Abnormal  (Preliminary result)   Collection Time: 05/13/17 11:02 PM  Result Value Ref Range Status   Specimen Description BLOOD LEFT ANTECUBITAL  Final   Special Requests   Final    BOTTLES DRAWN AEROBIC AND ANAEROBIC Blood Culture adequate volume   Culture  Setup Time   Final    GRAM NEGATIVE RODS IN BOTH AEROBIC AND ANAEROBIC BOTTLES CRITICAL RESULT CALLED TO, READ BACK BY AND VERIFIED WITH: J LEGGE,PHARMD AT 4782 05/14/17 BY L BENFIELD    Culture (A)  Final    ESCHERICHIA COLI KLEBSIELLA PNEUMONIAE CULTURE REINCUBATED FOR BETTER GROWTH Performed at [facility], [place] [address], [place], [place] [address]    Report Status PENDING  Incomplete  Blood Culture ID Panel (Reflexed)     Status: Abnormal   Collection Time: 05/13/17 11:02 PM  Result Value Ref Range Status   Enterococcus species NOT DETECTED NOT DETECTED Final   Listeria monocytogenes NOT DETECTED NOT DETECTED Final  Staphylococcus species NOT DETECTED NOT DETECTED Final   Staphylococcus aureus NOT DETECTED NOT DETECTED Final   Streptococcus species NOT DETECTED NOT DETECTED Final   Streptococcus agalactiae NOT DETECTED NOT DETECTED Final   Streptococcus pneumoniae NOT DETECTED NOT DETECTED Final   Streptococcus pyogenes NOT DETECTED NOT DETECTED Final   Acinetobacter baumannii NOT DETECTED NOT DETECTED Final   Enterobacteriaceae species DETECTED (A) NOT DETECTED Final    Comment: CRITICAL RESULT CALLED TO, READ BACK BY AND VERIFIED WITH: J LEGGE,PHARMD AT 1555 05/14/17 BY L BENFIELD    Enterobacter cloacae complex NOT DETECTED NOT DETECTED Final   Escherichia coli DETECTED (A) NOT DETECTED Final    Comment: CRITICAL RESULT CALLED TO, READ BACK BY AND VERIFIED WITH: J LEGGE,PHARMD AT 1555 05/14/17 BY L BENFIELD    Klebsiella oxytoca NOT DETECTED NOT DETECTED Final   Klebsiella pneumoniae DETECTED (A) NOT DETECTED Final    Comment: CRITICAL RESULT CALLED TO, READ BACK BY AND VERIFIED WITH: J LEGGE,PHARMD AT 1555  05/14/17 BY L BENFIELD    Proteus species NOT DETECTED NOT DETECTED Final   Serratia marcescens NOT DETECTED NOT DETECTED Final   Carbapenem resistance NOT DETECTED NOT DETECTED Final   Haemophilus influenzae NOT DETECTED NOT DETECTED Final   Neisseria meningitidis NOT DETECTED NOT DETECTED Final   Pseudomonas aeruginosa NOT DETECTED NOT DETECTED Final   Candida albicans NOT DETECTED NOT DETECTED Final   Candida glabrata NOT DETECTED NOT DETECTED Final   Candida krusei NOT DETECTED NOT DETECTED Final   Candida parapsilosis NOT DETECTED NOT DETECTED Final   Candida tropicalis NOT DETECTED NOT DETECTED Final    Comment: Performed at [facility], [place]. [address], [place], [place] [address]  Culture, blood (routine x 2)     Status: None (Preliminary result)   Collection Time: 05/13/17 11:10 PM  Result Value Ref Range Status   Specimen Description BLOOD BLOOD LEFT HAND  Final   Special Requests   Final    BOTTLES DRAWN AEROBIC AND ANAEROBIC Blood Culture adequate volume   Culture  Setup Time   Final    GRAM NEGATIVE RODS IN BOTH AEROBIC AND ANAEROBIC BOTTLES CRITICAL RESULT CALLED TO, READ BACK BY AND VERIFIED WITH: J LEGGE,PHARMD AT 1555 05/14/17 BY L BENFIELD    Culture   Final    GRAM NEGATIVE RODS KLEBSIELLA PNEUMONIAE IDENTIFICATION AND SUSCEPTIBILITIES TO FOLLOW Performed at [facility], [place] [address], [place], [place] [address]    Report Status PENDING  Incomplete  Culture, blood (routine x 2)     Status: None (Preliminary result)   Collection Time: 05/15/17  9:53 AM  Result Value Ref Range Status   Specimen Description BLOOD RIGHT ANTECUBITAL  Final   Special Requests   Final    IN PEDIATRIC BOTTLE Blood Culture adequate volume Performed at [place] [address], [place], [place] [address]    Culture PENDING  Incomplete   Report Status PENDING  Incomplete  Culture, blood (routine x 2)     Status: None (Preliminary result)   Collection Time:  05/15/17  9:54 AM  Result Value Ref Range Status   Specimen Description BLOOD RIGHT FOREARM  Final   Special Requests   Final    BOTTLES DRAWN AEROBIC ONLY Blood Culture adequate volume Performed at [facility], [place] [address] [place], [place] [address]    Culture PENDING  Incomplete   Report Status PENDING  Incomplete  Radiology Studies: Mr Abdomen Mrcp Wo Contrast  Result Date: 05/13/2017 CLINICAL DATA:  Abdominal pain. Elevated liver function tests. Biliary dilatation seen on recent CT. Prior cholecystectomy. Renal insufficiency. EXAM: MRI ABDOMEN WITHOUT CONTRAST  (INCLUDING MRCP) TECHNIQUE: Multiplanar multisequence MR imaging of the abdomen was performed. Heavily T2-weighted images of the biliary and pancreatic ducts were obtained, and three-dimensional MRCP images were rendered by post processing. COMPARISON:  CT on 05/08/2017 FINDINGS: Lower chest: No acute findings. Hepatobiliary: No masses visualized on this unenhanced exam. Prior cholecystectomy. Diffuse biliary ductal dilatation measuring 14 mm. A calculus measuring approximately 1.4 cm is seen in the distal common bile duct. Pancreas: A 12 mm simple appearing cyst is seen within the pancreatic neck on image 28/8, with possible communication with main pancreatic duct. There is no evidence of pancreatic ductal dilatation or pancreas divisum. No other pancreatic lesions are identified. Spleen:  Within normal limits in size. Adrenals/Urinary tract: Several Bosniak category 1 and 2 cysts are seen in both kidneys. In addition, there are 2 sub-cm lesions in the anterior lower pole of the left kidney which shows T1 and T2 hypointensity. These cannot be characterized without IV contrast, and small low-grade papillary renal cell carcinomas cannot be excluded. No evidence of hydronephrosis. Stomach/Bowel: No evidence of obstruction, inflammatory process, or abnormal fluid collections. Vascular/Lymphatic: No pathologically enlarged  lymph nodes identified. No evidence of abdominal aortic aneurysm. Other:  None. Musculoskeletal:  No suspicious bone lesions identified. IMPRESSION: Prior cholecystectomy. Diffuse biliary ductal dilatation, with large calculus in distal common bile duct. 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 Recommend continued followup by abdomen MRI in 2 years. This recommendation follows ACR consensus guidelines: Management of Incidental Pancreatic Cysts: A White Paper of the ACR Incidental Findings Committee. [place] 8657;84:696-295. Two sub-cm indeterminate lesions in lower pole of left kidney which cannot be characterized without IV contrast. Small low-grade papillary renal cell carcinomas cannot be excluded. Recommend continued followup by abdomen MRI in 1 year. This recommendation follows ACR consensus guidelines: Management of the Incidental Renal Mass on CT: A White Paper of the ACR Incidental Findings Committee. J Am Coll Radiol [phone]. Electronically Signed   By: Earle Gell M.D.   On: 05/13/2017 20:47   Mr 3d Recon At Scanner  Result Date: 05/13/2017 CLINICAL DATA:  Abdominal pain. Elevated liver function tests. Biliary dilatation seen on recent CT. Prior cholecystectomy. Renal insufficiency. EXAM: MRI ABDOMEN WITHOUT CONTRAST  (INCLUDING MRCP) TECHNIQUE: Multiplanar multisequence MR imaging of the abdomen was performed. Heavily T2-weighted images of the biliary and pancreatic ducts were obtained, and three-dimensional MRCP images were rendered by post processing. COMPARISON:  CT on 05/08/2017 FINDINGS: Lower chest: No acute findings. Hepatobiliary: No masses visualized on this unenhanced exam. Prior cholecystectomy. Diffuse biliary ductal dilatation measuring 14 mm. A calculus measuring approximately 1.4 cm is seen in the distal common bile duct. Pancreas: A 12 mm simple appearing cyst is seen within the pancreatic  neck on image 28/8, with possible communication with main pancreatic duct. There is no evidence of pancreatic ductal dilatation or pancreas divisum. No other pancreatic lesions are identified. Spleen:  Within normal limits in size. Adrenals/Urinary tract: Several Bosniak category 1 and 2 cysts are seen in both kidneys. In addition, there are 2 sub-cm lesions in the anterior lower pole of the left kidney which shows T1 and T2 hypointensity. These cannot be characterized without IV contrast, and small low-grade papillary renal cell carcinomas cannot  be excluded. No evidence of hydronephrosis. Stomach/Bowel: No evidence of obstruction, inflammatory process, or abnormal fluid collections. Vascular/Lymphatic: No pathologically enlarged lymph nodes identified. No evidence of abdominal aortic aneurysm. Other:  None. Musculoskeletal:  No suspicious bone lesions identified. IMPRESSION: Prior cholecystectomy. Diffuse biliary ductal dilatation, with large calculus in distal common bile duct. 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 Recommend continued followup by abdomen MRI in 2 years. This recommendation follows ACR consensus guidelines: Management of Incidental Pancreatic Cysts: A White Paper of the ACR Incidental Findings Committee. [place] 0350;09:381-829. Two sub-cm indeterminate lesions in lower pole of left kidney which cannot be characterized without IV contrast. Small low-grade papillary renal cell carcinomas cannot be excluded. Recommend continued followup by abdomen MRI in 1 year. This recommendation follows ACR consensus guidelines: Management of the Incidental Renal Mass on CT: A White Paper of the ACR Incidental Findings Committee. J Am Coll Radiol [phone]. Electronically Signed   By: Earle Gell M.D.   On: 05/13/2017 20:47   Dg Chest Port 1 View  Result Date: 05/13/2017 CLINICAL DATA:  Sudden onset fever EXAM:  PORTABLE CHEST 1 VIEW COMPARISON:  02/06/2017 FINDINGS: Shallow inspiration with atelectasis in the lung bases. Heart size and pulmonary vascularity are normal for technique. No focal consolidation. No blunting of costophrenic angles. No pneumothorax. Calcification of the aorta. IMPRESSION: Shallow inspiration with atelectasis in the lung bases. Aortic atherosclerosis. Electronically Signed   By: Lucienne Capers M.D.   On: 05/13/2017 23:29   Dg Ercp Biliary & Pancreatic Ducts  Result Date: 05/14/2017 CLINICAL DATA:  Balloon stone or EXAM: ERCP TECHNIQUE: Multiple spot images obtained with the fluoroscopic device and submitted for interpretation post-procedure. FLUOROSCOPY TIME:  Fluoroscopy Time:  3 minutes and 14 seconds Radiation Exposure Index (if provided by the fluoroscopic device): Number of Acquired Spot Images: 0 COMPARISON:  None. FINDINGS: Imaging demonstrates cannulation of the common bile duct. Contrast fills the There is a large filling defect in the distal common bile duct. Balloon stone retrieval imaging is documented. IMPRESSION: See above. These images were submitted for radiologic interpretation only. Please see the procedural report for the amount of contrast and the fluoroscopy time utilized. Electronically Signed   By: Marybelle Killings M.D.   On: 05/14/2017 14:11        Scheduled Meds: . atorvastatin  40 mg Oral q1800  . multivitamin with minerals  1 tablet Oral Daily  . pantoprazole  40 mg Oral Daily  . ursodiol  300 mg Oral Daily   Continuous Infusions: . sodium chloride 100 mL/hr at 05/15/17 0114  . piperacillin-tazobactam (ZOSYN)  IV 3.375 g (05/15/17 1015)     LOS: 2 days    Time spent: 46min    Domenic Polite, MD [facility] Page via [email or web address], password [email or web address] After 7PM please contact night-coverage  05/15/2017, 12:36 PM

## 2017-05-15 NOTE — Progress Notes (Signed)
Progress Note   Subjective  Chief Complaint: Choledocholithiasis with cholangitis  Today, the patient is found with his wife and daughter, he explains that he did well overnight.  He has been holding off on breakfast and hopes that we will advance his diet before he eats this.  He denies any abdominal pain, he had 3 soft bowel movements yesterday and has experienced no nausea.   Objective   Vital signs in last 24 hours: Temp:  [97.5 F (36.4 C)-97.8 F (36.6 C)] 97.5 F (36.4 C) (11/23 0607) Pulse Rate:  [63-92] 71 (11/23 0607) Resp:  [13-19] 16 (11/23 0607) BP: (81-124)/(48-86) 122/81 (11/23 0607) SpO2:  [96 %-100 %] 97 % (11/23 0607) Last BM Date: 05/14/17 General:   Caucasian male in NAD Heart:  Regular rate and rhythm; no murmurs Lungs: Respirations even and unlabored, lungs CTA bilaterally Abdomen:  Soft, mild epigastric ttp and nondistended. Normal bowel sounds. Extremities:  Without edema. Neurologic:  Alert and oriented,  grossly normal neurologically. Psych:  Cooperative. Normal mood and affect.  Intake/Output from previous day: 11/22 0701 - 11/23 0700 In: 2678.3 [P.O.:1140; I.V.:1438.3; IV Piggyback:100] Out: 801 [Urine:800; Stool:1] Intake/Output this shift: Total I/O In: 120 [P.O.:120] Out: 250 [Urine:250]  Lab Results: Recent Labs    05/13/17 1629 05/14/17 0622 05/15/17 0402  WBC 13.9* 12.5* 18.7*  HGB 12.2* 9.9* 9.6*  HCT 35.4* 29.0* 27.4*  PLT 191 121* 80*   BMET Recent Labs    05/13/17 1629 05/14/17 0622 05/15/17 0402  NA 131* 135 132*  K 4.2 4.1 5.0  CL 97* 104 105  CO2 25 20* 20*  GLUCOSE 215* 240* 284*  BUN 36* 39* 47*  CREATININE 1.91* 2.17* 2.05*  CALCIUM 9.1 8.2* 7.6*   LFT Recent Labs    05/15/17 0402  PROT 5.5*  ALBUMIN 2.5*  AST 202*  ALT 259*  ALKPHOS 218*  BILITOT 5.4*   PT/INR Recent Labs    05/13/17 1629 05/14/17 0622  LABPROT 13.3 15.3*  INR 1.02 1.22    Studies/Results: Mr Abdomen Mrcp Wo  Contrast  Result Date: 05/13/2017 CLINICAL DATA:  Abdominal pain. Elevated liver function tests. Biliary dilatation seen on recent CT. Prior cholecystectomy. Renal insufficiency. EXAM: MRI ABDOMEN WITHOUT CONTRAST  (INCLUDING MRCP) TECHNIQUE: Multiplanar multisequence MR imaging of the abdomen was performed. Heavily T2-weighted images of the biliary and pancreatic ducts were obtained, and three-dimensional MRCP images were rendered by post processing. COMPARISON:  CT on 05/08/2017 FINDINGS: Lower chest: No acute findings. Hepatobiliary: No masses visualized on this unenhanced exam. Prior cholecystectomy. Diffuse biliary ductal dilatation measuring 14 mm. A calculus measuring approximately 1.4 cm is seen in the distal common bile duct. Pancreas: A 12 mm simple appearing cyst is seen within the pancreatic neck on image 28/8, with possible communication with main pancreatic duct. There is no evidence of pancreatic ductal dilatation or pancreas divisum. No other pancreatic lesions are identified. Spleen:  Within normal limits in size. Adrenals/Urinary tract: Several Bosniak category 1 and 2 cysts are seen in both kidneys. In addition, there are 2 sub-cm lesions in the anterior lower pole of the left kidney which shows T1 and T2 hypointensity. These cannot be characterized without IV contrast, and small low-grade papillary renal cell carcinomas cannot be excluded. No evidence of hydronephrosis. Stomach/Bowel: No evidence of obstruction, inflammatory process, or abnormal fluid collections. Vascular/Lymphatic: No pathologically enlarged lymph nodes identified. No evidence of abdominal aortic aneurysm. Other:  None. Musculoskeletal:  No suspicious bone lesions identified. IMPRESSION: Prior  cholecystectomy. Diffuse biliary ductal dilatation, with large calculus in distal common bile duct. 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 Recommend  continued followup by abdomen MRI in 2 years. This recommendation follows ACR consensus guidelines: Management of Incidental Pancreatic Cysts: A White Paper of the ACR Incidental Findings Committee. [place] 0102;72:536-644. Two sub-cm indeterminate lesions in lower pole of left kidney which cannot be characterized without IV contrast. Small low-grade papillary renal cell carcinomas cannot be excluded. Recommend continued followup by abdomen MRI in 1 year. This recommendation follows ACR consensus guidelines: Management of the Incidental Renal Mass on CT: A White Paper of the ACR Incidental Findings Committee. J Am Coll Radiol [phone]. Electronically Signed   By: Earle Gell M.D.   On: 05/13/2017 20:47   Mr 3d Recon At Scanner  Result Date: 05/13/2017 CLINICAL DATA:  Abdominal pain. Elevated liver function tests. Biliary dilatation seen on recent CT. Prior cholecystectomy. Renal insufficiency. EXAM: MRI ABDOMEN WITHOUT CONTRAST  (INCLUDING MRCP) TECHNIQUE: Multiplanar multisequence MR imaging of the abdomen was performed. Heavily T2-weighted images of the biliary and pancreatic ducts were obtained, and three-dimensional MRCP images were rendered by post processing. COMPARISON:  CT on 05/08/2017 FINDINGS: Lower chest: No acute findings. Hepatobiliary: No masses visualized on this unenhanced exam. Prior cholecystectomy. Diffuse biliary ductal dilatation measuring 14 mm. A calculus measuring approximately 1.4 cm is seen in the distal common bile duct. Pancreas: A 12 mm simple appearing cyst is seen within the pancreatic neck on image 28/8, with possible communication with main pancreatic duct. There is no evidence of pancreatic ductal dilatation or pancreas divisum. No other pancreatic lesions are identified. Spleen:  Within normal limits in size. Adrenals/Urinary tract: Several Bosniak category 1 and 2 cysts are seen in both kidneys. In addition, there are 2 sub-cm lesions in the anterior lower  pole of the left kidney which shows T1 and T2 hypointensity. These cannot be characterized without IV contrast, and small low-grade papillary renal cell carcinomas cannot be excluded. No evidence of hydronephrosis. Stomach/Bowel: No evidence of obstruction, inflammatory process, or abnormal fluid collections. Vascular/Lymphatic: No pathologically enlarged lymph nodes identified. No evidence of abdominal aortic aneurysm. Other:  None. Musculoskeletal:  No suspicious bone lesions identified. IMPRESSION: Prior cholecystectomy. Diffuse biliary ductal dilatation, with large calculus in distal common bile duct. 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 Recommend continued followup by abdomen MRI in 2 years. This recommendation follows ACR consensus guidelines: Management of Incidental Pancreatic Cysts: A White Paper of the ACR Incidental Findings Committee. [place] 7564;33:295-188. Two sub-cm indeterminate lesions in lower pole of left kidney which cannot be characterized without IV contrast. Small low-grade papillary renal cell carcinomas cannot be excluded. Recommend continued followup by abdomen MRI in 1 year. This recommendation follows ACR consensus guidelines: Management of the Incidental Renal Mass on CT: A White Paper of the ACR Incidental Findings Committee. J Am Coll Radiol [phone]. Electronically Signed   By: Earle Gell M.D.   On: 05/13/2017 20:47   Dg Chest Port 1 View  Result Date: 05/13/2017 CLINICAL DATA:  Sudden onset fever EXAM: PORTABLE CHEST 1 VIEW COMPARISON:  02/06/2017 FINDINGS: Shallow inspiration with atelectasis in the lung bases. Heart size and pulmonary vascularity are normal for technique. No focal consolidation. No blunting of costophrenic angles. No pneumothorax. Calcification of the aorta. IMPRESSION: Shallow inspiration with atelectasis in the lung bases. Aortic atherosclerosis.  Electronically  Signed   By: Lucienne Capers M.D.   On: 05/13/2017 23:29   Dg Ercp Biliary & Pancreatic Ducts  Result Date: 05/14/2017 CLINICAL DATA:  Balloon stone or EXAM: ERCP TECHNIQUE: Multiple spot images obtained with the fluoroscopic device and submitted for interpretation post-procedure. FLUOROSCOPY TIME:  Fluoroscopy Time:  3 minutes and 14 seconds Radiation Exposure Index (if provided by the fluoroscopic device): Number of Acquired Spot Images: 0 COMPARISON:  None. FINDINGS: Imaging demonstrates cannulation of the common bile duct. Contrast fills the There is a large filling defect in the distal common bile duct. Balloon stone retrieval imaging is documented. IMPRESSION: See above. These images were submitted for radiologic interpretation only. Please see the procedural report for the amount of contrast and the fluoroscopy time utilized. Electronically Signed   By: Marybelle Killings M.D.   On: 05/14/2017 14:11   ERCP 05/14/17-Dr. Fuller Plan: Impression: Filling defects consistent with a stone and sludge were seen on the cholangiogram, dilated common bile duct with a stone causing obstruction, large periampullary diverticulum, prior cholecystectomy, prior small sphincterotomy, choledocholithiasis, sludge was found.  Complete stone and sludge removal were accomplished by biliary sphincterotomy and balloon extraction.  An extended biliary sphincterotomy was performed.  Cholangitis.  The biliary tree was swept and pus was found. Recommendations: Avoid aspirin and NSAIDs for 1 week, return to hospital ward for ongoing care, continue IV antibiotics for cholangitis for now incompletely 70 repeat LFTs tomorrow and repeat as outpatient in 1 month, consider ongoing treatment with ursodiol, follow with Dr. Ardis Hughs in 1 month   Assessment / Plan:   Assessment: 1.  Choledocholithiasis/cholangitis/leukocytosis/LFTs: Patient underwent ERCP yesterday with findings as above  Plan: 1.  Advanced the patient to a full liquid diet this  morning.  Discussed that if he does well with this he can advance to a soft diet for lunch. 2.  Discussed possible discharge today pending how well the patient does with diet, he and his wife are hesitant and would like to stay at least another day. 3.  Patient will need a total of 7 days of antibiotics per Dr. Fuller Plan, can transition to Cipro/Flagyl if patient tolerates 4.  Patient will need follow-up with Dr. Ardis Hughs in 1 month as well as repeat LFTs, will arrange 5.  Started patient on Ursodiol 300 mg today, discussed this medication with the patient and his family 6.  Please await any final recommendations from Dr. Silverio Decamp later today   Discharge Planning Diet: full liquid breakfast, soft for lunch and then advance as tolerated Anticoagulation and antiplatelets:Avoid NSAIDs and ASA x1 week Discharge Medications: Cipro/Flagyl for total of 7 days of abx per Dr. Fuller Plan, also will need Ursodiol 300mg  qd #30 with a refill Follow up: Dr. Ardis Hughs in 1 mos Lab work ordered: Repeat LFT 1 month  Thank you for your kind consultation, we will continue to follow along while the patient is hospitalized.   LOS: 2 days   Levin Erp  05/15/2017, 11:10 AM  Pager # [phone]   Attending physician's note   I have taken an interval history, reviewed the chart and examined the patient. I agree with the Advanced Practitioner's note, impression and recommendations.  LFTs trending down.  Status post ERCP yesterday. Advance diet as tolerated OK to transition to oral antibiotics tomorrow and discharge home from GI perspective Complete 7 day course of antibiotics total. Start Ursodiol 300 mg daily, will consider titrating up the dose to 900 mg daily if able to tolerate as outpatient  Follow up with our office in next 3-4weeks Please call with any questions  K Denzil Magnuson, MD [phone] Mon-Fri 8a-5p [phone] after 5p, weekends, holidays

## 2017-05-16 LAB — CULTURE, BLOOD (ROUTINE X 2)
Special Requests: ADEQUATE
Special Requests: ADEQUATE

## 2017-05-16 LAB — COMPREHENSIVE METABOLIC PANEL
ALBUMIN: 2.3 g/dL — AB (ref 3.5–5.0)
ALK PHOS: 216 U/L — AB (ref 38–126)
ALT: 187 U/L — AB (ref 17–63)
ALT: 171 U/L — ABNORMAL HIGH (ref 17–63)
ANION GAP: 5 (ref 5–15)
AST: 85 U/L — ABNORMAL HIGH (ref 15–41)
AST: 73 U/L — ABNORMAL HIGH (ref 15–41)
Albumin: 2.4 g/dL — ABNORMAL LOW (ref 3.5–5.0)
Alkaline Phosphatase: 208 U/L — ABNORMAL HIGH (ref 38–126)
Anion gap: 7 (ref 5–15)
BUN: 37 mg/dL — ABNORMAL HIGH (ref 6–20)
BUN: 33 mg/dL — ABNORMAL HIGH (ref 6–20)
CALCIUM: 7.8 mg/dL — AB (ref 8.9–10.3)
CHLORIDE: 108 mmol/L (ref 101–111)
CHLORIDE: 109 mmol/L (ref 101–111)
CO2: 21 mmol/L — ABNORMAL LOW (ref 22–32)
CO2: 20 mmol/L — AB (ref 22–32)
CREATININE: 1.72 mg/dL — AB (ref 0.61–1.24)
Calcium: 7.7 mg/dL — ABNORMAL LOW (ref 8.9–10.3)
Creatinine, Ser: 1.67 mg/dL — ABNORMAL HIGH (ref 0.61–1.24)
GFR calc Af Amer: 43 mL/min — ABNORMAL LOW (ref >60)
GFR calc non Af Amer: 37 mL/min — ABNORMAL LOW (ref >60)
GFR, EST AFRICAN AMERICAN: 42 mL/min — AB (ref >60)
GFR, EST NON AFRICAN AMERICAN: 36 mL/min — AB (ref >60)
GLUCOSE: 358 mg/dL — AB (ref 65–99)
Glucose, Bld: 320 mg/dL — ABNORMAL HIGH (ref 65–99)
POTASSIUM: 4.9 mmol/L (ref 3.5–5.1)
Potassium: 4.6 mmol/L (ref 3.5–5.1)
SODIUM: 134 mmol/L — AB (ref 135–145)
Sodium: 136 mmol/L (ref 135–145)
TOTAL PROTEIN: 5.4 g/dL — AB (ref 6.5–8.1)
Total Bilirubin: 1.9 mg/dL — ABNORMAL HIGH (ref 0.3–1.2)
Total Bilirubin: 1.8 mg/dL — ABNORMAL HIGH (ref 0.3–1.2)
Total Protein: 5.3 g/dL — ABNORMAL LOW (ref 6.5–8.1)

## 2017-05-16 LAB — CBC
HCT: 27.9 % — ABNORMAL LOW (ref 39.0–52.0)
Hemoglobin: 9.5 g/dL — ABNORMAL LOW (ref 13.0–17.0)
MCH: 30.9 pg (ref 26.0–34.0)
MCHC: 34.1 g/dL (ref 30.0–36.0)
MCV: 90.9 fL (ref 78.0–100.0)
PLATELETS: 110 10*3/uL — AB (ref 150–400)
RBC: 3.07 MIL/uL — AB (ref 4.22–5.81)
RDW: 13.3 % (ref 11.5–15.5)
WBC: 15 10*3/uL — ABNORMAL HIGH (ref 4.0–10.5)

## 2017-05-16 MED ORDER — METRONIDAZOLE 500 MG PO TABS
500.0000 mg | ORAL_TABLET | Freq: Three times a day (TID) | ORAL | Status: DC
  Administered 2017-05-16 – 2017-05-17 (×4): 500 mg via ORAL
  Filled 2017-05-16 (×4): qty 1

## 2017-05-16 MED ORDER — DEXTROSE 5 % IV SOLN
2.0000 g | INTRAVENOUS | Status: DC
  Administered 2017-05-16: 17:00:00 2 g via INTRAVENOUS
  Filled 2017-05-16: qty 2

## 2017-05-16 NOTE — Progress Notes (Signed)
Patient Demographics:    Jesse Hoffman, is a 81 y.o. male, DOB - [birth year], ZWC:[id number]  Admit date - 05/13/2017   Admitting Physician Domenic Polite, MD  Outpatient Primary MD for the patient is Merrilee Seashore, MD  LOS - 3   Chief Complaint  Patient presents with  . Abdominal Pain        Subjective:    Jesse Hoffman today has no fevers, no emesis,  No chest pain, tolerating GI soft diet well, wife at bedside, questions answered  Assessment  & Plan :    Active Problems:   Elevated liver enzymes   Diabetes mellitus without complication (HCC)   Abdominal pain   Choledocholithiasis   CKD stage 3 due to type 1 diabetes mellitus (HCC)   Elevated LFTs   Ascending cholangitis   Abdominal pain, RUQ   1)Choledocholithiases/ with cholangitis with E coli and Klebsiella bacteremia-stop IV Zosyn, treat empirically with IV Rocephin based on culture sensitivity results, and Flagyl for anaerobic coverage.  GI input appreciated, repeat blood cultures from 05/15/2017 pending  2) Pancreatic cystic mass- -needs FU with Surveillance CT down the road  3)DM-metformin on hold,Use Novolog/Humalog Sliding scale insulin with Accu-Cheks/Fingersticks as ordered    Code Status : full   Disposition Plan  : home in 1 to 2 days   Consults  :  Gi   DVT Prophylaxis  :   SCDs   Lab Results  Component Value Date   PLT 110 (L) 05/16/2017    Inpatient Medications  Scheduled Meds: . atorvastatin  40 mg Oral q1800  . metroNIDAZOLE  500 mg Oral Q8H  . multivitamin with minerals  1 tablet Oral Daily  . pantoprazole  40 mg Oral Daily  . ursodiol  300 mg Oral Daily   Continuous Infusions: . cefTRIAXone (ROCEPHIN)  IV 2 g (05/16/17 1639)   PRN Meds:.hydrOXYzine, LORazepam, morphine injection, ondansetron (ZOFRAN) IV    Anti-infectives (From admission, onward)   Start     Dose/Rate Route  Frequency Ordered Stop   05/16/17 1600  cefTRIAXone (ROCEPHIN) 2 g in dextrose 5 % 50 mL IVPB     2 g 100 mL/hr over 30 Minutes Intravenous Every 24 hours 05/16/17 1119     05/16/17 1400  metroNIDAZOLE (FLAGYL) tablet 500 mg     500 mg Oral Every 8 hours 05/16/17 1136     05/14/17 1315  ampicillin-sulbactam (UNASYN) 1.5 g in sodium chloride 0.9 % 50 mL IVPB  Status:  Discontinued     1.5 g 100 mL/hr over 30 Minutes Intravenous  Once 05/14/17 1310 05/14/17 1428   05/14/17 1030  piperacillin-tazobactam (ZOSYN) IVPB 3.375 g  Status:  Discontinued     3.375 g 12.5 mL/hr over 240 Minutes Intravenous Every 8 hours 05/14/17 1012 05/16/17 1128        Objective:   Vitals:   05/15/17 1430 05/15/17 2103 05/16/17 0521 05/16/17 1447  BP: 112/71 121/75 138/84 120/78  Pulse: 88 80 83 78  Resp: 18 17 16 17   Temp: 98.2 F (36.8 C) 98.2 F (36.8 C) 98 F (36.7 C) 98.2 F (36.8 C)  TempSrc: Oral Oral Oral Oral  SpO2: 97% 97% 95% 96%  Weight:      Height:  Wt Readings from Last 3 Encounters:  05/13/17 78.5 kg (173 lb)  05/13/17 78.7 kg (173 lb 9.6 oz)  05/08/17 78.9 kg (174 lb)     Intake/Output Summary (Last 24 hours) at 05/16/2017 1853 Last data filed at 05/16/2017 1839 Gross per 24 hour  Intake 2172.5 ml  Output 900 ml  Net 1272.5 ml     Physical Exam  Gen:- Awake Alert,  In no apparent distress  HEENT:- [place].AT, No sclera icterus Neck-Supple Neck,No JVD,.  Lungs-  CTAB  CV- S1, S2 normal Abd-  +ve B.Sounds, Abd Soft, much less tender Extremity/Skin:- No  edema,       Data Review:   Micro Results Recent Results (from the past 240 hour(s))  Urine culture     Status: None   Collection Time: 05/13/17  6:50 PM  Result Value Ref Range Status   Specimen Description URINE, CLEAN CATCH  Final   Special Requests NONE  Final   Culture   Final    NO GROWTH Performed at [facility], [place] [address], [place], [place] [address]    Report Status 05/15/2017 FINAL   Final  Culture, blood (routine x 2)     Status: Abnormal   Collection Time: 05/13/17 11:02 PM  Result Value Ref Range Status   Specimen Description BLOOD LEFT ANTECUBITAL  Final   Special Requests   Final    BOTTLES DRAWN AEROBIC AND ANAEROBIC Blood Culture adequate volume   Culture  Setup Time   Final    GRAM NEGATIVE RODS IN BOTH AEROBIC AND ANAEROBIC BOTTLES CRITICAL RESULT CALLED TO, READ BACK BY AND VERIFIED WITH: J LEGGE,PHARMD AT 1062 05/14/17 BY L BENFIELD    Culture (A)  Final    ESCHERICHIA COLI KLEBSIELLA PNEUMONIAE SUSCEPTIBILITIES PERFORMED ON PREVIOUS CULTURE WITHIN THE LAST 5 DAYS. Performed at [facility], [place] [address] [place], [place] [address]    Report Status 05/16/2017 FINAL  Final  Blood Culture ID Panel (Reflexed)     Status: Abnormal   Collection Time: 05/13/17 11:02 PM  Result Value Ref Range Status   Enterococcus species NOT DETECTED NOT DETECTED Final   Listeria monocytogenes NOT DETECTED NOT DETECTED Final   Staphylococcus species NOT DETECTED NOT DETECTED Final   Staphylococcus aureus NOT DETECTED NOT DETECTED Final   Streptococcus species NOT DETECTED NOT DETECTED Final   Streptococcus agalactiae NOT DETECTED NOT DETECTED Final   Streptococcus pneumoniae NOT DETECTED NOT DETECTED Final   Streptococcus pyogenes NOT DETECTED NOT DETECTED Final   Acinetobacter baumannii NOT DETECTED NOT DETECTED Final   Enterobacteriaceae species DETECTED (A) NOT DETECTED Final    Comment: CRITICAL RESULT CALLED TO, READ BACK BY AND VERIFIED WITH: J LEGGE,PHARMD AT 1555 05/14/17 BY L BENFIELD    Enterobacter cloacae complex NOT DETECTED NOT DETECTED Final   Escherichia coli DETECTED (A) NOT DETECTED Final    Comment: CRITICAL RESULT CALLED TO, READ BACK BY AND VERIFIED WITH: J LEGGE,PHARMD AT 1555 05/14/17 BY L BENFIELD    Klebsiella oxytoca NOT DETECTED NOT DETECTED Final   Klebsiella pneumoniae DETECTED (A) NOT DETECTED Final    Comment: CRITICAL RESULT  CALLED TO, READ BACK BY AND VERIFIED WITH: J LEGGE,PHARMD AT 1555 05/14/17 BY L BENFIELD    Proteus species NOT DETECTED NOT DETECTED Final   Serratia marcescens NOT DETECTED NOT DETECTED Final   Carbapenem resistance NOT DETECTED NOT DETECTED Final   Haemophilus influenzae NOT DETECTED NOT DETECTED Final   Neisseria meningitidis NOT DETECTED  NOT DETECTED Final   Pseudomonas aeruginosa NOT DETECTED NOT DETECTED Final   Candida albicans NOT DETECTED NOT DETECTED Final   Candida glabrata NOT DETECTED NOT DETECTED Final   Candida krusei NOT DETECTED NOT DETECTED Final   Candida parapsilosis NOT DETECTED NOT DETECTED Final   Candida tropicalis NOT DETECTED NOT DETECTED Final    Comment: Performed at [facility], [place] [address] [place], [place] [address]  Culture, blood (routine x 2)     Status: Abnormal   Collection Time: 05/13/17 11:10 PM  Result Value Ref Range Status   Specimen Description BLOOD BLOOD LEFT HAND  Final   Special Requests   Final    BOTTLES DRAWN AEROBIC AND ANAEROBIC Blood Culture adequate volume   Culture  Setup Time   Final    GRAM NEGATIVE RODS IN BOTH AEROBIC AND ANAEROBIC BOTTLES CRITICAL RESULT CALLED TO, READ BACK BY AND VERIFIED WITH: J LEGGE,PHARMD AT 1555 05/14/17 BY L BENFIELD Performed at [facility], [place] [address], [place], [place] [address]    Culture ESCHERICHIA COLI KLEBSIELLA PNEUMONIAE  (A)  Final   Report Status 05/16/2017 FINAL  Final   Organism ID, Bacteria ESCHERICHIA COLI  Final   Organism ID, Bacteria KLEBSIELLA PNEUMONIAE  Final      Susceptibility   Escherichia coli - MIC*    AMPICILLIN >=32 RESISTANT Resistant     CEFAZOLIN <=4 SENSITIVE Sensitive     CEFEPIME <=1 SENSITIVE Sensitive     CEFTAZIDIME <=1 SENSITIVE Sensitive     CEFTRIAXONE <=1 SENSITIVE Sensitive     CIPROFLOXACIN <=0.25 SENSITIVE Sensitive     GENTAMICIN <=1 SENSITIVE Sensitive     IMIPENEM <=0.25 SENSITIVE Sensitive     TRIMETH/SULFA >=320 RESISTANT  Resistant     AMPICILLIN/SULBACTAM 4 SENSITIVE Sensitive     PIP/TAZO <=4 SENSITIVE Sensitive     Extended ESBL NEGATIVE Sensitive     * ESCHERICHIA COLI   Klebsiella pneumoniae - MIC*    AMPICILLIN RESISTANT Resistant     CEFAZOLIN <=4 SENSITIVE Sensitive     CEFEPIME <=1 SENSITIVE Sensitive     CEFTAZIDIME <=1 SENSITIVE Sensitive     CEFTRIAXONE <=1 SENSITIVE Sensitive     CIPROFLOXACIN <=0.25 SENSITIVE Sensitive     GENTAMICIN <=1 SENSITIVE Sensitive     IMIPENEM <=0.25 SENSITIVE Sensitive     TRIMETH/SULFA <=20 SENSITIVE Sensitive     AMPICILLIN/SULBACTAM 4 SENSITIVE Sensitive     PIP/TAZO <=4 SENSITIVE Sensitive     Extended ESBL NEGATIVE Sensitive     * KLEBSIELLA PNEUMONIAE  Culture, blood (routine x 2)     Status: None (Preliminary result)   Collection Time: 05/15/17  9:53 AM  Result Value Ref Range Status   Specimen Description BLOOD RIGHT ANTECUBITAL  Final   Special Requests IN PEDIATRIC BOTTLE Blood Culture adequate volume  Final   Culture   Final    NO GROWTH 1 DAY Performed at [facility], [place] [address], [place],  [address]    Report Status PENDING  Incomplete  Culture, blood (routine x 2)     Status: None (Preliminary result)   Collection Time: 05/15/17  9:54 AM  Result Value Ref Range Status   Specimen Description BLOOD RIGHT FOREARM  Final   Special Requests   Final    BOTTLES DRAWN AEROBIC ONLY Blood Culture adequate volume   Culture   Final    NO GROWTH 1 DAY Performed at [facility], [place]  [address] [place], [place] [address]    Report Status PENDING  Incomplete    Radiology Reports Ct Abdomen Pelvis Wo Contrast  Result Date: 05/08/2017 CLINICAL DATA:  81 year old with elevated serum liver function tests and generalized itching. Surgical history includes cholecystectomy. Acute superimposed upon chronic renal failure (creatinine 2.00, estimated GFR 30) which precludes IV contrast. EXAM: CT ABDOMEN AND PELVIS WITHOUT CONTRAST  TECHNIQUE: Multidetector CT imaging of the abdomen and pelvis was performed following the standard protocol without IV contrast. Oral contrast was administered. COMPARISON:  CT abdomen pelvis 02/04/2015, 02/01/2015 and earlier. MRI abdomen 02/02/2015. FINDINGS: Lower chest: Minimal scarring in the right middle lobe, lingula and lower lobes. Visualized lung bases otherwise clear. Heart upper normal in size. Aortic valvular calcifications as noted previously. Hepatobiliary: Normal unenhanced appearance of the liver. Surgically absent gallbladder. Extrahepatic biliary ductal dilation up to 16 mm, not significantly changed since the prior CT from 2016. No visible common bile duct stones. See below comments regarding duodenal diverticulum. Pancreas: Cystic mass containing a solitary calcification involving the head of the pancreas, measuring approximately 1.4 x 1.2 x 1.1 cm (series 2, image 25 and coronal image 35), slightly increased in size since 2016. No evidence of pancreatic ductal dilation. No masses elsewhere involving the pancreas. No peripancreatic inflammation. Spleen: Normal unenhanced appearance. Adrenals/Urinary Tract: Normal appearing adrenal glands. Multiple benign cortical cysts involving both kidneys, the largest measuring approximately 4 cm arising from the upper pole the right kidney. Likely benign exophytic complex cyst arising from the lower pole of the left kidney anteriorly measuring approximately 1.0 x 1.2 x 1.2 cm, not reliably demonstrated on the prior CT or and not imaged on the prior MRI. No hydronephrosis. No urinary tract calculi on either side. Decompressed urinary bladder with diffusely thickened bladder wall. Stomach/Bowel: Stomach relatively decompressed and unremarkable. Diverticulum arising from the second portion the duodenum, and the common bile duct may enter the diverticulum or enters the duodenum just distal to the diverticulum. Small bowel otherwise normal in appearance. Diffuse  colonic diverticulosis, most extensive in the sigmoid colon, without evidence of acute diverticulitis. Moderate colonic stool burden. Normal retrocecal appendix in the right upper pelvis filling with oral contrast. Vascular/Lymphatic: Severe aortoiliofemoral atherosclerosis without evidence of aneurysm. No pathologic lymphadenopathy. Reproductive: Moderate prostate gland enlargement, including the median lobe. Normal seminal vesicles. Other: None. Musculoskeletal: Degenerative disc disease and spondylosis at L2-3. DISH involving the lower thoracic spine. Facet degenerative changes at L2-3, L4-5 and L5-S1. Degenerative changes involving the sacroiliac joints. Mild degenerative changes involving both hips. No acute osseous findings. IMPRESSION: 1. No acute abnormalities involving the abdomen or pelvis. 2. Normal unenhanced appearance of the liver. If the liver function tests remain abnormal, non-emergent right upper quadrant abdominal ultrasound and hepatic elastography may be helpful in further evaluation. 3. 1.2 cm cystic mass involving the head of the pancreas, minimally increased in size since 2016. Given the fact the mass is still less than 1.5 cm, follow-up CT abdomen in 1 year is recommended. This recommendation follows ACR consensus guidelines: Management of Incidental Pancreatic Cysts: A White Paper of the ACR Incidental Findings Committee. J Am Coll Radiol 6761;95:093-267. 4. Numerous benign cortical cysts involving both kidneys. Likely benign hemorrhagic or proteinaceous 1.2 cm cyst arising from the lower pole of the left kidney. As this lesion was not visible on the prior CT from 2016, follow-up abdominal CT in 3 months is recommended to confirm stability. 5. Diverticulum arising from the medial aspect of the duodenum. It appears that the  common bile duct may enter the diverticulum or enters just distal to the diverticulum. 6. Diffuse colonic diverticulosis without evidence of acute diverticulitis. 7.  Prostate gland enlargement, particularly the median lobe. 8. Generalized bladder wall thickening. This is likely due to chronic outlet obstruction related to the prostatomegaly, the can be seen in patients with cystitis. 9. Extrahepatic biliary ductal dilation up to 16 mm, unchanged since 2016, without evidence of obstructing stone or mass. Aortic Atherosclerosis (ICD10-I70.0). Electronically Signed   By: Evangeline Dakin M.D.   On: 05/08/2017 16:06   Mr Abdomen Mrcp Wo Contrast  Result Date: 05/13/2017 CLINICAL DATA:  Abdominal pain. Elevated liver function tests. Biliary dilatation seen on recent CT. Prior cholecystectomy. Renal insufficiency. EXAM: MRI ABDOMEN WITHOUT CONTRAST  (INCLUDING MRCP) TECHNIQUE: Multiplanar multisequence MR imaging of the abdomen was performed. Heavily T2-weighted images of the biliary and pancreatic ducts were obtained, and three-dimensional MRCP images were rendered by post processing. COMPARISON:  CT on 05/08/2017 FINDINGS: Lower chest: No acute findings. Hepatobiliary: No masses visualized on this unenhanced exam. Prior cholecystectomy. Diffuse biliary ductal dilatation measuring 14 mm. A calculus measuring approximately 1.4 cm is seen in the distal common bile duct. Pancreas: A 12 mm simple appearing cyst is seen within the pancreatic neck on image 28/8, with possible communication with main pancreatic duct. There is no evidence of pancreatic ductal dilatation or pancreas divisum. No other pancreatic lesions are identified. Spleen:  Within normal limits in size. Adrenals/Urinary tract: Several Bosniak category 1 and 2 cysts are seen in both kidneys. In addition, there are 2 sub-cm lesions in the anterior lower pole of the left kidney which shows T1 and T2 hypointensity. These cannot be characterized without IV contrast, and small low-grade papillary renal cell carcinomas cannot be excluded. No evidence of hydronephrosis. Stomach/Bowel: No evidence of obstruction, inflammatory  process, or abnormal fluid collections. Vascular/Lymphatic: No pathologically enlarged lymph nodes identified. No evidence of abdominal aortic aneurysm. Other:  None. Musculoskeletal:  No suspicious bone lesions identified. IMPRESSION: Prior cholecystectomy. Diffuse biliary ductal dilatation, with large calculus in distal common bile duct. 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 Recommend continued followup by abdomen MRI in 2 years. This recommendation follows ACR consensus guidelines: Management of Incidental Pancreatic Cysts: A White Paper of the ACR Incidental Findings Committee. [place] 1610;96:045-409. Two sub-cm indeterminate lesions in lower pole of left kidney which cannot be characterized without IV contrast. Small low-grade papillary renal cell carcinomas cannot be excluded. Recommend continued followup by abdomen MRI in 1 year. This recommendation follows ACR consensus guidelines: Management of the Incidental Renal Mass on CT: A White Paper of the ACR Incidental Findings Committee. J Am Coll Radiol [phone]. Electronically Signed   By: Earle Gell M.D.   On: 05/13/2017 20:47   Mr 3d Recon At Scanner  Result Date: 05/13/2017 CLINICAL DATA:  Abdominal pain. Elevated liver function tests. Biliary dilatation seen on recent CT. Prior cholecystectomy. Renal insufficiency. EXAM: MRI ABDOMEN WITHOUT CONTRAST  (INCLUDING MRCP) TECHNIQUE: Multiplanar multisequence MR imaging of the abdomen was performed. Heavily T2-weighted images of the biliary and pancreatic ducts were obtained, and three-dimensional MRCP images were rendered by post processing. COMPARISON:  CT on 05/08/2017 FINDINGS: Lower chest: No acute findings. Hepatobiliary: No masses visualized on this unenhanced exam. Prior cholecystectomy. Diffuse biliary ductal dilatation measuring 14 mm. A calculus measuring approximately 1.4 cm is seen in the distal  common bile duct. Pancreas: A 12 mm simple  appearing cyst is seen within the pancreatic neck on image 28/8, with possible communication with main pancreatic duct. There is no evidence of pancreatic ductal dilatation or pancreas divisum. No other pancreatic lesions are identified. Spleen:  Within normal limits in size. Adrenals/Urinary tract: Several Bosniak category 1 and 2 cysts are seen in both kidneys. In addition, there are 2 sub-cm lesions in the anterior lower pole of the left kidney which shows T1 and T2 hypointensity. These cannot be characterized without IV contrast, and small low-grade papillary renal cell carcinomas cannot be excluded. No evidence of hydronephrosis. Stomach/Bowel: No evidence of obstruction, inflammatory process, or abnormal fluid collections. Vascular/Lymphatic: No pathologically enlarged lymph nodes identified. No evidence of abdominal aortic aneurysm. Other:  None. Musculoskeletal:  No suspicious bone lesions identified. IMPRESSION: Prior cholecystectomy. Diffuse biliary ductal dilatation, with large calculus in distal common bile duct. 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 Recommend continued followup by abdomen MRI in 2 years. This recommendation follows ACR consensus guidelines: Management of Incidental Pancreatic Cysts: A White Paper of the ACR Incidental Findings Committee. [place] 0938;18:299-371. Two sub-cm indeterminate lesions in lower pole of left kidney which cannot be characterized without IV contrast. Small low-grade papillary renal cell carcinomas cannot be excluded. Recommend continued followup by abdomen MRI in 1 year. This recommendation follows ACR consensus guidelines: Management of the Incidental Renal Mass on CT: A White Paper of the ACR Incidental Findings Committee. J Am Coll Radiol [phone]. Electronically Signed   By: Earle Gell M.D.   On: 05/13/2017 20:47   Dg  Chest Port 1 View  Result Date: 05/13/2017 CLINICAL DATA:  Sudden onset fever EXAM: PORTABLE CHEST 1 VIEW COMPARISON:  02/06/2017 FINDINGS: Shallow inspiration with atelectasis in the lung bases. Heart size and pulmonary vascularity are normal for technique. No focal consolidation. No blunting of costophrenic angles. No pneumothorax. Calcification of the aorta. IMPRESSION: Shallow inspiration with atelectasis in the lung bases. Aortic atherosclerosis. Electronically Signed   By: Lucienne Capers M.D.   On: 05/13/2017 23:29   Dg Ercp Biliary & Pancreatic Ducts  Result Date: 05/14/2017 CLINICAL DATA:  Balloon stone or EXAM: ERCP TECHNIQUE: Multiple spot images obtained with the fluoroscopic device and submitted for interpretation post-procedure. FLUOROSCOPY TIME:  Fluoroscopy Time:  3 minutes and 14 seconds Radiation Exposure Index (if provided by the fluoroscopic device): Number of Acquired Spot Images: 0 COMPARISON:  None. FINDINGS: Imaging demonstrates cannulation of the common bile duct. Contrast fills the There is a large filling defect in the distal common bile duct. Balloon stone retrieval imaging is documented. IMPRESSION: See above. These images were submitted for radiologic interpretation only. Please see the procedural report for the amount of contrast and the fluoroscopy time utilized. Electronically Signed   By: Marybelle Killings M.D.   On: 05/14/2017 14:11     CBC Recent Labs  Lab 05/13/17 1629 05/14/17 0622 05/15/17 0402 05/16/17 0531  WBC 13.9* 12.5* 18.7* 15.0*  HGB 12.2* 9.9* 9.6* 9.5*  HCT 35.4* 29.0* 27.4* 27.9*  PLT 191 121* 80* 110*  MCV 89.4 90.1 90.7 90.9  MCH 30.8 30.7 31.8 30.9  MCHC 34.5 34.1 35.0 34.1  RDW 12.5 12.8 13.2 13.3  LYMPHSABS 0.7  --   --   --   MONOABS 0.4  --   --   --   EOSABS 0.0  --   --   --   BASOSABS 0.0  --   --   --  Chemistries  Recent Labs  Lab 05/13/17 1629 05/14/17 0622 05/15/17 0402 05/16/17 0531 05/16/17 1136  NA 131* 135 132*  136 134*  K 4.2 4.1 5.0 4.6 4.9  CL 97* 104 105 108 109  CO2 25 20* 20* 21* 20*  GLUCOSE 215* 240* 284* 320* 358*  BUN 36* 39* 47* 37* 33*  CREATININE 1.91* 2.17* 2.05* 1.72* 1.67*  CALCIUM 9.1 8.2* 7.6* 7.8* 7.7*  AST 450* 305* 202* 85* 73*  ALT 355* 298* 259* 187* 171*  ALKPHOS 401* 302* 218* 216* 208*  BILITOT 4.7* 6.2* 5.4* 1.9* 1.8*   ------------------------------------------------------------------------------------------------------------------ No results for input(s): CHOL, HDL, LDLCALC, TRIG, CHOLHDL, LDLDIRECT in the last 72 hours.  Lab Results  Component Value Date   HGBA1C 7.2 (H) 02/02/2015   ------------------------------------------------------------------------------------------------------------------ No results for input(s): TSH, T4TOTAL, T3FREE, THYROIDAB in the last 72 hours.  Invalid input(s): FREET3 ------------------------------------------------------------------------------------------------------------------ No results for input(s): VITAMINB12, FOLATE, FERRITIN, TIBC, IRON, RETICCTPCT in the last 72 hours.  Coagulation profile Recent Labs  Lab 05/13/17 1629 05/14/17 0622  INR 1.02 1.22    No results for input(s): DDIMER in the last 72 hours.  Cardiac Enzymes No results for input(s): CKMB, TROPONINI, MYOGLOBIN in the last 168 hours.  Invalid input(s): CK ------------------------------------------------------------------------------------------------------------------ No results found for: BNP   Roxan Hockey M.D on 05/16/2017 at 6:53 PM  Between 7am to 7pm - Pager - [phone]  After 7pm go to [email or web address] - password [email or web address]  [facility] -  Office  [phone]   Voice Recognition Viviann Spare dictation system was used to create this note, attempts have been made to correct errors. Please contact the author with questions and/or clarifications.

## 2017-05-17 LAB — CBC
HEMATOCRIT: 27.5 % — AB (ref 39.0–52.0)
Hemoglobin: 9.4 g/dL — ABNORMAL LOW (ref 13.0–17.0)
MCH: 30.9 pg (ref 26.0–34.0)
MCHC: 34.2 g/dL (ref 30.0–36.0)
MCV: 90.5 fL (ref 78.0–100.0)
PLATELETS: 86 10*3/uL — AB (ref 150–400)
RBC: 3.04 MIL/uL — AB (ref 4.22–5.81)
RDW: 13.5 % (ref 11.5–15.5)
WBC: 9 10*3/uL (ref 4.0–10.5)

## 2017-05-17 LAB — CREATININE, SERUM
CREATININE: 1.44 mg/dL — AB (ref 0.61–1.24)
GFR calc Af Amer: 52 mL/min — ABNORMAL LOW (ref >60)
GFR calc non Af Amer: 45 mL/min — ABNORMAL LOW (ref >60)

## 2017-05-17 MED ORDER — METRONIDAZOLE 500 MG PO TABS: 500.0000 mg | ORAL_TABLET | Freq: Three times a day (TID) | ORAL | 0 refills | Status: AC

## 2017-05-17 MED ORDER — CEFDINIR 300 MG PO CAPS: 300.0000 mg | ORAL_CAPSULE | Freq: Two times a day (BID) | ORAL | 0 refills | Status: DC

## 2017-05-17 MED ORDER — METFORMIN HCL ER 500 MG PO TB24: 500.0000 mg | ORAL_TABLET | Freq: Every evening | ORAL | 1 refills | Status: DC

## 2017-05-17 MED ORDER — DEXTROSE 5 % IV SOLN
2.0000 g | Freq: Once | INTRAVENOUS | Status: AC
  Administered 2017-05-17: 14:00:00 2 g via INTRAVENOUS
  Filled 2017-05-17: qty 2

## 2017-05-17 MED ORDER — TRIAMTERENE-HCTZ 75-50 MG PO TABS: 0.5000 | ORAL_TABLET | Freq: Every morning | ORAL | 0 refills | Status: DC

## 2017-05-17 MED ORDER — OMEPRAZOLE 20 MG PO CPDR: 20.0000 mg | DELAYED_RELEASE_CAPSULE | Freq: Every day | ORAL | 3 refills | Status: AC

## 2017-05-17 NOTE — Discharge Summary (Signed)
Jesse Hoffman, is a 81 y.o. male  DOB January 08, 1936  MRN [id number].  Admission date:  05/13/2017  Admitting Physician  Jesse Polite, MD  Discharge Date:  05/17/2017   Primary MD  Jesse Seashore, MD  Recommendations for primary care physician for things to follow:    Admission Diagnosis  Choledocholithiasis [K80.50] Transaminitis [R74.0] Abdominal pain, RUQ [R10.11]   Discharge Diagnosis  Choledocholithiasis [K80.50] Transaminitis [R74.0] Abdominal pain, RUQ [R10.11]    Principal Problem:   Choledocholithiasis/Cholangitis Active Problems:   Elevated liver enzymes   Diabetes mellitus without complication (HCC)   Abdominal pain   CKD stage 3 due to type 1 diabetes mellitus (HCC)   Elevated LFTs   Ascending cholangitis   Abdominal pain, RUQ      Past Medical History:  Diagnosis Date  . Ascending cholangitis 2006, 01/2015   2016: in setting of choledocholithiasis.  s/p ERCP/sphinct/stone extraction  . Cholangitis 01/2015  . Cholecystitis, acute with cholelithiasis 2006  . CKD (chronic kidney disease), stage III ([place])   . Diabetes mellitus without complication ([place])   . GERD (gastroesophageal reflux disease)   . Gout July 2013  . 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   Marland Kitchen HLD (hyperlipidemia)   . Hypertension   . Thrombocytopenia ([place]) 01/2015   in setting of sepsis, cholangitis.   . Tubular adenoma of colon 09/2012   on colonoscopy diverticulosis and hemorrhoids noted as well    Past Surgical History:  Procedure Laterality Date  . ERCP N/A 02/03/2015   Procedure: ENDOSCOPIC RETROGRADE CHOLANGIOPANCREATOGRAPHY (ERCP);  Surgeon: Carol Ada, MD;  Location: [facility] ENDOSCOPY;  Service: Endoscopy;  Laterality: N/A;  . ERCP N/A 05/14/2017   Procedure: ENDOSCOPIC RETROGRADE CHOLANGIOPANCREATOGRAPHY (ERCP);  Surgeon: Ladene Artist, MD;  Location:  [facility] ENDOSCOPY;  Service: Endoscopy;  Laterality: N/A;  . ERCP W/ SPHICTEROTOMY  06/2004    Ampulla in a diverticulum with multiple stones, found in the  common bile duct  . fracture arm     age 76  . LAPAROSCOPIC CHOLECYSTECTOMY  06/2004   dr Zella Richer  . WISDOM TOOTH EXTRACTION         HPI  from the history and physical done on the day of admission:     Chief Complaint: Abdominal pain and abnormal liver enzymes  HPI: Jesse Hoffman is a 81 y.o. male with medical history significant of cholangitis, choledocholithiasis , status post cholecystectomy, status post ERCP and sphincterotomy in 2006 and 2016, diabetes mellitus, hypertension reports that he developed severe itching few weeks ago as a result he went to his primary care doctor who noted significantly abnormal liver enzymes, alkaline phosphatase of 237, AST 151, ALT 223 and bilirubin of 2, he was sent to the emergency room where he had a unremarkable CT scan last week and was referred back to gastroenterology. In fact he was seen by [facility] NP this morning, with a Hoffman for MRI/MRCP, in the meantime after he went home in about  an hour he started developing progressive abdominal pain, called the GI office back and was advised to come to the emergency room. ED Course: LFTs worse from last week, alkaline phosphatase now 401, AST ALT in the 300 and 400 range and total bili is 4.7. Case was discussed with gastroenterology, they will follow up in a.m. -Patient denies any fevers or chills, no vomiting.     Hospital Course:    1)Choledocholithiases/withcholangitis with E coli and Klebsiella bacteremia-overall much improved, leukocytosis is resolved, no fevers no chills no Rigors. Pt has history of 2 previous ERCPs with sphincterotomy, the last in 2016, patient underwent MRCP this admission, underwent ERCP by Dr Jesse Hoffman on 05/14/17. Stopped IV Zosyn on 05/16/17, treated with  IV Rocephin based on culture sensitivity results, and add Flagyl  for anaerobic coverage.  GI input appreciated, repeat blood cultures from 05/15/2017 neg so far.  Ok to discharge home on [place] and Flagyl for 10 days  2) Pancreatic cystic mass- -needs FU with Surveillance abd MRI 12 months  3)DM- Restart metformin on 05/20/17   4)Lt Kidney Lesions-  needs FU with Surveillance abd MRI 12 months  5)AKi-creatinine is down to 1.4  6)HTN-stable, continue losartan, decrease Maxzide to half tablet due to concerns about dehydration and electrolyte abnormalities  Discharge Condition: stable  Follow UP as outlined   Consults obtained - Gi  Diet and Activity recommendation:  As advised  Discharge Instructions     Discharge Instructions    Call MD for:  difficulty breathing, headache or visual disturbances   Complete by:  As directed    Call MD for:  persistant dizziness or light-headedness   Complete by:  As directed    Call MD for:  persistant nausea and vomiting   Complete by:  As directed    Call MD for:  redness, tenderness, or signs of infection (pain, swelling, redness, odor or green/yellow discharge around incision site)   Complete by:  As directed    Call MD for:  severe uncontrolled pain   Complete by:  As directed    Call MD for:  temperature >100.4   Complete by:  As directed    Diet - low sodium heart healthy   Complete by:  As directed    Discharge instructions   Complete by:  As directed    Take Medications as prescribed  Follow-up with PCP or gastroenterologist in 1 week for recheck of CMP and CBC blood work  Avoid alcohol while taking metronidazole  Repeat MRI of the abdomen in 12 weeks to left kidney lesions and pancreatic cystic lesion with possibility of neoplasm   Increase activity slowly   Complete by:  As directed         Discharge Medications     Allergies as of 05/17/2017      Reactions   Allopurinol    hepatotoxicity      Medication List    TAKE these medications   atorvastatin 40 MG  tablet Commonly known as:  LIPITOR Take 1 tablet (40 mg total) by mouth daily at 6 PM. Restart after 1 week, when Liver function tests normalize   cefdinir 300 MG capsule Commonly known as:  OMNICEF Take 1 capsule (300 mg total) by mouth 2 (two) times daily.   hydrOXYzine 25 MG tablet Commonly known as:  ATARAX/VISTARIL Take 1 tablet (25 mg total) every 6 (six) hours as needed by mouth for itching.   LORazepam 1 MG tablet Commonly known as:  ATIVAN Take  1 tablet (1 mg total) every 6 (six) hours as needed by mouth for sleep.   losartan 50 MG tablet Commonly known as:  COZAAR Take 50 mg by mouth daily.   metFORMIN 500 MG 24 hr tablet Commonly known as:  GLUCOPHAGE-XR Take 1 tablet (500 mg total) by mouth every evening. Restart om 05/20/17 What changed:  additional instructions   metroNIDAZOLE 500 MG tablet Commonly known as:  FLAGYL Take 1 tablet (500 mg total) by mouth 3 (three) times daily for 10 days.   multivitamin with minerals Tabs tablet Take 1 tablet daily by mouth.   omeprazole 20 MG capsule Commonly known as:  PRILOSEC Take 1 capsule (20 mg total) by mouth daily. What changed:  when to take this   triamterene-hydrochlorothiazide 75-50 MG tablet Commonly known as:  MAXZIDE Take 0.5 tablets by mouth every morning. What changed:  how much to take       Major procedures and Radiology Reports - PLEASE review detailed and final reports for all details, in brief -   Ct Abdomen Pelvis Wo Contrast  Result Date: 05/08/2017 CLINICAL DATA:  81 year old with elevated serum liver function tests and generalized itching. Surgical history includes cholecystectomy. Acute superimposed upon chronic renal failure (creatinine 2.00, estimated GFR 30) which precludes IV contrast. EXAM: CT ABDOMEN AND PELVIS WITHOUT CONTRAST TECHNIQUE: Multidetector CT imaging of the abdomen and pelvis was performed following the standard protocol without IV contrast. Oral contrast was administered.  COMPARISON:  CT abdomen pelvis 02/04/2015, 02/01/2015 and earlier. MRI abdomen 02/02/2015. FINDINGS: Lower chest: Minimal scarring in the right middle lobe, lingula and lower lobes. Visualized lung bases otherwise clear. Heart upper normal in size. Aortic valvular calcifications as noted previously. Hepatobiliary: Normal unenhanced appearance of the liver. Surgically absent gallbladder. Extrahepatic biliary ductal dilation up to 16 mm, not significantly changed since the prior CT from 2016. No visible common bile duct stones. See below comments regarding duodenal diverticulum. Pancreas: Cystic mass containing a solitary calcification involving the head of the pancreas, measuring approximately 1.4 x 1.2 x 1.1 cm (series 2, image 25 and coronal image 35), slightly increased in size since 2016. No evidence of pancreatic ductal dilation. No masses elsewhere involving the pancreas. No peripancreatic inflammation. Spleen: Normal unenhanced appearance. Adrenals/Urinary Tract: Normal appearing adrenal glands. Multiple benign cortical cysts involving both kidneys, the largest measuring approximately 4 cm arising from the upper pole the right kidney. Likely benign exophytic complex cyst arising from the lower pole of the left kidney anteriorly measuring approximately 1.0 x 1.2 x 1.2 cm, not reliably demonstrated on the prior CT or and not imaged on the prior MRI. No hydronephrosis. No urinary tract calculi on either side. Decompressed urinary bladder with diffusely thickened bladder wall. Stomach/Bowel: Stomach relatively decompressed and unremarkable. Diverticulum arising from the second portion the duodenum, and the common bile duct may enter the diverticulum or enters the duodenum just distal to the diverticulum. Small bowel otherwise normal in appearance. Diffuse colonic diverticulosis, most extensive in the sigmoid colon, without evidence of acute diverticulitis. Moderate colonic stool burden. Normal retrocecal appendix  in the right upper pelvis filling with oral contrast. Vascular/Lymphatic: Severe aortoiliofemoral atherosclerosis without evidence of aneurysm. No pathologic lymphadenopathy. Reproductive: Moderate prostate gland enlargement, including the median lobe. Normal seminal vesicles. Other: None. Musculoskeletal: Degenerative disc disease and spondylosis at L2-3. DISH involving the lower thoracic spine. Facet degenerative changes at L2-3, L4-5 and L5-S1. Degenerative changes involving the sacroiliac joints. Mild degenerative changes involving both hips. No acute osseous findings.  IMPRESSION: 1. No acute abnormalities involving the abdomen or pelvis. 2. Normal unenhanced appearance of the liver. If the liver function tests remain abnormal, non-emergent right upper quadrant abdominal ultrasound and hepatic elastography may be helpful in further evaluation. 3. 1.2 cm cystic mass involving the head of the pancreas, minimally increased in size since 2016. Given the fact the mass is still less than 1.5 cm, follow-up CT abdomen in 1 year is recommended. This recommendation follows ACR consensus guidelines: Management of Incidental Pancreatic Cysts: A White Paper of the ACR Incidental Findings Committee. J Am Coll Radiol 1937;90:240-973. 4. Numerous benign cortical cysts involving both kidneys. Likely benign hemorrhagic or proteinaceous 1.2 cm cyst arising from the lower pole of the left kidney. As this lesion was not visible on the prior CT from 2016, follow-up abdominal CT in 3 months is recommended to confirm stability. 5. Diverticulum arising from the medial aspect of the duodenum. It appears that the common bile duct may enter the diverticulum or enters just distal to the diverticulum. 6. Diffuse colonic diverticulosis without evidence of acute diverticulitis. 7. Prostate gland enlargement, particularly the median lobe. 8. Generalized bladder wall thickening. This is likely due to chronic outlet obstruction related to the  prostatomegaly, the can be seen in patients with cystitis. 9. Extrahepatic biliary ductal dilation up to 16 mm, unchanged since 2016, without evidence of obstructing stone or mass. Aortic Atherosclerosis (ICD10-I70.0). Electronically Signed   By: Evangeline Dakin M.D.   On: 05/08/2017 16:06   Mr Abdomen Mrcp Wo Contrast  Result Date: 05/13/2017 CLINICAL DATA:  Abdominal pain. Elevated liver function tests. Biliary dilatation seen on recent CT. Prior cholecystectomy. Renal insufficiency. EXAM: MRI ABDOMEN WITHOUT CONTRAST  (INCLUDING MRCP) TECHNIQUE: Multiplanar multisequence MR imaging of the abdomen was performed. Heavily T2-weighted images of the biliary and pancreatic ducts were obtained, and three-dimensional MRCP images were rendered by post processing. COMPARISON:  CT on 05/08/2017 FINDINGS: Lower chest: No acute findings. Hepatobiliary: No masses visualized on this unenhanced exam. Prior cholecystectomy. Diffuse biliary ductal dilatation measuring 14 mm. A calculus measuring approximately 1.4 cm is seen in the distal common bile duct. Pancreas: A 12 mm simple appearing cyst is seen within the pancreatic neck on image 28/8, with possible communication with main pancreatic duct. There is no evidence of pancreatic ductal dilatation or pancreas divisum. No other pancreatic lesions are identified. Spleen:  Within normal limits in size. Adrenals/Urinary tract: Several Bosniak category 1 and 2 cysts are seen in both kidneys. In addition, there are 2 sub-cm lesions in the anterior lower pole of the left kidney which shows T1 and T2 hypointensity. These cannot be characterized without IV contrast, and small low-grade papillary renal cell carcinomas cannot be excluded. No evidence of hydronephrosis. Stomach/Bowel: No evidence of obstruction, inflammatory process, or abnormal fluid collections. Vascular/Lymphatic: No pathologically enlarged lymph nodes identified. No evidence of abdominal aortic aneurysm. Other:   None. Musculoskeletal:  No suspicious bone lesions identified. IMPRESSION: Prior cholecystectomy. Diffuse biliary ductal dilatation, with large calculus in distal common bile duct. 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 Recommend continued followup by abdomen MRI in 2 years. This recommendation follows ACR consensus guidelines: Management of Incidental Pancreatic Cysts: A White Paper of the ACR Incidental Findings Committee. [place] 5329;92:426-834. Two sub-cm indeterminate lesions in lower pole of left kidney which cannot be characterized without IV contrast. Small low-grade papillary renal cell carcinomas cannot be excluded. Recommend continued followup by abdomen  MRI in 1 year. This recommendation follows ACR consensus guidelines: Management of the Incidental Renal Mass on CT: A White Paper of the ACR Incidental Findings Committee. J Am Coll Radiol [phone]. Electronically Signed   By: Earle Gell M.D.   On: 05/13/2017 20:47   Mr 3d Recon At Scanner  Result Date: 05/13/2017 CLINICAL DATA:  Abdominal pain. Elevated liver function tests. Biliary dilatation seen on recent CT. Prior cholecystectomy. Renal insufficiency. EXAM: MRI ABDOMEN WITHOUT CONTRAST  (INCLUDING MRCP) TECHNIQUE: Multiplanar multisequence MR imaging of the abdomen was performed. Heavily T2-weighted images of the biliary and pancreatic ducts were obtained, and three-dimensional MRCP images were rendered by post processing. COMPARISON:  CT on 05/08/2017 FINDINGS: Lower chest: No acute findings. Hepatobiliary: No masses visualized on this unenhanced exam. Prior cholecystectomy. Diffuse biliary ductal dilatation measuring 14 mm. A calculus measuring approximately 1.4 cm is seen in the distal common bile duct. Pancreas: A 12 mm simple appearing cyst is seen within the pancreatic neck on image 28/8, with possible communication with main pancreatic duct.  There is no evidence of pancreatic ductal dilatation or pancreas divisum. No other pancreatic lesions are identified. Spleen:  Within normal limits in size. Adrenals/Urinary tract: Several Bosniak category 1 and 2 cysts are seen in both kidneys. In addition, there are 2 sub-cm lesions in the anterior lower pole of the left kidney which shows T1 and T2 hypointensity. These cannot be characterized without IV contrast, and small low-grade papillary renal cell carcinomas cannot be excluded. No evidence of hydronephrosis. Stomach/Bowel: No evidence of obstruction, inflammatory process, or abnormal fluid collections. Vascular/Lymphatic: No pathologically enlarged lymph nodes identified. No evidence of abdominal aortic aneurysm. Other:  None. Musculoskeletal:  No suspicious bone lesions identified. IMPRESSION: Prior cholecystectomy. Diffuse biliary ductal dilatation, with large calculus in distal common bile duct. 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 Recommend continued followup by abdomen MRI in 2 years. This recommendation follows ACR consensus guidelines: Management of Incidental Pancreatic Cysts: A White Paper of the ACR Incidental Findings Committee. [place] 1448;18:563-149. Two sub-cm indeterminate lesions in lower pole of left kidney which cannot be characterized without IV contrast. Small low-grade papillary renal cell carcinomas cannot be excluded. Recommend continued followup by abdomen MRI in 1 year. This recommendation follows ACR consensus guidelines: Management of the Incidental Renal Mass on CT: A White Paper of the ACR Incidental Findings Committee. J Am Coll Radiol [phone]. Electronically Signed   By: Earle Gell M.D.   On: 05/13/2017 20:47   Dg Chest Port 1 View  Result Date: 05/13/2017 CLINICAL DATA:  Sudden onset fever EXAM: PORTABLE CHEST 1 VIEW COMPARISON:  02/06/2017 FINDINGS: Shallow inspiration  with atelectasis in the lung bases. Heart size and pulmonary vascularity are normal for technique. No focal consolidation. No blunting of costophrenic angles. No pneumothorax. Calcification of the aorta. IMPRESSION: Shallow inspiration with atelectasis in the lung bases. Aortic atherosclerosis. Electronically Signed   By: Lucienne Capers M.D.   On: 05/13/2017 23:29   Dg Ercp Biliary & Pancreatic Ducts  Result Date: 05/14/2017 CLINICAL DATA:  Balloon stone or EXAM: ERCP TECHNIQUE: Multiple spot images obtained with the fluoroscopic device and submitted for interpretation post-procedure. FLUOROSCOPY TIME:  Fluoroscopy Time:  3 minutes and 14 seconds Radiation Exposure Index (if provided by the fluoroscopic device): Number of Acquired Spot Images: 0 COMPARISON:  None. FINDINGS: Imaging demonstrates cannulation of the common bile duct. Contrast fills the There is a large filling  defect in the distal common bile duct. Balloon stone retrieval imaging is documented. IMPRESSION: See above. These images were submitted for radiologic interpretation only. Please see the procedural report for the amount of contrast and the fluoroscopy time utilized. Electronically Signed   By: Marybelle Killings M.D.   On: 05/14/2017 14:11    Micro Results    Recent Results (from the past 240 hour(s))  Urine culture     Status: None   Collection Time: 05/13/17  6:50 PM  Result Value Ref Range Status   Specimen Description URINE, CLEAN CATCH  Final   Special Requests NONE  Final   Culture   Final    NO GROWTH Performed at [facility], [address], [place], [place] [address]    Report Status 05/15/2017 FINAL  Final  Culture, blood (routine x 2)     Status: Abnormal   Collection Time: 05/13/17 11:02 PM  Result Value Ref Range Status   Specimen Description BLOOD LEFT ANTECUBITAL  Final   Special Requests   Final    BOTTLES DRAWN AEROBIC AND ANAEROBIC Blood Culture adequate volume   Culture  Setup Time   Final     GRAM NEGATIVE RODS IN BOTH AEROBIC AND ANAEROBIC BOTTLES CRITICAL RESULT CALLED TO, READ BACK BY AND VERIFIED WITH: J LEGGE,PHARMD AT 3254 05/14/17 BY L BENFIELD    Culture (A)  Final    ESCHERICHIA COLI KLEBSIELLA PNEUMONIAE SUSCEPTIBILITIES PERFORMED ON PREVIOUS CULTURE WITHIN THE LAST 5 DAYS. Performed at [facility], [place] [address], [place],  [address]    Report Status 05/16/2017 FINAL  Final  Blood Culture ID Panel (Reflexed)     Status: Abnormal   Collection Time: 05/13/17 11:02 PM  Result Value Ref Range Status   Enterococcus species NOT DETECTED NOT DETECTED Final   Listeria monocytogenes NOT DETECTED NOT DETECTED Final   Staphylococcus species NOT DETECTED NOT DETECTED Final   Staphylococcus aureus NOT DETECTED NOT DETECTED Final   Streptococcus species NOT DETECTED NOT DETECTED Final   Streptococcus agalactiae NOT DETECTED NOT DETECTED Final   Streptococcus pneumoniae NOT DETECTED NOT DETECTED Final   Streptococcus pyogenes NOT DETECTED NOT DETECTED Final   Acinetobacter baumannii NOT DETECTED NOT DETECTED Final   Enterobacteriaceae species DETECTED (A) NOT DETECTED Final    Comment: CRITICAL RESULT CALLED TO, READ BACK BY AND VERIFIED WITH: J LEGGE,PHARMD AT 1555 05/14/17 BY L BENFIELD    Enterobacter cloacae complex NOT DETECTED NOT DETECTED Final   Escherichia coli DETECTED (A) NOT DETECTED Final    Comment: CRITICAL RESULT CALLED TO, READ BACK BY AND VERIFIED WITH: J LEGGE,PHARMD AT 1555 05/14/17 BY L BENFIELD    Klebsiella oxytoca NOT DETECTED NOT DETECTED Final   Klebsiella pneumoniae DETECTED (A) NOT DETECTED Final    Comment: CRITICAL RESULT CALLED TO, READ BACK BY AND VERIFIED WITH: J LEGGE,PHARMD AT 1555 05/14/17 BY L BENFIELD    Proteus species NOT DETECTED NOT DETECTED Final   Serratia marcescens NOT DETECTED NOT DETECTED Final   Carbapenem resistance NOT DETECTED NOT DETECTED Final   Haemophilus influenzae NOT DETECTED NOT DETECTED Final    Neisseria meningitidis NOT DETECTED NOT DETECTED Final   Pseudomonas aeruginosa NOT DETECTED NOT DETECTED Final   Candida albicans NOT DETECTED NOT DETECTED Final   Candida glabrata NOT DETECTED NOT DETECTED Final   Candida krusei NOT DETECTED NOT DETECTED Final   Candida parapsilosis NOT DETECTED NOT DETECTED Final   Candida tropicalis NOT DETECTED NOT DETECTED Final  Comment: Performed at [facility], [place] [address], [place], [place] [address]  Culture, blood (routine x 2)     Status: Abnormal   Collection Time: 05/13/17 11:10 PM  Result Value Ref Range Status   Specimen Description BLOOD BLOOD LEFT HAND  Final   Special Requests   Final    BOTTLES DRAWN AEROBIC AND ANAEROBIC Blood Culture adequate volume   Culture  Setup Time   Final    GRAM NEGATIVE RODS IN BOTH AEROBIC AND ANAEROBIC BOTTLES CRITICAL RESULT CALLED TO, READ BACK BY AND VERIFIED WITH: J LEGGE,PHARMD AT 1555 05/14/17 BY L BENFIELD Performed at [facility], [place] [address] [place], [place] [address]    Culture ESCHERICHIA COLI KLEBSIELLA PNEUMONIAE  (A)  Final   Report Status 05/16/2017 FINAL  Final   Organism ID, Bacteria ESCHERICHIA COLI  Final   Organism ID, Bacteria KLEBSIELLA PNEUMONIAE  Final      Susceptibility   Escherichia coli - MIC*    AMPICILLIN >=32 RESISTANT Resistant     CEFAZOLIN <=4 SENSITIVE Sensitive     CEFEPIME <=1 SENSITIVE Sensitive     CEFTAZIDIME <=1 SENSITIVE Sensitive     CEFTRIAXONE <=1 SENSITIVE Sensitive     CIPROFLOXACIN <=0.25 SENSITIVE Sensitive     GENTAMICIN <=1 SENSITIVE Sensitive     IMIPENEM <=0.25 SENSITIVE Sensitive     TRIMETH/SULFA >=320 RESISTANT Resistant     AMPICILLIN/SULBACTAM 4 SENSITIVE Sensitive     PIP/TAZO <=4 SENSITIVE Sensitive     Extended ESBL NEGATIVE Sensitive     * ESCHERICHIA COLI   Klebsiella pneumoniae - MIC*    AMPICILLIN RESISTANT Resistant     CEFAZOLIN <=4 SENSITIVE Sensitive     CEFEPIME <=1 SENSITIVE Sensitive      CEFTAZIDIME <=1 SENSITIVE Sensitive     CEFTRIAXONE <=1 SENSITIVE Sensitive     CIPROFLOXACIN <=0.25 SENSITIVE Sensitive     GENTAMICIN <=1 SENSITIVE Sensitive     IMIPENEM <=0.25 SENSITIVE Sensitive     TRIMETH/SULFA <=20 SENSITIVE Sensitive     AMPICILLIN/SULBACTAM 4 SENSITIVE Sensitive     PIP/TAZO <=4 SENSITIVE Sensitive     Extended ESBL NEGATIVE Sensitive     * KLEBSIELLA PNEUMONIAE  Culture, blood (routine x 2)     Status: None (Preliminary result)   Collection Time: 05/15/17  9:53 AM  Result Value Ref Range Status   Specimen Description BLOOD RIGHT ANTECUBITAL  Final   Special Requests IN PEDIATRIC BOTTLE Blood Culture adequate volume  Final   Culture   Final    NO GROWTH 1 DAY Performed at [facility], [place] [address], [place],  [address]    Report Status PENDING  Incomplete  Culture, blood (routine x 2)     Status: None (Preliminary result)   Collection Time: 05/15/17  9:54 AM  Result Value Ref Range Status   Specimen Description BLOOD RIGHT FOREARM  Final   Special Requests   Final    BOTTLES DRAWN AEROBIC ONLY Blood Culture adequate volume   Culture   Final    NO GROWTH 1 DAY Performed at [facility], [place] [address], [place],  [address]    Report Status PENDING  Incomplete       Today   Subjective    Yovan Leeman today has no new complaints, no f/c, no n/v/d   , no rigors    .  Hoffman of care discussed with patient and his wife at bedside,  Patient has been seen and examined prior to discharge   Objective   Blood pressure (!) 145/76, pulse 81, temperature 99 F (37.2 C), temperature source Oral, resp. rate 18, height 5\' 11"  (1.803 m), weight 78.5 kg (173 lb), SpO2 97 %.   Intake/Output Summary (Last 24 hours) at 05/17/2017 1148 Last data filed at 05/17/2017 1015 Gross per 24 hour  Intake 1320 ml  Output 425 ml  Net 895 ml    Exam Gen:- Awake  In no apparent distress  HEENT:- [place].AT,   Neck-Supple Neck,No JVD,    Lungs- mostly clear  CV- S1, S2 normal Abd-  +ve B.Sounds, Abd Soft, No tenderness,    Extremity/Skin:- Intact peripheral pulses     Data Review   CBC w Diff:  Lab Results  Component Value Date   WBC 9.0 05/17/2017   HGB 9.4 (L) 05/17/2017   HCT 27.5 (L) 05/17/2017   PLT 86 (L) 05/17/2017   LYMPHOPCT 5 05/13/2017   MONOPCT 3 05/13/2017   EOSPCT 0 05/13/2017   BASOPCT 0 05/13/2017    CMP:  Lab Results  Component Value Date   NA 134 (L) 05/16/2017   K 4.9 05/16/2017   CL 109 05/16/2017   CO2 20 (L) 05/16/2017   BUN 33 (H) 05/16/2017   CREATININE 1.44 (H) 05/17/2017   PROT 5.3 (L) 05/16/2017   ALBUMIN 2.3 (L) 05/16/2017   BILITOT 1.8 (H) 05/16/2017   ALKPHOS 208 (H) 05/16/2017   AST 73 (H) 05/16/2017   ALT 171 (H) 05/16/2017  .   Total Discharge time is about 33 minutes  Roxan Hockey M.D on 05/17/2017 at 11:48 AM  [facility]   Office  [phone]  Voice Recognition Viviann Spare dictation system was used to create this note, attempts have been made to correct errors. Please contact the author with questions and/or clarifications.

## 2017-05-18 ENCOUNTER — Telehealth: Payer: Self-pay

## 2017-05-18 DIAGNOSIS — K8050 Calculus of bile duct without cholangitis or cholecystitis without obstruction: Secondary | ICD-10-CM

## 2017-05-18 NOTE — Telephone Encounter (Signed)
-----   Message from [place], [place] sent at 05/15/2017 11:22 AM EST ----- Regarding: OV and Labs Patient needs OV with Dr. Ardis Hughs in 4-6 weeks and repeat LFT's in 1 month after recent ERCP for choledocholithiasis and cholangitis. Please arrange. Thank you, JLL

## 2017-05-19 ENCOUNTER — Encounter (HOSPITAL_COMMUNITY): Payer: Self-pay | Admitting: Gastroenterology

## 2017-05-19 DIAGNOSIS — K7200 Acute and subacute hepatic failure without coma: Secondary | ICD-10-CM | POA: Diagnosis not present

## 2017-05-19 DIAGNOSIS — Z09 Encounter for follow-up examination after completed treatment for conditions other than malignant neoplasm: Secondary | ICD-10-CM | POA: Diagnosis not present

## 2017-05-19 DIAGNOSIS — N289 Disorder of kidney and ureter, unspecified: Secondary | ICD-10-CM | POA: Diagnosis not present

## 2017-05-19 NOTE — Telephone Encounter (Signed)
The pt has a follow up with Nevin Bloodgood on 12/7

## 2017-05-20 LAB — CULTURE, BLOOD (ROUTINE X 2)
CULTURE: NO GROWTH
Culture: NO GROWTH
SPECIAL REQUESTS: ADEQUATE
SPECIAL REQUESTS: ADEQUATE

## 2017-05-25 ENCOUNTER — Ambulatory Visit (HOSPITAL_COMMUNITY): Admission: RE | Admit: 2017-05-25 | Payer: Medicare Other | Source: Ambulatory Visit

## 2017-05-29 ENCOUNTER — Encounter: Payer: Self-pay | Admitting: Nurse Practitioner

## 2017-05-29 ENCOUNTER — Other Ambulatory Visit (INDEPENDENT_AMBULATORY_CARE_PROVIDER_SITE_OTHER): Payer: Self-pay

## 2017-05-29 ENCOUNTER — Ambulatory Visit: Payer: Medicare Other | Admitting: Nurse Practitioner

## 2017-05-29 VITALS — BP 85/60 | HR 54 | Ht 71.0 in | Wt 168.0 lb

## 2017-05-29 DIAGNOSIS — I959 Hypotension, unspecified: Secondary | ICD-10-CM

## 2017-05-29 DIAGNOSIS — K8309 Other cholangitis: Secondary | ICD-10-CM

## 2017-05-29 LAB — CBC WITH DIFFERENTIAL/PLATELET
BASOS ABS: 0.1 10*3/uL (ref 0.0–0.1)
Basophils Relative: 1.1 % (ref 0.0–3.0)
EOS ABS: 0.1 10*3/uL (ref 0.0–0.7)
Eosinophils Relative: 1.8 % (ref 0.0–5.0)
HEMATOCRIT: 35.5 % — AB (ref 39.0–52.0)
Hemoglobin: 12.1 g/dL — ABNORMAL LOW (ref 13.0–17.0)
LYMPHS PCT: 23 % (ref 12.0–46.0)
Lymphs Abs: 1.5 10*3/uL (ref 0.7–4.0)
MCHC: 34.1 g/dL (ref 30.0–36.0)
MCV: 90.7 fl (ref 78.0–100.0)
Monocytes Absolute: 0.8 10*3/uL (ref 0.1–1.0)
Monocytes Relative: 12.6 % — ABNORMAL HIGH (ref 3.0–12.0)
NEUTROS ABS: 4 10*3/uL (ref 1.4–7.7)
NEUTROS PCT: 61.5 % (ref 43.0–77.0)
PLATELETS: 250 10*3/uL (ref 150.0–400.0)
RBC: 3.91 Mil/uL — ABNORMAL LOW (ref 4.22–5.81)
RDW: 13.2 % (ref 11.5–15.5)
WBC: 6.5 10*3/uL (ref 4.0–10.5)

## 2017-05-29 LAB — COMPREHENSIVE METABOLIC PANEL
ALBUMIN: 3.7 g/dL (ref 3.5–5.2)
ALT: 21 U/L (ref 0–53)
AST: 19 U/L (ref 0–37)
Alkaline Phosphatase: 168 U/L — ABNORMAL HIGH (ref 39–117)
BILIRUBIN TOTAL: 1.1 mg/dL (ref 0.2–1.2)
BUN: 16 mg/dL (ref 6–23)
CALCIUM: 8.8 mg/dL (ref 8.4–10.5)
CO2: 26 meq/L (ref 19–32)
CREATININE: 1.16 mg/dL (ref 0.40–1.50)
Chloride: 100 mEq/L (ref 96–112)
GFR: 64.53 mL/min (ref >60.00)
Glucose, Bld: 187 mg/dL — ABNORMAL HIGH (ref 70–99)
Potassium: 4.3 mEq/L (ref 3.5–5.1)
Sodium: 133 mEq/L — ABNORMAL LOW (ref 135–145)
Total Protein: 7 g/dL (ref 6.0–8.3)

## 2017-05-29 NOTE — Progress Notes (Signed)
      Chief Complaint:  Hospital follow up   HPI: Patient is a 81 year old male hospitalized  in 2006 for cholangitis and choledocholithiasis.  I saw him late November for itching and abnormal liver function studies. Plan was for MRCP but shortly after leaving the office patient developed abdominal pain. Patient was advised to go to the emergency department. His white count was elevated, MRCP showed a common bile duct stone. He became febrile, cholangitis was suspected. He underwent ERCP with findings of a large periampullary diverticulum, prior small sphincterotomy. Stones and sludge removal was complete. An extended biliary sphincterotomy was done. Pus was found in the biliary tree consistent with cholangitis. Patient was discharged home on additional antibiotics. He is here for follow-up  Mr. Granieri and feels much better. No further itching. No abdominal pain. He is eating and drinking adequately. He does not have a lot of energy but no dizziness chest pains or shortness of breath.    Past Medical History:  Diagnosis Date  . Ascending cholangitis 2006, 01/2015   2016: in setting of choledocholithiasis.  s/p ERCP/sphinct/stone extraction  . Cholangitis 01/2015  . Cholecystitis, acute with cholelithiasis 2006  . CKD (chronic kidney disease), stage III ([place])   . Diabetes mellitus without complication ([place])   . GERD (gastroesophageal reflux disease)   . Gout July 2013  . 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   Marland Kitchen HLD (hyperlipidemia)   . Hypertension   . Thrombocytopenia ([place]) 01/2015   in setting of sepsis, cholangitis.   . Tubular adenoma of colon 09/2012   on colonoscopy diverticulosis and hemorrhoids noted as well    Patient's surgical history, family medical history, social history, medications and allergies were all reviewed in Epic    Physical Exam: BP (!) 102/54   Pulse (!) 54   Ht 5\' 11"  (1.803 m)   Wt 168 lb (76.2 kg)   BMI 23.43  kg/m   I recheck BP sitting and it was 85/60, about the same standing.   GENERAL:  Well-developed white male in NAD PSYCH: :Pleasant, cooperative, normal affect EENT:  conjunctiva pink, mucous membranes moist, neck supple without masses CARDIAC:  RRR, no peripheral edema PULM: Normal respiratory effort ABDOMEN:  Nondistended, soft, nontender. No obvious masses, no hepatomegaly,  normal bowel sounds SKIN:  turgor, no lesions seen Musculoskeletal:  Normal muscle tone, normal strength NEURO: Alert and oriented x 3, no focal neurologic deficits    ASSESSMENT and PLAN:  1. Pleasant 81 year old recently admitted with cholangitis/recurrent choledocholithiasis. He is status post ERCP with stone / sludge removal / extension of sphincterotomy. Is completed antibiotics. Feels weak but otherwise no GI symptoms now.  -Repeat LFTs -His blood pressure is low today. I do not think he is volume depleted though. He is not orthostatic, mucous membranes are moist, he is taking what sounds like adequate by mouth intake. Encouraged him to push fluids today. Will check serum chemistries. I asked him to hold today's BP meds if not already taken and to call PCP today about low BP. Again, other than feeling a little weak, he feels okay.  -I will call him with lab results.   2. Hypotension, see above   Tye Savoy , NP 05/29/2017, 11:03 AM

## 2017-05-29 NOTE — Patient Instructions (Signed)
If you are age 79 or older, your body mass index should be between 23-30. Your Body mass index is 23.43 kg/m. If this is out of the aforementioned range listed, please consider follow up with your Primary Care Provider.  If you are age 23 or younger, your body mass index should be between 19-25. Your Body mass index is 23.43 kg/m. If this is out of the aformentioned range listed, please consider follow up with your Primary Care Provider.   Your physician has requested that you go to the basement for the following lab work before leaving today: CBC w/Diff CMET  Drink 8 ounces of water every 4 hours today until bedtime.  CALL PRIMARY CARE PROVIDER  TODAY regarding blood pressure.  HOLD blood pressure medications until you have talked with primary care provider.  Thank you for choosing me and [facility].   Tye Savoy, NP

## 2017-05-29 NOTE — Progress Notes (Signed)
I agree with the above note, plan 

## 2017-06-03 ENCOUNTER — Other Ambulatory Visit: Payer: Self-pay

## 2017-06-03 DIAGNOSIS — R748 Abnormal levels of other serum enzymes: Secondary | ICD-10-CM

## 2017-06-18 ENCOUNTER — Other Ambulatory Visit (INDEPENDENT_AMBULATORY_CARE_PROVIDER_SITE_OTHER): Payer: Medicare Other

## 2017-06-18 DIAGNOSIS — K8050 Calculus of bile duct without cholangitis or cholecystitis without obstruction: Secondary | ICD-10-CM

## 2017-06-18 LAB — HEPATIC FUNCTION PANEL
ALK PHOS: 105 U/L (ref 39–117)
ALT: 18 U/L (ref 0–53)
AST: 21 U/L (ref 0–37)
Albumin: 3.7 g/dL (ref 3.5–5.2)
BILIRUBIN DIRECT: 0.3 mg/dL (ref 0.0–0.3)
BILIRUBIN TOTAL: 1.2 mg/dL (ref 0.2–1.2)
Total Protein: 6.5 g/dL (ref 6.0–8.3)

## 2017-07-27 DIAGNOSIS — E782 Mixed hyperlipidemia: Secondary | ICD-10-CM | POA: Diagnosis not present

## 2017-07-27 DIAGNOSIS — M1A0790 Idiopathic chronic gout, unspecified ankle and foot, without tophus (tophi): Secondary | ICD-10-CM | POA: Diagnosis not present

## 2017-07-27 DIAGNOSIS — E1165 Type 2 diabetes mellitus with hyperglycemia: Secondary | ICD-10-CM | POA: Diagnosis not present

## 2017-07-27 DIAGNOSIS — Z0000 Encounter for general adult medical examination without abnormal findings: Secondary | ICD-10-CM | POA: Diagnosis not present

## 2017-07-27 DIAGNOSIS — N183 Chronic kidney disease, stage 3 (moderate): Secondary | ICD-10-CM | POA: Diagnosis not present

## 2017-07-27 DIAGNOSIS — I129 Hypertensive chronic kidney disease with stage 1 through stage 4 chronic kidney disease, or unspecified chronic kidney disease: Secondary | ICD-10-CM | POA: Diagnosis not present

## 2017-08-03 DIAGNOSIS — M1A0790 Idiopathic chronic gout, unspecified ankle and foot, without tophus (tophi): Secondary | ICD-10-CM | POA: Diagnosis not present

## 2017-08-03 DIAGNOSIS — N183 Chronic kidney disease, stage 3 (moderate): Secondary | ICD-10-CM | POA: Diagnosis not present

## 2017-08-03 DIAGNOSIS — E1165 Type 2 diabetes mellitus with hyperglycemia: Secondary | ICD-10-CM | POA: Diagnosis not present

## 2017-08-03 DIAGNOSIS — E782 Mixed hyperlipidemia: Secondary | ICD-10-CM | POA: Diagnosis not present

## 2017-08-03 DIAGNOSIS — I129 Hypertensive chronic kidney disease with stage 1 through stage 4 chronic kidney disease, or unspecified chronic kidney disease: Secondary | ICD-10-CM | POA: Diagnosis not present

## 2017-11-05 DIAGNOSIS — H6090 Unspecified otitis externa, unspecified ear: Secondary | ICD-10-CM | POA: Diagnosis not present

## 2018-01-08 DIAGNOSIS — E119 Type 2 diabetes mellitus without complications: Secondary | ICD-10-CM | POA: Diagnosis not present

## 2018-01-08 DIAGNOSIS — H25013 Cortical age-related cataract, bilateral: Secondary | ICD-10-CM | POA: Diagnosis not present

## 2018-01-08 DIAGNOSIS — H43812 Vitreous degeneration, left eye: Secondary | ICD-10-CM | POA: Diagnosis not present

## 2018-01-08 DIAGNOSIS — H5203 Hypermetropia, bilateral: Secondary | ICD-10-CM | POA: Diagnosis not present

## 2018-01-12 DIAGNOSIS — L299 Pruritus, unspecified: Secondary | ICD-10-CM | POA: Diagnosis not present

## 2018-03-01 DIAGNOSIS — I129 Hypertensive chronic kidney disease with stage 1 through stage 4 chronic kidney disease, or unspecified chronic kidney disease: Secondary | ICD-10-CM | POA: Diagnosis not present

## 2018-03-01 DIAGNOSIS — M1A0790 Idiopathic chronic gout, unspecified ankle and foot, without tophus (tophi): Secondary | ICD-10-CM | POA: Diagnosis not present

## 2018-03-01 DIAGNOSIS — E782 Mixed hyperlipidemia: Secondary | ICD-10-CM | POA: Diagnosis not present

## 2018-03-01 DIAGNOSIS — E1165 Type 2 diabetes mellitus with hyperglycemia: Secondary | ICD-10-CM | POA: Diagnosis not present

## 2018-03-08 DIAGNOSIS — M1A0790 Idiopathic chronic gout, unspecified ankle and foot, without tophus (tophi): Secondary | ICD-10-CM | POA: Diagnosis not present

## 2018-03-08 DIAGNOSIS — E782 Mixed hyperlipidemia: Secondary | ICD-10-CM | POA: Diagnosis not present

## 2018-03-08 DIAGNOSIS — N183 Chronic kidney disease, stage 3 (moderate): Secondary | ICD-10-CM | POA: Diagnosis not present

## 2018-03-08 DIAGNOSIS — E1165 Type 2 diabetes mellitus with hyperglycemia: Secondary | ICD-10-CM | POA: Diagnosis not present

## 2018-03-08 DIAGNOSIS — I10 Essential (primary) hypertension: Secondary | ICD-10-CM | POA: Diagnosis not present

## 2018-03-08 DIAGNOSIS — Z23 Encounter for immunization: Secondary | ICD-10-CM | POA: Diagnosis not present

## 2018-03-26 DIAGNOSIS — C44329 Squamous cell carcinoma of skin of other parts of face: Secondary | ICD-10-CM | POA: Diagnosis not present

## 2018-03-26 DIAGNOSIS — D229 Melanocytic nevi, unspecified: Secondary | ICD-10-CM | POA: Diagnosis not present

## 2018-03-26 DIAGNOSIS — L918 Other hypertrophic disorders of the skin: Secondary | ICD-10-CM | POA: Diagnosis not present

## 2018-03-26 DIAGNOSIS — D485 Neoplasm of uncertain behavior of skin: Secondary | ICD-10-CM | POA: Diagnosis not present

## 2018-03-26 DIAGNOSIS — L821 Other seborrheic keratosis: Secondary | ICD-10-CM | POA: Diagnosis not present

## 2018-03-26 DIAGNOSIS — D1801 Hemangioma of skin and subcutaneous tissue: Secondary | ICD-10-CM | POA: Diagnosis not present

## 2018-03-26 DIAGNOSIS — L814 Other melanin hyperpigmentation: Secondary | ICD-10-CM | POA: Diagnosis not present

## 2018-08-09 DIAGNOSIS — Z85828 Personal history of other malignant neoplasm of skin: Secondary | ICD-10-CM | POA: Diagnosis not present

## 2018-08-09 DIAGNOSIS — L819 Disorder of pigmentation, unspecified: Secondary | ICD-10-CM | POA: Diagnosis not present

## 2018-08-17 DIAGNOSIS — I10 Essential (primary) hypertension: Secondary | ICD-10-CM | POA: Diagnosis not present

## 2018-08-17 DIAGNOSIS — E1165 Type 2 diabetes mellitus with hyperglycemia: Secondary | ICD-10-CM | POA: Diagnosis not present

## 2018-08-17 DIAGNOSIS — M1A0790 Idiopathic chronic gout, unspecified ankle and foot, without tophus (tophi): Secondary | ICD-10-CM | POA: Diagnosis not present

## 2018-08-17 DIAGNOSIS — E782 Mixed hyperlipidemia: Secondary | ICD-10-CM | POA: Diagnosis not present

## 2018-08-17 DIAGNOSIS — N183 Chronic kidney disease, stage 3 (moderate): Secondary | ICD-10-CM | POA: Diagnosis not present

## 2018-08-23 IMAGING — CT CT ABD-PELV W/O CM
2 of 4 series · 14 of 46 positions shown, 16 images · non-contrast
Comparison: CT abdomen pelvis 02/04/2015, 02/01/2015 and earlier.
MRI abdomen 02/02/2015.

CLINICAL DATA: 81-year-old with elevated serum liver function tests
and generalized itching. Surgical history includes cholecystectomy.
Acute superimposed upon chronic renal failure (creatinine 2.00,
estimated GFR 30) which precludes IV contrast.

EXAM:
CT ABDOMEN AND PELVIS WITHOUT CONTRAST
TECHNIQUE: Multidetector CT imaging of the abdomen and pelvis was performed
following the standard protocol without IV contrast. Oral contrast
was administered.

[Series 2: abd/pel w/o · axial · non-contrast · 0.82mm/px · z∈[+1167,+1602]mm · 11 of 99 slices shown, 13 images]
[im 6/99  soft-tissue]
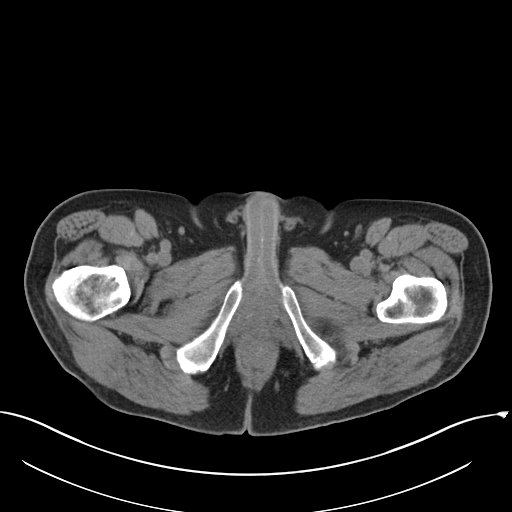
[im 6/99  bone]
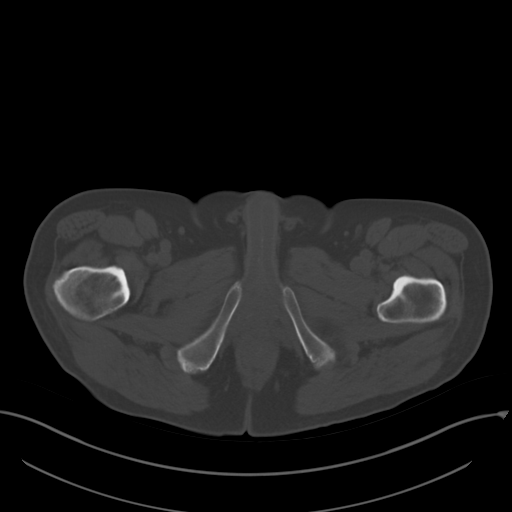
[im 16/99  soft-tissue]
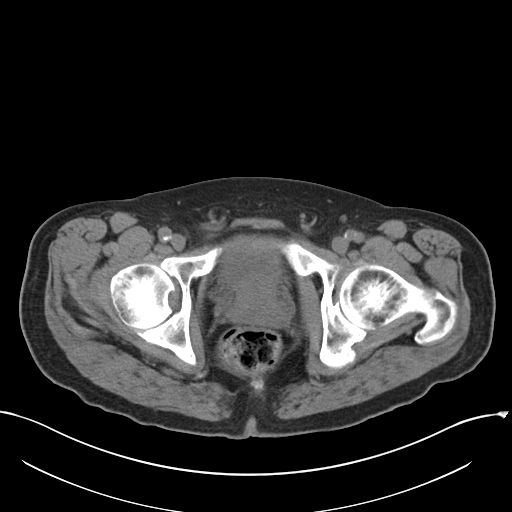
[im 26/99  soft-tissue]
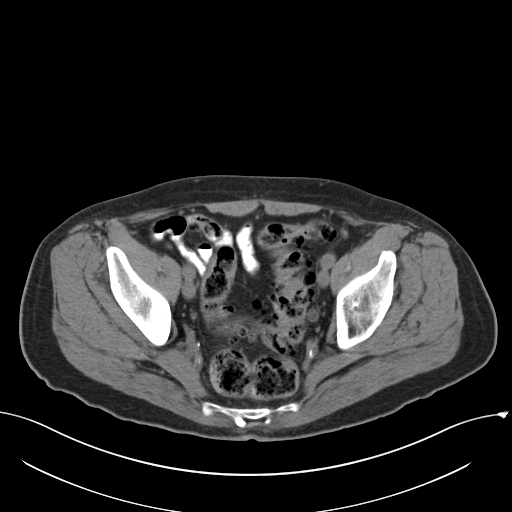
[im 31/99  soft-tissue]
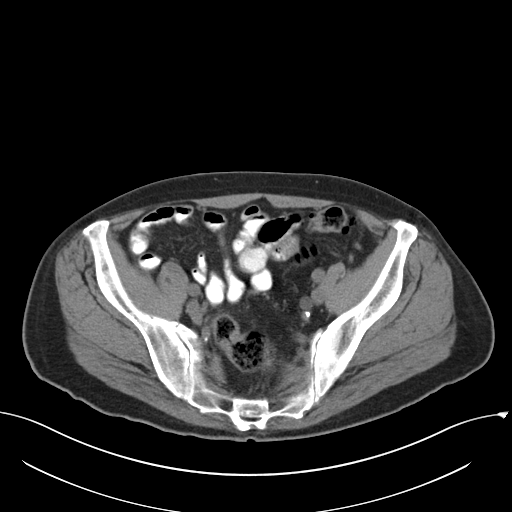
[im 42/99  soft-tissue]
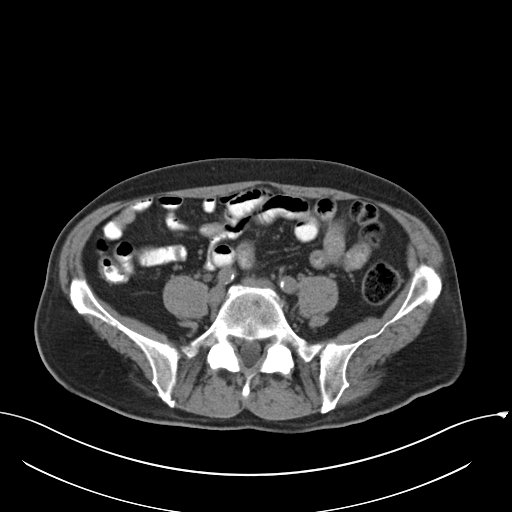
[im 52/99  soft-tissue]
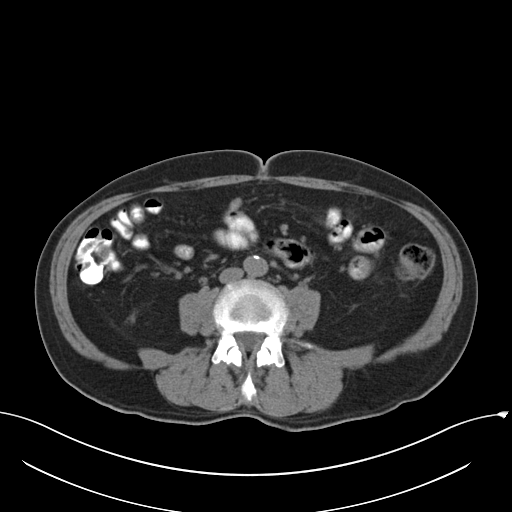
[im 57/99  soft-tissue]
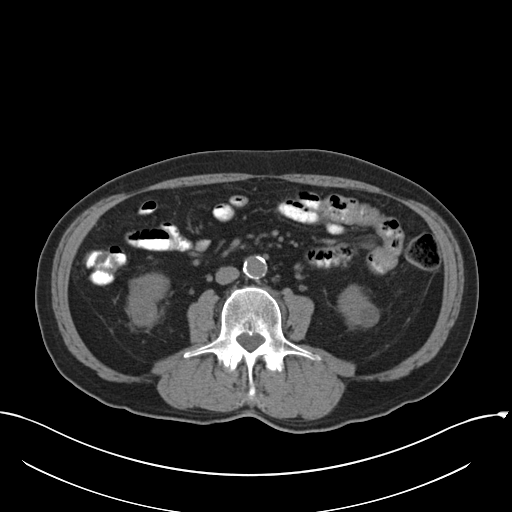
[im 68/99  soft-tissue]
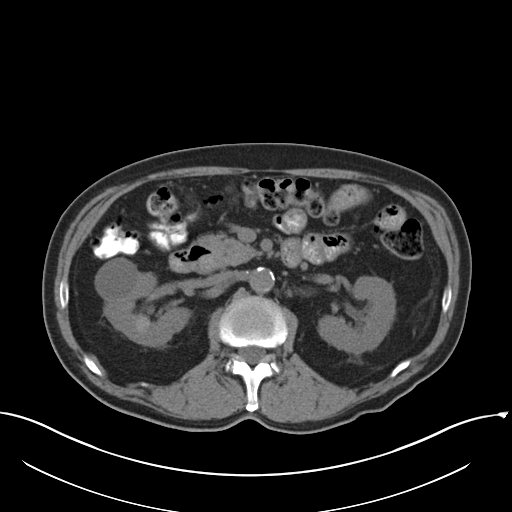
[im 73/99  soft-tissue]
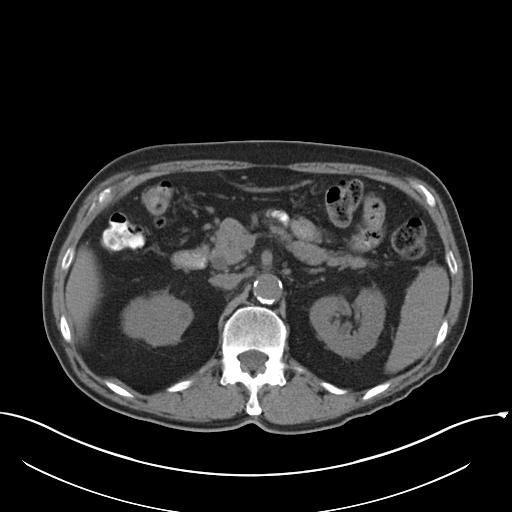
[im 73/99  bone]
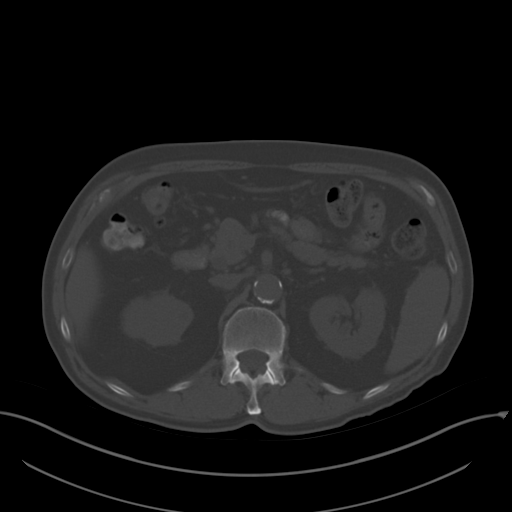
[im 83/99  soft-tissue]
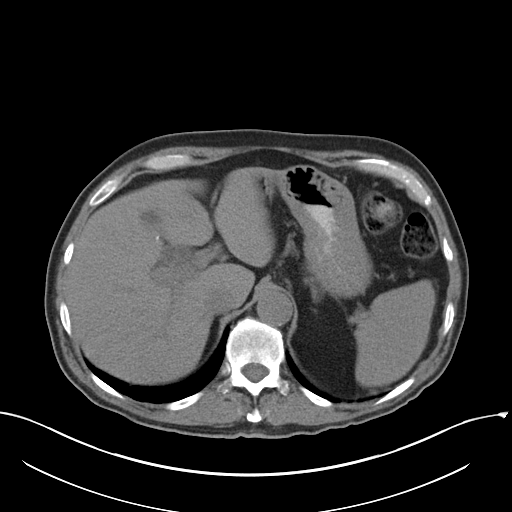
[im 93/99  soft-tissue]
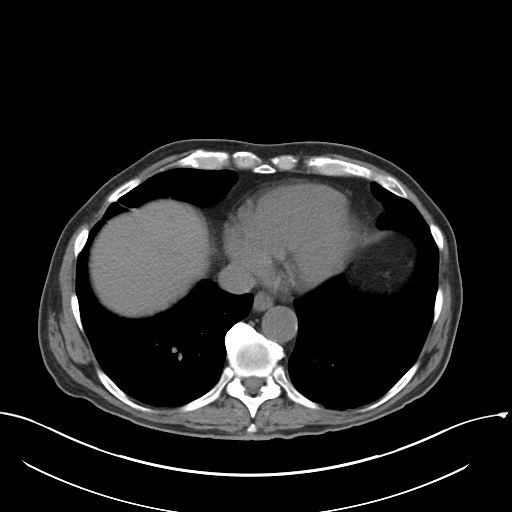

[Series 3: coronal · coronal · 0.83mm/px · 3 of 112 slices shown]
[im 38/112  soft-tissue]
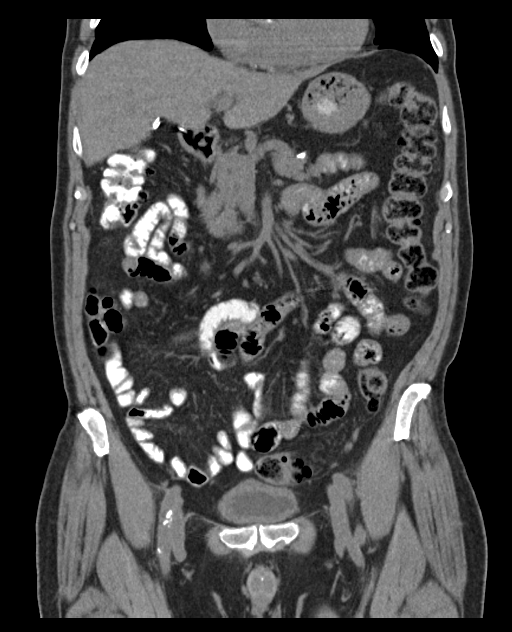
[im 50/112  soft-tissue]
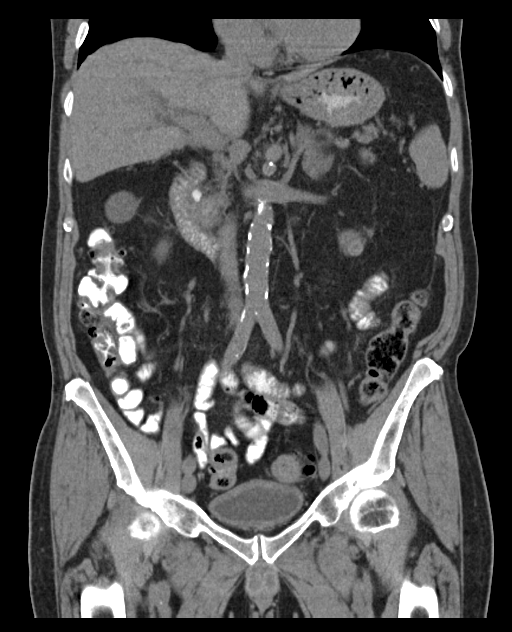
[im 62/112  soft-tissue]
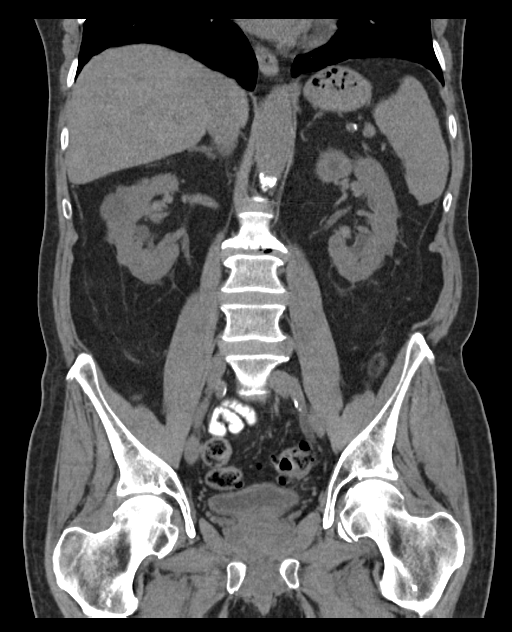

[14 of 46 positions shown; findings below may reference images not displayed]

FINDINGS: Lower chest: Minimal scarring in the right middle lobe, lingula and
lower lobes. Visualized lung bases otherwise clear. Heart upper
normal in size. Aortic valvular calcifications as noted previously.

Hepatobiliary: Normal unenhanced appearance of the liver. Surgically
absent gallbladder. Extrahepatic biliary ductal dilation up to 16
mm, not significantly changed since the prior CT from 9462. No
visible common bile duct stones. See below comments regarding
duodenal diverticulum.

Pancreas: Cystic mass containing a solitary calcification involving
the head of the pancreas, measuring approximately 1.4 x 1.2 x 1.1 cm
(series 2, image 25 and coronal image 35), slightly increased in
size since 9462. No evidence of pancreatic ductal dilation. No
masses elsewhere involving the pancreas. No peripancreatic
inflammation.

Spleen: Normal unenhanced appearance.

Adrenals/Urinary Tract: Normal appearing adrenal glands. Multiple
benign cortical cysts involving both kidneys, the largest measuring
approximately 4 cm arising from the upper pole the right kidney.
Likely benign exophytic complex cyst arising from the lower pole of
the left kidney anteriorly measuring approximately 1.0 x 1.2 x
cm, not reliably demonstrated on the prior CT or and not imaged on
the prior MRI. No hydronephrosis. No urinary tract calculi on either
side.

Decompressed urinary bladder with diffusely thickened bladder wall.

Stomach/Bowel: Stomach relatively decompressed and unremarkable.
Diverticulum arising from the second portion the duodenum, and the
common bile duct may enter the diverticulum or enters the duodenum
just distal to the diverticulum. Small bowel otherwise normal in
appearance. Diffuse colonic diverticulosis, most extensive in the
sigmoid colon, without evidence of acute diverticulitis. Moderate
colonic stool burden. Normal retrocecal appendix in the right upper
pelvis filling with oral contrast.

Vascular/Lymphatic: Severe aortoiliofemoral atherosclerosis without
evidence of aneurysm. No pathologic lymphadenopathy.

Reproductive: Moderate prostate gland enlargement, including the
median lobe. Normal seminal vesicles.

Other: None.

Musculoskeletal: Degenerative disc disease and spondylosis at L2-3.
DISH involving the lower thoracic spine. Facet degenerative changes
at L2-3, L4-5 and L5-S1. Degenerative changes involving the
sacroiliac joints. Mild degenerative changes involving both hips. No
acute osseous findings.
IMPRESSION: 1. No acute abnormalities involving the abdomen or pelvis.
2. Normal unenhanced appearance of the liver. If the liver function
tests remain abnormal, non-emergent right upper quadrant abdominal
ultrasound and hepatic elastography may be helpful in further
evaluation.
3. 1.2 cm cystic mass involving the head of the pancreas, minimally
increased in size since 9462. Given the fact the mass is still less
than 1.5 cm, follow-up CT abdomen in 1 year is recommended. This
recommendation follows ACR consensus guidelines: Management of
Incidental Pancreatic Cysts: A White Paper of the ACR Incidental
Findings Committee. [HOSPITAL] 9232;[DATE].
4. Numerous benign cortical cysts involving both kidneys. Likely
benign hemorrhagic or proteinaceous 1.2 cm cyst arising from the
lower pole of the left kidney. As this lesion was not visible on the
prior CT from 9462, follow-up abdominal CT in 3 months is
recommended to confirm stability.
5. Diverticulum arising from the medial aspect of the duodenum. It
appears that the common bile duct may enter the diverticulum or
enters just distal to the diverticulum.
6. Diffuse colonic diverticulosis without evidence of acute
diverticulitis.
7. Prostate gland enlargement, particularly the median lobe.
8. Generalized bladder wall thickening. This is likely due to
chronic outlet obstruction related to the prostatomegaly, the can be
seen in patients with cystitis.
9. Extrahepatic biliary ductal dilation up to 16 mm, unchanged since
9462, without evidence of obstructing stone or mass.
Aortic Atherosclerosis ([id number]).

## 2018-08-30 DIAGNOSIS — E1169 Type 2 diabetes mellitus with other specified complication: Secondary | ICD-10-CM | POA: Diagnosis not present

## 2018-08-30 DIAGNOSIS — E1121 Type 2 diabetes mellitus with diabetic nephropathy: Secondary | ICD-10-CM | POA: Diagnosis not present

## 2018-08-30 DIAGNOSIS — Z23 Encounter for immunization: Secondary | ICD-10-CM | POA: Diagnosis not present

## 2018-08-30 DIAGNOSIS — Z0000 Encounter for general adult medical examination without abnormal findings: Secondary | ICD-10-CM | POA: Diagnosis not present

## 2018-08-30 DIAGNOSIS — E1165 Type 2 diabetes mellitus with hyperglycemia: Secondary | ICD-10-CM | POA: Diagnosis not present

## 2018-08-30 DIAGNOSIS — I700 Atherosclerosis of aorta: Secondary | ICD-10-CM | POA: Diagnosis not present

## 2018-11-22 DIAGNOSIS — R1084 Generalized abdominal pain: Secondary | ICD-10-CM | POA: Diagnosis not present

## 2018-11-22 DIAGNOSIS — K567 Ileus, unspecified: Secondary | ICD-10-CM | POA: Diagnosis not present

## 2018-11-22 DIAGNOSIS — R197 Diarrhea, unspecified: Secondary | ICD-10-CM | POA: Diagnosis not present

## 2018-11-22 DIAGNOSIS — N390 Urinary tract infection, site not specified: Secondary | ICD-10-CM | POA: Diagnosis not present

## 2018-11-22 DIAGNOSIS — R110 Nausea: Secondary | ICD-10-CM | POA: Diagnosis not present

## 2018-11-22 DIAGNOSIS — K649 Unspecified hemorrhoids: Secondary | ICD-10-CM | POA: Diagnosis not present

## 2019-01-03 DIAGNOSIS — E1165 Type 2 diabetes mellitus with hyperglycemia: Secondary | ICD-10-CM | POA: Diagnosis not present

## 2019-01-03 DIAGNOSIS — E782 Mixed hyperlipidemia: Secondary | ICD-10-CM | POA: Diagnosis not present

## 2019-01-03 DIAGNOSIS — E1121 Type 2 diabetes mellitus with diabetic nephropathy: Secondary | ICD-10-CM | POA: Diagnosis not present

## 2019-01-10 DIAGNOSIS — Z7189 Other specified counseling: Secondary | ICD-10-CM | POA: Diagnosis not present

## 2019-01-10 DIAGNOSIS — E1165 Type 2 diabetes mellitus with hyperglycemia: Secondary | ICD-10-CM | POA: Diagnosis not present

## 2019-01-10 DIAGNOSIS — I129 Hypertensive chronic kidney disease with stage 1 through stage 4 chronic kidney disease, or unspecified chronic kidney disease: Secondary | ICD-10-CM | POA: Diagnosis not present

## 2019-01-10 DIAGNOSIS — E782 Mixed hyperlipidemia: Secondary | ICD-10-CM | POA: Diagnosis not present

## 2019-01-26 DIAGNOSIS — E119 Type 2 diabetes mellitus without complications: Secondary | ICD-10-CM | POA: Diagnosis not present

## 2019-01-26 DIAGNOSIS — H2513 Age-related nuclear cataract, bilateral: Secondary | ICD-10-CM | POA: Diagnosis not present

## 2019-01-26 DIAGNOSIS — H5203 Hypermetropia, bilateral: Secondary | ICD-10-CM | POA: Diagnosis not present

## 2019-02-07 DIAGNOSIS — L819 Disorder of pigmentation, unspecified: Secondary | ICD-10-CM | POA: Diagnosis not present

## 2019-02-07 DIAGNOSIS — D1801 Hemangioma of skin and subcutaneous tissue: Secondary | ICD-10-CM | POA: Diagnosis not present

## 2019-02-07 DIAGNOSIS — L821 Other seborrheic keratosis: Secondary | ICD-10-CM | POA: Diagnosis not present

## 2019-02-07 DIAGNOSIS — D229 Melanocytic nevi, unspecified: Secondary | ICD-10-CM | POA: Diagnosis not present

## 2019-02-07 DIAGNOSIS — L570 Actinic keratosis: Secondary | ICD-10-CM | POA: Diagnosis not present

## 2019-03-21 DIAGNOSIS — Z23 Encounter for immunization: Secondary | ICD-10-CM | POA: Diagnosis not present

## 2019-05-23 DIAGNOSIS — E782 Mixed hyperlipidemia: Secondary | ICD-10-CM | POA: Diagnosis not present

## 2019-05-23 DIAGNOSIS — E1165 Type 2 diabetes mellitus with hyperglycemia: Secondary | ICD-10-CM | POA: Diagnosis not present

## 2019-05-23 DIAGNOSIS — I129 Hypertensive chronic kidney disease with stage 1 through stage 4 chronic kidney disease, or unspecified chronic kidney disease: Secondary | ICD-10-CM | POA: Diagnosis not present

## 2019-06-06 DIAGNOSIS — E782 Mixed hyperlipidemia: Secondary | ICD-10-CM | POA: Diagnosis not present

## 2019-06-06 DIAGNOSIS — I10 Essential (primary) hypertension: Secondary | ICD-10-CM | POA: Diagnosis not present

## 2019-06-06 DIAGNOSIS — E1169 Type 2 diabetes mellitus with other specified complication: Secondary | ICD-10-CM | POA: Diagnosis not present

## 2019-06-06 DIAGNOSIS — I129 Hypertensive chronic kidney disease with stage 1 through stage 4 chronic kidney disease, or unspecified chronic kidney disease: Secondary | ICD-10-CM | POA: Diagnosis not present

## 2019-06-06 DIAGNOSIS — E1165 Type 2 diabetes mellitus with hyperglycemia: Secondary | ICD-10-CM | POA: Diagnosis not present

## 2019-07-15 ENCOUNTER — Ambulatory Visit: Payer: Medicare Other | Attending: Internal Medicine

## 2019-07-15 DIAGNOSIS — Z23 Encounter for immunization: Secondary | ICD-10-CM | POA: Insufficient documentation

## 2019-07-15 NOTE — Progress Notes (Signed)
   Covid-19 Vaccination Clinic  Name:  FREDDRICK KONOW    MRN: [id number] DOB: May 29, 1936  07/15/2019  Mr. Mcevers was observed post Covid-19 immunization for 15 minutes without incidence. He was provided with Vaccine Information Sheet and instruction to access the V-Safe system.   Mr. Christin was instructed to call 911 with any severe reactions post vaccine: Marland Kitchen Difficulty breathing  . Swelling of your face and throat  . A fast heartbeat  . A bad rash all over your body  . Dizziness and weakness    Immunizations Administered    Name Date Dose VIS Date Route   Pfizer COVID-19 Vaccine 07/15/2019  7:08 PM 0.3 mL 06/03/2019 Intramuscular   Manufacturer: [place]   Lot: [id number]   [place]: [id number]

## 2019-08-03 ENCOUNTER — Ambulatory Visit: Payer: Medicare Other | Attending: Internal Medicine

## 2019-08-03 DIAGNOSIS — Z23 Encounter for immunization: Secondary | ICD-10-CM

## 2019-08-03 NOTE — Progress Notes (Signed)
   Covid-19 Vaccination Clinic  Name:  Jesse Hoffman    MRN: [id number] DOB: [birth year]  08/03/2019  [name] was observed post Covid-19 immunization for 15 minutes without incidence. He was provided with Vaccine Information Sheet and instruction to access the V-Safe system.   [name] was instructed to call 911 with any severe reactions post vaccine: Marland Kitchen Difficulty breathing  . Swelling of your face and throat  . A fast heartbeat  . A bad rash all over your body  . Dizziness and weakness    Immunizations Administered    Name Date Dose VIS Date Route   Pfizer COVID-19 Vaccine 08/03/2019 12:47 PM 0.3 mL 06/03/2019 Intramuscular   Manufacturer: [place]   Lot: [id number]   [place]: [id number]

## 2019-08-04 DIAGNOSIS — E119 Type 2 diabetes mellitus without complications: Secondary | ICD-10-CM | POA: Diagnosis not present

## 2019-08-04 DIAGNOSIS — H52203 Unspecified astigmatism, bilateral: Secondary | ICD-10-CM | POA: Diagnosis not present

## 2019-08-04 DIAGNOSIS — H43813 Vitreous degeneration, bilateral: Secondary | ICD-10-CM | POA: Diagnosis not present

## 2019-08-04 DIAGNOSIS — H25813 Combined forms of age-related cataract, bilateral: Secondary | ICD-10-CM | POA: Diagnosis not present

## 2019-08-05 ENCOUNTER — Ambulatory Visit: Payer: Medicare Other

## 2019-08-09 DIAGNOSIS — L821 Other seborrheic keratosis: Secondary | ICD-10-CM | POA: Diagnosis not present

## 2019-08-09 DIAGNOSIS — Z85828 Personal history of other malignant neoplasm of skin: Secondary | ICD-10-CM | POA: Diagnosis not present

## 2019-08-09 DIAGNOSIS — L905 Scar conditions and fibrosis of skin: Secondary | ICD-10-CM | POA: Diagnosis not present

## 2019-08-09 DIAGNOSIS — L570 Actinic keratosis: Secondary | ICD-10-CM | POA: Diagnosis not present

## 2019-08-09 DIAGNOSIS — L819 Disorder of pigmentation, unspecified: Secondary | ICD-10-CM | POA: Diagnosis not present

## 2019-08-16 DIAGNOSIS — H25812 Combined forms of age-related cataract, left eye: Secondary | ICD-10-CM | POA: Diagnosis not present

## 2019-08-16 DIAGNOSIS — H268 Other specified cataract: Secondary | ICD-10-CM | POA: Diagnosis not present

## 2019-08-16 DIAGNOSIS — H21562 Pupillary abnormality, left eye: Secondary | ICD-10-CM | POA: Diagnosis not present

## 2019-09-06 DIAGNOSIS — H21561 Pupillary abnormality, right eye: Secondary | ICD-10-CM | POA: Diagnosis not present

## 2019-09-06 DIAGNOSIS — H25811 Combined forms of age-related cataract, right eye: Secondary | ICD-10-CM | POA: Diagnosis not present

## 2019-09-06 DIAGNOSIS — H268 Other specified cataract: Secondary | ICD-10-CM | POA: Diagnosis not present

## 2019-09-26 DIAGNOSIS — N183 Chronic kidney disease, stage 3 unspecified: Secondary | ICD-10-CM | POA: Diagnosis not present

## 2019-09-26 DIAGNOSIS — I10 Essential (primary) hypertension: Secondary | ICD-10-CM | POA: Diagnosis not present

## 2019-09-26 DIAGNOSIS — E782 Mixed hyperlipidemia: Secondary | ICD-10-CM | POA: Diagnosis not present

## 2019-09-26 DIAGNOSIS — E1165 Type 2 diabetes mellitus with hyperglycemia: Secondary | ICD-10-CM | POA: Diagnosis not present

## 2019-09-26 DIAGNOSIS — E1169 Type 2 diabetes mellitus with other specified complication: Secondary | ICD-10-CM | POA: Diagnosis not present

## 2019-09-26 DIAGNOSIS — I129 Hypertensive chronic kidney disease with stage 1 through stage 4 chronic kidney disease, or unspecified chronic kidney disease: Secondary | ICD-10-CM | POA: Diagnosis not present

## 2019-10-03 DIAGNOSIS — E1121 Type 2 diabetes mellitus with diabetic nephropathy: Secondary | ICD-10-CM | POA: Diagnosis not present

## 2019-10-03 DIAGNOSIS — Z0000 Encounter for general adult medical examination without abnormal findings: Secondary | ICD-10-CM | POA: Diagnosis not present

## 2019-10-03 DIAGNOSIS — I700 Atherosclerosis of aorta: Secondary | ICD-10-CM | POA: Diagnosis not present

## 2019-10-03 DIAGNOSIS — E1165 Type 2 diabetes mellitus with hyperglycemia: Secondary | ICD-10-CM | POA: Diagnosis not present

## 2019-10-03 DIAGNOSIS — E1169 Type 2 diabetes mellitus with other specified complication: Secondary | ICD-10-CM | POA: Diagnosis not present

## 2019-10-12 DIAGNOSIS — Z961 Presence of intraocular lens: Secondary | ICD-10-CM | POA: Diagnosis not present

## 2020-01-09 DIAGNOSIS — E1169 Type 2 diabetes mellitus with other specified complication: Secondary | ICD-10-CM | POA: Diagnosis not present

## 2020-01-09 DIAGNOSIS — E782 Mixed hyperlipidemia: Secondary | ICD-10-CM | POA: Diagnosis not present

## 2020-01-09 DIAGNOSIS — E1121 Type 2 diabetes mellitus with diabetic nephropathy: Secondary | ICD-10-CM | POA: Diagnosis not present

## 2020-01-19 DIAGNOSIS — I129 Hypertensive chronic kidney disease with stage 1 through stage 4 chronic kidney disease, or unspecified chronic kidney disease: Secondary | ICD-10-CM | POA: Diagnosis not present

## 2020-01-19 DIAGNOSIS — M5412 Radiculopathy, cervical region: Secondary | ICD-10-CM | POA: Diagnosis not present

## 2020-01-19 DIAGNOSIS — E1165 Type 2 diabetes mellitus with hyperglycemia: Secondary | ICD-10-CM | POA: Diagnosis not present

## 2020-01-19 DIAGNOSIS — E782 Mixed hyperlipidemia: Secondary | ICD-10-CM | POA: Diagnosis not present

## 2020-01-19 DIAGNOSIS — M542 Cervicalgia: Secondary | ICD-10-CM | POA: Diagnosis not present

## 2020-02-06 DIAGNOSIS — M542 Cervicalgia: Secondary | ICD-10-CM | POA: Diagnosis not present

## 2020-02-06 DIAGNOSIS — L821 Other seborrheic keratosis: Secondary | ICD-10-CM | POA: Diagnosis not present

## 2020-02-06 DIAGNOSIS — L814 Other melanin hyperpigmentation: Secondary | ICD-10-CM | POA: Diagnosis not present

## 2020-02-06 DIAGNOSIS — D225 Melanocytic nevi of trunk: Secondary | ICD-10-CM | POA: Diagnosis not present

## 2020-02-06 DIAGNOSIS — M5412 Radiculopathy, cervical region: Secondary | ICD-10-CM | POA: Diagnosis not present

## 2020-02-06 DIAGNOSIS — Z85828 Personal history of other malignant neoplasm of skin: Secondary | ICD-10-CM | POA: Diagnosis not present

## 2020-02-06 DIAGNOSIS — L723 Sebaceous cyst: Secondary | ICD-10-CM | POA: Diagnosis not present

## 2020-02-10 DIAGNOSIS — U071 COVID-19: Secondary | ICD-10-CM | POA: Diagnosis not present

## 2020-02-10 DIAGNOSIS — Z20822 Contact with and (suspected) exposure to covid-19: Secondary | ICD-10-CM | POA: Diagnosis not present

## 2020-02-22 DIAGNOSIS — Z20822 Contact with and (suspected) exposure to covid-19: Secondary | ICD-10-CM | POA: Diagnosis not present

## 2020-03-07 DIAGNOSIS — M5412 Radiculopathy, cervical region: Secondary | ICD-10-CM | POA: Diagnosis not present

## 2020-03-07 DIAGNOSIS — M25511 Pain in right shoulder: Secondary | ICD-10-CM | POA: Diagnosis not present

## 2020-03-14 DIAGNOSIS — M25511 Pain in right shoulder: Secondary | ICD-10-CM | POA: Diagnosis not present

## 2020-03-14 DIAGNOSIS — M5412 Radiculopathy, cervical region: Secondary | ICD-10-CM | POA: Diagnosis not present

## 2020-03-16 DIAGNOSIS — M25511 Pain in right shoulder: Secondary | ICD-10-CM | POA: Diagnosis not present

## 2020-03-16 DIAGNOSIS — M5412 Radiculopathy, cervical region: Secondary | ICD-10-CM | POA: Diagnosis not present

## 2020-03-19 DIAGNOSIS — M5412 Radiculopathy, cervical region: Secondary | ICD-10-CM | POA: Diagnosis not present

## 2020-03-19 DIAGNOSIS — M25511 Pain in right shoulder: Secondary | ICD-10-CM | POA: Diagnosis not present

## 2020-03-20 DIAGNOSIS — Z23 Encounter for immunization: Secondary | ICD-10-CM | POA: Diagnosis not present

## 2020-03-29 DIAGNOSIS — M25511 Pain in right shoulder: Secondary | ICD-10-CM | POA: Diagnosis not present

## 2020-03-29 DIAGNOSIS — M5412 Radiculopathy, cervical region: Secondary | ICD-10-CM | POA: Diagnosis not present

## 2020-04-02 DIAGNOSIS — M25511 Pain in right shoulder: Secondary | ICD-10-CM | POA: Diagnosis not present

## 2020-04-02 DIAGNOSIS — M5412 Radiculopathy, cervical region: Secondary | ICD-10-CM | POA: Diagnosis not present

## 2020-04-09 DIAGNOSIS — M5412 Radiculopathy, cervical region: Secondary | ICD-10-CM | POA: Diagnosis not present

## 2020-04-09 DIAGNOSIS — M25511 Pain in right shoulder: Secondary | ICD-10-CM | POA: Diagnosis not present

## 2020-04-12 DIAGNOSIS — M5412 Radiculopathy, cervical region: Secondary | ICD-10-CM | POA: Diagnosis not present

## 2020-04-12 DIAGNOSIS — M25511 Pain in right shoulder: Secondary | ICD-10-CM | POA: Diagnosis not present

## 2020-04-16 DIAGNOSIS — M25511 Pain in right shoulder: Secondary | ICD-10-CM | POA: Diagnosis not present

## 2020-04-16 DIAGNOSIS — M5412 Radiculopathy, cervical region: Secondary | ICD-10-CM | POA: Diagnosis not present

## 2020-04-19 DIAGNOSIS — M5412 Radiculopathy, cervical region: Secondary | ICD-10-CM | POA: Diagnosis not present

## 2020-04-19 DIAGNOSIS — M25511 Pain in right shoulder: Secondary | ICD-10-CM | POA: Diagnosis not present

## 2020-04-27 DIAGNOSIS — M25511 Pain in right shoulder: Secondary | ICD-10-CM | POA: Diagnosis not present

## 2020-04-27 DIAGNOSIS — M5412 Radiculopathy, cervical region: Secondary | ICD-10-CM | POA: Diagnosis not present

## 2020-05-01 DIAGNOSIS — M5412 Radiculopathy, cervical region: Secondary | ICD-10-CM | POA: Diagnosis not present

## 2020-05-01 DIAGNOSIS — M25511 Pain in right shoulder: Secondary | ICD-10-CM | POA: Diagnosis not present

## 2020-05-31 DIAGNOSIS — E1165 Type 2 diabetes mellitus with hyperglycemia: Secondary | ICD-10-CM | POA: Diagnosis not present

## 2020-05-31 DIAGNOSIS — I129 Hypertensive chronic kidney disease with stage 1 through stage 4 chronic kidney disease, or unspecified chronic kidney disease: Secondary | ICD-10-CM | POA: Diagnosis not present

## 2020-05-31 DIAGNOSIS — E782 Mixed hyperlipidemia: Secondary | ICD-10-CM | POA: Diagnosis not present

## 2020-06-07 DIAGNOSIS — E782 Mixed hyperlipidemia: Secondary | ICD-10-CM | POA: Diagnosis not present

## 2020-06-07 DIAGNOSIS — M5412 Radiculopathy, cervical region: Secondary | ICD-10-CM | POA: Diagnosis not present

## 2020-06-07 DIAGNOSIS — M542 Cervicalgia: Secondary | ICD-10-CM | POA: Diagnosis not present

## 2020-06-07 DIAGNOSIS — N1831 Chronic kidney disease, stage 3a: Secondary | ICD-10-CM | POA: Diagnosis not present

## 2020-06-07 DIAGNOSIS — E1165 Type 2 diabetes mellitus with hyperglycemia: Secondary | ICD-10-CM | POA: Diagnosis not present

## 2020-06-08 ENCOUNTER — Other Ambulatory Visit: Payer: Self-pay | Admitting: Internal Medicine

## 2020-06-08 DIAGNOSIS — M5412 Radiculopathy, cervical region: Secondary | ICD-10-CM

## 2020-07-03 ENCOUNTER — Ambulatory Visit
Admission: RE | Admit: 2020-07-03 | Discharge: 2020-07-03 | Disposition: A | Discharge status: Home / Self Care | Payer: Medicare Other | Source: Ambulatory Visit | Attending: Internal Medicine | Admitting: Internal Medicine

## 2020-07-03 DIAGNOSIS — M4802 Spinal stenosis, cervical region: Secondary | ICD-10-CM | POA: Diagnosis not present

## 2020-07-03 DIAGNOSIS — M5412 Radiculopathy, cervical region: Secondary | ICD-10-CM

## 2020-08-08 DIAGNOSIS — D1801 Hemangioma of skin and subcutaneous tissue: Secondary | ICD-10-CM | POA: Diagnosis not present

## 2020-08-08 DIAGNOSIS — L218 Other seborrheic dermatitis: Secondary | ICD-10-CM | POA: Diagnosis not present

## 2020-08-08 DIAGNOSIS — L905 Scar conditions and fibrosis of skin: Secondary | ICD-10-CM | POA: Diagnosis not present

## 2020-08-08 DIAGNOSIS — R202 Paresthesia of skin: Secondary | ICD-10-CM | POA: Diagnosis not present

## 2020-08-08 DIAGNOSIS — Z85828 Personal history of other malignant neoplasm of skin: Secondary | ICD-10-CM | POA: Diagnosis not present

## 2020-08-08 DIAGNOSIS — D229 Melanocytic nevi, unspecified: Secondary | ICD-10-CM | POA: Diagnosis not present

## 2020-08-09 DIAGNOSIS — M5412 Radiculopathy, cervical region: Secondary | ICD-10-CM | POA: Diagnosis not present

## 2020-08-09 DIAGNOSIS — R030 Elevated blood-pressure reading, without diagnosis of hypertension: Secondary | ICD-10-CM | POA: Diagnosis not present

## 2020-08-21 DIAGNOSIS — M5412 Radiculopathy, cervical region: Secondary | ICD-10-CM | POA: Diagnosis not present

## 2020-09-10 DIAGNOSIS — M5412 Radiculopathy, cervical region: Secondary | ICD-10-CM | POA: Diagnosis not present

## 2020-09-28 DIAGNOSIS — E782 Mixed hyperlipidemia: Secondary | ICD-10-CM | POA: Diagnosis not present

## 2020-09-28 DIAGNOSIS — Z0000 Encounter for general adult medical examination without abnormal findings: Secondary | ICD-10-CM | POA: Diagnosis not present

## 2020-09-28 DIAGNOSIS — E1165 Type 2 diabetes mellitus with hyperglycemia: Secondary | ICD-10-CM | POA: Diagnosis not present

## 2020-09-28 DIAGNOSIS — I700 Atherosclerosis of aorta: Secondary | ICD-10-CM | POA: Diagnosis not present

## 2020-09-28 DIAGNOSIS — N1831 Chronic kidney disease, stage 3a: Secondary | ICD-10-CM | POA: Diagnosis not present

## 2020-10-04 DIAGNOSIS — R5383 Other fatigue: Secondary | ICD-10-CM | POA: Diagnosis not present

## 2020-10-04 DIAGNOSIS — J309 Allergic rhinitis, unspecified: Secondary | ICD-10-CM | POA: Diagnosis not present

## 2020-10-04 DIAGNOSIS — R7989 Other specified abnormal findings of blood chemistry: Secondary | ICD-10-CM | POA: Diagnosis not present

## 2020-10-04 DIAGNOSIS — E1121 Type 2 diabetes mellitus with diabetic nephropathy: Secondary | ICD-10-CM | POA: Diagnosis not present

## 2020-10-08 DIAGNOSIS — R5383 Other fatigue: Secondary | ICD-10-CM | POA: Diagnosis not present

## 2020-10-08 DIAGNOSIS — I129 Hypertensive chronic kidney disease with stage 1 through stage 4 chronic kidney disease, or unspecified chronic kidney disease: Secondary | ICD-10-CM | POA: Diagnosis not present

## 2020-10-08 DIAGNOSIS — N1831 Chronic kidney disease, stage 3a: Secondary | ICD-10-CM | POA: Diagnosis not present

## 2020-10-08 DIAGNOSIS — I499 Cardiac arrhythmia, unspecified: Secondary | ICD-10-CM | POA: Diagnosis not present

## 2020-10-16 DIAGNOSIS — M542 Cervicalgia: Secondary | ICD-10-CM | POA: Diagnosis not present

## 2020-10-16 DIAGNOSIS — M5412 Radiculopathy, cervical region: Secondary | ICD-10-CM | POA: Diagnosis not present

## 2020-10-18 DIAGNOSIS — I10 Essential (primary) hypertension: Secondary | ICD-10-CM | POA: Diagnosis not present

## 2020-10-18 DIAGNOSIS — E1165 Type 2 diabetes mellitus with hyperglycemia: Secondary | ICD-10-CM | POA: Diagnosis not present

## 2020-10-18 DIAGNOSIS — N1831 Chronic kidney disease, stage 3a: Secondary | ICD-10-CM | POA: Diagnosis not present

## 2020-10-18 DIAGNOSIS — I700 Atherosclerosis of aorta: Secondary | ICD-10-CM | POA: Diagnosis not present

## 2020-10-18 DIAGNOSIS — E782 Mixed hyperlipidemia: Secondary | ICD-10-CM | POA: Diagnosis not present

## 2020-10-18 DIAGNOSIS — Z0000 Encounter for general adult medical examination without abnormal findings: Secondary | ICD-10-CM | POA: Diagnosis not present

## 2020-10-18 DIAGNOSIS — M1A0790 Idiopathic chronic gout, unspecified ankle and foot, without tophus (tophi): Secondary | ICD-10-CM | POA: Diagnosis not present

## 2020-10-26 ENCOUNTER — Other Ambulatory Visit: Payer: Self-pay

## 2020-10-26 ENCOUNTER — Emergency Department (HOSPITAL_COMMUNITY)
Admission: EM | Admit: 2020-10-26 | Discharge: 2020-10-26 | Disposition: A | Discharge status: Home / Self Care | Payer: Medicare Other | Attending: Emergency Medicine | Admitting: Emergency Medicine

## 2020-10-26 ENCOUNTER — Encounter (HOSPITAL_COMMUNITY): Payer: Self-pay

## 2020-10-26 DIAGNOSIS — Z7984 Long term (current) use of oral hypoglycemic drugs: Secondary | ICD-10-CM | POA: Diagnosis not present

## 2020-10-26 DIAGNOSIS — E1122 Type 2 diabetes mellitus with diabetic chronic kidney disease: Secondary | ICD-10-CM | POA: Diagnosis not present

## 2020-10-26 DIAGNOSIS — Z79899 Other long term (current) drug therapy: Secondary | ICD-10-CM | POA: Insufficient documentation

## 2020-10-26 DIAGNOSIS — I129 Hypertensive chronic kidney disease with stage 1 through stage 4 chronic kidney disease, or unspecified chronic kidney disease: Secondary | ICD-10-CM | POA: Insufficient documentation

## 2020-10-26 DIAGNOSIS — N183 Chronic kidney disease, stage 3 unspecified: Secondary | ICD-10-CM | POA: Insufficient documentation

## 2020-10-26 DIAGNOSIS — E871 Hypo-osmolality and hyponatremia: Secondary | ICD-10-CM | POA: Insufficient documentation

## 2020-10-26 DIAGNOSIS — Z87891 Personal history of nicotine dependence: Secondary | ICD-10-CM | POA: Diagnosis not present

## 2020-10-26 DIAGNOSIS — R5383 Other fatigue: Secondary | ICD-10-CM | POA: Diagnosis present

## 2020-10-26 LAB — CBC WITH DIFFERENTIAL/PLATELET
Abs Immature Granulocytes: 0.01 10*3/uL (ref 0.00–0.07)
Basophils Absolute: 0 10*3/uL (ref 0.0–0.1)
Basophils Relative: 0 %
Eosinophils Absolute: 0.1 10*3/uL (ref 0.0–0.5)
Eosinophils Relative: 2 %
HCT: 32.9 % — ABNORMAL LOW (ref 39.0–52.0)
Hemoglobin: 11.9 g/dL — ABNORMAL LOW (ref 13.0–17.0)
Immature Granulocytes: 0 %
Lymphocytes Relative: 21 %
Lymphs Abs: 1 10*3/uL (ref 0.7–4.0)
MCH: 31.6 pg (ref 26.0–34.0)
MCHC: 36.2 g/dL — ABNORMAL HIGH (ref 30.0–36.0)
MCV: 87.3 fL (ref 80.0–100.0)
Monocytes Absolute: 0.6 10*3/uL (ref 0.1–1.0)
Monocytes Relative: 12 %
Neutro Abs: 3.1 10*3/uL (ref 1.7–7.7)
Neutrophils Relative %: 65 %
Platelets: 205 10*3/uL (ref 150–400)
RBC: 3.77 MIL/uL — ABNORMAL LOW (ref 4.22–5.81)
RDW: 13 % (ref 11.5–15.5)
WBC: 4.8 10*3/uL (ref 4.0–10.5)
nRBC: 0 % (ref 0.0–0.2)

## 2020-10-26 LAB — COMPREHENSIVE METABOLIC PANEL
ALT: 20 U/L (ref 0–44)
AST: 22 U/L (ref 15–41)
Albumin: 4 g/dL (ref 3.5–5.0)
Alkaline Phosphatase: 97 U/L (ref 38–126)
Anion gap: 12 (ref 5–15)
BUN: 20 mg/dL (ref 8–23)
CO2: 23 mmol/L (ref 22–32)
Calcium: 9.4 mg/dL (ref 8.9–10.3)
Chloride: 94 mmol/L — ABNORMAL LOW (ref 98–111)
Creatinine, Ser: 1.43 mg/dL — ABNORMAL HIGH (ref 0.61–1.24)
GFR, Estimated: 49 mL/min — ABNORMAL LOW (ref >60)
Glucose, Bld: 157 mg/dL — ABNORMAL HIGH (ref 70–99)
Potassium: 3.7 mmol/L (ref 3.5–5.1)
Sodium: 129 mmol/L — ABNORMAL LOW (ref 135–145)
Total Bilirubin: 1.6 mg/dL — ABNORMAL HIGH (ref 0.3–1.2)
Total Protein: 7.1 g/dL (ref 6.5–8.1)

## 2020-10-26 LAB — URINALYSIS, ROUTINE W REFLEX MICROSCOPIC
Bacteria, UA: NONE SEEN
Bilirubin Urine: NEGATIVE
Glucose, UA: >=500 mg/dL — AB
Hgb urine dipstick: NEGATIVE
Ketones, ur: NEGATIVE mg/dL
Leukocytes,Ua: NEGATIVE
Nitrite: NEGATIVE
Protein, ur: 30 mg/dL — AB
Specific Gravity, Urine: 1.003 — ABNORMAL LOW (ref 1.005–1.030)
pH: 7 (ref 5.0–8.0)

## 2020-10-26 LAB — LIPASE, BLOOD: Lipase: 80 U/L — ABNORMAL HIGH (ref 11–51)

## 2020-10-26 LAB — CBG MONITORING, ED: Glucose-Capillary: 159 mg/dL — ABNORMAL HIGH (ref 70–99)

## 2020-10-26 NOTE — ED Notes (Signed)
Pt being discharged, gave pt crackers on way out, pt ambulated independently, steady gait, A&O X4, wife w patient

## 2020-10-26 NOTE — ED Triage Notes (Addendum)
Patient was sent for abnormal lab Na+-125. Patient c/o fatigue x "a few weeks."

## 2020-10-26 NOTE — ED Provider Notes (Signed)
[facility] EMERGENCY DEPARTMENT Provider Note  CSN: [id number] Arrival date & time: 10/26/20 1333    History Chief Complaint  Patient presents with  . Abnormal Lab  . Fatigue    HPI  Jesse Hoffman is a 85 y.o. male with history of diabetes who had been well controlled on Metformin until about 2 weeks ago when he went to see his doctor and his glucose was in the 160s. He was started on rapaglinide and faxiga. He also has been much more conscientious about his diet, cutting out most carbs and drinking around 64oz of water a day. He went back to his doctor today because he was urinating frequently and was concerned he may have a UTI. He also had blood work done while he was there and was called this afternoon by nurse at his doctor's office and advised to come to the ED for a 'dangerously low sodium' which he thinks was about 125. He has had general fatigue recently but otherwise has been feeling well. Denies any headache, blurry vision, nausea, vomiting. He has had polyuria and polydipsia.    Past Medical History:  Diagnosis Date  . Ascending cholangitis 2006, 01/2015   2016: in setting of choledocholithiasis.  s/p ERCP/sphinct/stone extraction  . Cholangitis 01/2015  . Cholecystitis, acute with cholelithiasis 2006  . CKD (chronic kidney disease), stage III ([place])   . Diabetes mellitus without complication ([place])   . GERD (gastroesophageal reflux disease)   . Gout July 2013  . 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   Marland Kitchen HLD (hyperlipidemia)   . Hypertension   . Thrombocytopenia ([place]) 01/2015   in setting of sepsis, cholangitis.   . Tubular adenoma of colon 09/2012   on colonoscopy diverticulosis and hemorrhoids noted as well    Past Surgical History:  Procedure Laterality Date  . ERCP N/A 02/03/2015   Procedure: ENDOSCOPIC RETROGRADE CHOLANGIOPANCREATOGRAPHY (ERCP);  Surgeon: Carol Ada, MD;  Location[facility];  Service: Endoscopy;   Laterality: N/A;  . ERCP N/A 05/14/2017   Procedure: ENDOSCOPIC RETROGRADE CHOLANGIOPANCREATOGRAPHY (ERCP);  Surgeon: Ladene Artist, MD;  Location: [facility] ENDOSCOPY;  Service: Endoscopy;  Laterality: N/A;  . ERCP W/ SPHICTEROTOMY  06/2004    Ampulla in a diverticulum with multiple stones, found in the  common bile duct  . fracture arm     age 77  . LAPAROSCOPIC CHOLECYSTECTOMY  06/2004   dr Zella Richer  . WISDOM TOOTH EXTRACTION      Family History  Problem Relation Age of Onset  . Multiple sclerosis Sister   . Emphysema Father   . Heart attack Father   . Heart disease Mother   . Colon cancer Neg Hx     Social History   Tobacco Use  . Smoking status: Former Smoker    Types: Cigarettes    Quit date: 09/21/1963    Years since quitting: 5.1  . Smokeless tobacco: Never Used  Vaping Use  . Vaping Use: Never used  Substance Use Topics  . Alcohol use: Not Currently  . Drug use: No     Home Medications Prior to Admission medications   Medication Sig Start Date End Date Taking? Authorizing Provider  atorvastatin (LIPITOR) 40 MG tablet Take 1 tablet (40 mg total) by mouth daily at 6 PM. Restart after 1 week, when Liver function tests normalize 02/06/15   Domenic Polite, MD  cefdinir (OMNICEF) 300 MG capsule Take 1 capsule (300 mg total)  by mouth 2 (two) times daily. 05/17/17   Roxan Hockey, MD  hydrOXYzine (ATARAX/VISTARIL) 25 MG tablet Take 1 tablet (25 mg total) every 6 (six) hours as needed by mouth for itching. 05/08/17   Long, Wonda Olds, MD  LORazepam (ATIVAN) 1 MG tablet Take 1 tablet (1 mg total) every 6 (six) hours as needed by mouth for sleep. 05/08/17   Long, Wonda Olds, MD  losartan (COZAAR) 50 MG tablet Take 50 mg by mouth daily.    [provider]  metFORMIN (GLUCOPHAGE-XR) 500 MG 24 hr tablet Take 1 tablet (500 mg total) by mouth every evening. Restart om 05/20/17 05/17/17   Roxan Hockey, MD  Multiple Vitamin (MULTIVITAMIN WITH MINERALS) TABS tablet Take 1  tablet daily by mouth.    [provider]  omeprazole (PRILOSEC) 20 MG capsule Take 1 capsule (20 mg total) by mouth daily. 05/17/17   Roxan Hockey, MD  triamterene-hydrochlorothiazide (MAXZIDE) 75-50 MG tablet Take 0.5 tablets by mouth every morning. 05/17/17   Roxan Hockey, MD     Allergies    Allopurinol   Review of Systems   Review of Systems A comprehensive review of systems was completed and negative except as noted in HPI.    Physical Exam BP (!) 118/93   Pulse 79   Temp 97.9 F (36.6 C) (Oral)   Resp 14   Ht 5\' 10"  (1.778 m)   Wt 77.1 kg   SpO2 100%   BMI 24.39 kg/m   Physical Exam Vitals and nursing note reviewed.  Constitutional:      Appearance: Normal appearance.  HENT:     Head: Normocephalic and atraumatic.     Nose: Nose normal.     Mouth/Throat:     Mouth: Mucous membranes are moist.  Eyes:     Extraocular Movements: Extraocular movements intact.     Conjunctiva/sclera: Conjunctivae normal.  Cardiovascular:     Rate and Rhythm: Normal rate.  Pulmonary:     Effort: Pulmonary effort is normal.     Breath sounds: Normal breath sounds.  Abdominal:     General: Abdomen is flat.     Palpations: Abdomen is soft.     Tenderness: There is no abdominal tenderness.  Musculoskeletal:        General: No swelling. Normal range of motion.     Cervical back: Neck supple.  Skin:    General: Skin is warm and dry.  Neurological:     General: No focal deficit present.     Mental Status: He is alert.  Psychiatric:        Mood and Affect: Mood normal.      ED Results / Procedures / Treatments   Labs (all labs ordered are listed, but only abnormal results are displayed) Labs Reviewed  CBC WITH DIFFERENTIAL/PLATELET - Abnormal; Notable for the following components:      Result Value   RBC 3.77 (*)    Hemoglobin 11.9 (*)    HCT 32.9 (*)    MCHC 36.2 (*)    All other components within normal limits  COMPREHENSIVE METABOLIC PANEL -  Abnormal; Notable for the following components:   Sodium 129 (*)    Chloride 94 (*)    Glucose, Bld 157 (*)    Creatinine, Ser 1.43 (*)    Total Bilirubin 1.6 (*)    GFR, Estimated 49 (*)    All other components within normal limits  LIPASE, BLOOD - Abnormal; Notable for the following components:   Lipase  80 (*)    All other components within normal limits  URINALYSIS, ROUTINE W REFLEX MICROSCOPIC - Abnormal; Notable for the following components:   Color, Urine STRAW (*)    Specific Gravity, Urine 1.003 (*)    Glucose, UA >=500 (*)    Protein, ur 30 (*)    All other components within normal limits  CBG MONITORING, ED - Abnormal; Notable for the following components:   Glucose-Capillary 159 (*)    All other components within normal limits    EKG None  Radiology No results found.  Procedures Procedures  Medications Ordered in the ED Medications - No data to display   MDM Rules/Calculators/A&P MDM Patient here with reported hyponatremia from outpatient labs. Suspect this is due to free water intake. He is feeling well otherwise. Will check labs here to confirm.  ED Course  I have reviewed the triage vital signs and the nursing notes.  Pertinent labs & imaging results that were available during my care of the patient were reviewed by me and considered in my medical decision making (see chart for details).  Clinical Course as of 10/26/20 1700  Fri Oct 26, 2020  1523 UA with glucosuria, but no signs of infection. Low specific gravity. CBG is mildly elevated, not fasting.  [CS]  1549 CBC with mild anemia, unchanged from baseline.  [CS]  1645 CMP shows mild hyponatremia, not too far from his prior baseline and not in need of emergent management. Suspect this is dilution from his significant water intake recently. He does not have any symptoms of significant hyponatremia. He has CKD, Creatinie is not significantly changed from prior values.  [CS]  1829 Lipase is mildly  elevated, he is not having any abdominal pain or symptoms of pancreatitis.  [CS]    Clinical Course User Index [CS] Truddie Hidden, MD    Final Clinical Impression(s) / ED Diagnoses Final diagnoses:  Hyponatremia    Rx / DC Orders ED Discharge Orders    None       Truddie Hidden, MD 10/26/20 1700

## 2020-10-26 NOTE — ED Provider Notes (Addendum)
Emergency Medicine Provider Triage Evaluation Note  Jesse Hoffman , a 85 y.o. male  was evaluated in triage.  Pt complains of fatigue.  Review of Systems  Positive: Fatigue, polyuria, polydipsia Negative: Fever, headache, cp, confusion, seizure, n/v/d  Physical Exam  BP (!) 153/79 (BP Location: Left Arm)   Pulse 77   Temp 97.9 F (36.6 C) (Oral)   Resp 16   Ht 5\' 10"  (1.778 m)   Wt 77.1 kg   SpO2 100%   BMI 24.39 kg/m  Gen:   Awake, no distress   Resp:  Normal effort  MSK:   Moves extremities without difficulty  Other:  Normal mentation, equal strength throughout  Medical Decision Making  Medically screening exam initiated at 2:12 PM.  Appropriate orders placed.  Jesse Hoffman was informed that the remainder of the evaluation will be completed by another provider, this initial triage assessment does not replace that evaluation, and the importance of remaining in the ED until their evaluation is complete.  Hx of DM, endorse fatigue for several weeks, seen by PCP yesterday and was told that his sodium level is low.  Denies n/v/d but does endorse polyuria/polydipsia.    Domenic Moras, PA-C 10/26/20 1414    Sherwood Gambler, MD 10/26/20 [phone]

## 2020-10-26 NOTE — ED Notes (Signed)
Family at bedside. 

## 2020-10-29 DIAGNOSIS — R059 Cough, unspecified: Secondary | ICD-10-CM | POA: Diagnosis not present

## 2020-10-29 DIAGNOSIS — E871 Hypo-osmolality and hyponatremia: Secondary | ICD-10-CM | POA: Diagnosis not present

## 2020-10-31 DIAGNOSIS — N1831 Chronic kidney disease, stage 3a: Secondary | ICD-10-CM | POA: Diagnosis not present

## 2020-10-31 DIAGNOSIS — E1165 Type 2 diabetes mellitus with hyperglycemia: Secondary | ICD-10-CM | POA: Diagnosis not present

## 2020-10-31 DIAGNOSIS — E871 Hypo-osmolality and hyponatremia: Secondary | ICD-10-CM | POA: Diagnosis not present

## 2020-10-31 DIAGNOSIS — E782 Mixed hyperlipidemia: Secondary | ICD-10-CM | POA: Diagnosis not present

## 2020-11-07 DIAGNOSIS — I10 Essential (primary) hypertension: Secondary | ICD-10-CM | POA: Diagnosis not present

## 2020-11-07 DIAGNOSIS — E1165 Type 2 diabetes mellitus with hyperglycemia: Secondary | ICD-10-CM | POA: Diagnosis not present

## 2020-11-07 DIAGNOSIS — E871 Hypo-osmolality and hyponatremia: Secondary | ICD-10-CM | POA: Diagnosis not present

## 2020-11-07 DIAGNOSIS — N1831 Chronic kidney disease, stage 3a: Secondary | ICD-10-CM | POA: Diagnosis not present

## 2020-11-07 DIAGNOSIS — E782 Mixed hyperlipidemia: Secondary | ICD-10-CM | POA: Diagnosis not present

## 2020-12-11 DIAGNOSIS — E871 Hypo-osmolality and hyponatremia: Secondary | ICD-10-CM | POA: Diagnosis not present

## 2020-12-13 DIAGNOSIS — M5412 Radiculopathy, cervical region: Secondary | ICD-10-CM | POA: Diagnosis not present

## 2021-01-02 DIAGNOSIS — E871 Hypo-osmolality and hyponatremia: Secondary | ICD-10-CM | POA: Diagnosis not present

## 2021-01-02 DIAGNOSIS — E782 Mixed hyperlipidemia: Secondary | ICD-10-CM | POA: Diagnosis not present

## 2021-01-02 DIAGNOSIS — N1831 Chronic kidney disease, stage 3a: Secondary | ICD-10-CM | POA: Diagnosis not present

## 2021-01-02 DIAGNOSIS — E1165 Type 2 diabetes mellitus with hyperglycemia: Secondary | ICD-10-CM | POA: Diagnosis not present

## 2021-01-03 DIAGNOSIS — M1A0790 Idiopathic chronic gout, unspecified ankle and foot, without tophus (tophi): Secondary | ICD-10-CM | POA: Diagnosis not present

## 2021-01-03 DIAGNOSIS — E1165 Type 2 diabetes mellitus with hyperglycemia: Secondary | ICD-10-CM | POA: Diagnosis not present

## 2021-01-07 DIAGNOSIS — E782 Mixed hyperlipidemia: Secondary | ICD-10-CM | POA: Diagnosis not present

## 2021-01-07 DIAGNOSIS — E1165 Type 2 diabetes mellitus with hyperglycemia: Secondary | ICD-10-CM | POA: Diagnosis not present

## 2021-01-07 DIAGNOSIS — I10 Essential (primary) hypertension: Secondary | ICD-10-CM | POA: Diagnosis not present

## 2021-01-10 DIAGNOSIS — M5412 Radiculopathy, cervical region: Secondary | ICD-10-CM | POA: Diagnosis not present

## 2021-01-10 DIAGNOSIS — R200 Anesthesia of skin: Secondary | ICD-10-CM | POA: Diagnosis not present

## 2021-01-10 DIAGNOSIS — M542 Cervicalgia: Secondary | ICD-10-CM | POA: Diagnosis not present

## 2021-01-20 DIAGNOSIS — E782 Mixed hyperlipidemia: Secondary | ICD-10-CM | POA: Diagnosis not present

## 2021-01-20 DIAGNOSIS — E1122 Type 2 diabetes mellitus with diabetic chronic kidney disease: Secondary | ICD-10-CM | POA: Diagnosis not present

## 2021-01-20 DIAGNOSIS — N1831 Chronic kidney disease, stage 3a: Secondary | ICD-10-CM | POA: Diagnosis not present

## 2021-01-20 DIAGNOSIS — I129 Hypertensive chronic kidney disease with stage 1 through stage 4 chronic kidney disease, or unspecified chronic kidney disease: Secondary | ICD-10-CM | POA: Diagnosis not present

## 2021-02-05 DIAGNOSIS — L821 Other seborrheic keratosis: Secondary | ICD-10-CM | POA: Diagnosis not present

## 2021-02-05 DIAGNOSIS — L570 Actinic keratosis: Secondary | ICD-10-CM | POA: Diagnosis not present

## 2021-02-05 DIAGNOSIS — D1801 Hemangioma of skin and subcutaneous tissue: Secondary | ICD-10-CM | POA: Diagnosis not present

## 2021-02-05 DIAGNOSIS — B351 Tinea unguium: Secondary | ICD-10-CM | POA: Diagnosis not present

## 2021-02-05 DIAGNOSIS — L853 Xerosis cutis: Secondary | ICD-10-CM | POA: Diagnosis not present

## 2021-02-11 DIAGNOSIS — I10 Essential (primary) hypertension: Secondary | ICD-10-CM | POA: Diagnosis not present

## 2021-02-11 DIAGNOSIS — I499 Cardiac arrhythmia, unspecified: Secondary | ICD-10-CM | POA: Diagnosis not present

## 2021-02-20 DIAGNOSIS — N1831 Chronic kidney disease, stage 3a: Secondary | ICD-10-CM | POA: Diagnosis not present

## 2021-02-20 DIAGNOSIS — E782 Mixed hyperlipidemia: Secondary | ICD-10-CM | POA: Diagnosis not present

## 2021-02-20 DIAGNOSIS — E1122 Type 2 diabetes mellitus with diabetic chronic kidney disease: Secondary | ICD-10-CM | POA: Diagnosis not present

## 2021-02-20 DIAGNOSIS — I129 Hypertensive chronic kidney disease with stage 1 through stage 4 chronic kidney disease, or unspecified chronic kidney disease: Secondary | ICD-10-CM | POA: Diagnosis not present

## 2021-03-15 DIAGNOSIS — I499 Cardiac arrhythmia, unspecified: Secondary | ICD-10-CM | POA: Diagnosis not present

## 2021-03-18 DIAGNOSIS — E1165 Type 2 diabetes mellitus with hyperglycemia: Secondary | ICD-10-CM | POA: Diagnosis not present

## 2021-03-18 DIAGNOSIS — E782 Mixed hyperlipidemia: Secondary | ICD-10-CM | POA: Diagnosis not present

## 2021-03-18 DIAGNOSIS — I129 Hypertensive chronic kidney disease with stage 1 through stage 4 chronic kidney disease, or unspecified chronic kidney disease: Secondary | ICD-10-CM | POA: Diagnosis not present

## 2021-04-15 DIAGNOSIS — M1A0790 Idiopathic chronic gout, unspecified ankle and foot, without tophus (tophi): Secondary | ICD-10-CM | POA: Diagnosis not present

## 2021-04-15 DIAGNOSIS — Z23 Encounter for immunization: Secondary | ICD-10-CM | POA: Diagnosis not present

## 2021-04-15 DIAGNOSIS — E1165 Type 2 diabetes mellitus with hyperglycemia: Secondary | ICD-10-CM | POA: Diagnosis not present

## 2021-04-15 DIAGNOSIS — I10 Essential (primary) hypertension: Secondary | ICD-10-CM | POA: Diagnosis not present

## 2021-04-15 DIAGNOSIS — E782 Mixed hyperlipidemia: Secondary | ICD-10-CM | POA: Diagnosis not present

## 2021-04-22 DIAGNOSIS — E782 Mixed hyperlipidemia: Secondary | ICD-10-CM | POA: Diagnosis not present

## 2021-04-22 DIAGNOSIS — E1165 Type 2 diabetes mellitus with hyperglycemia: Secondary | ICD-10-CM | POA: Diagnosis not present

## 2021-04-22 DIAGNOSIS — N1831 Chronic kidney disease, stage 3a: Secondary | ICD-10-CM | POA: Diagnosis not present

## 2021-04-22 DIAGNOSIS — I129 Hypertensive chronic kidney disease with stage 1 through stage 4 chronic kidney disease, or unspecified chronic kidney disease: Secondary | ICD-10-CM | POA: Diagnosis not present

## 2021-08-20 DIAGNOSIS — H52203 Unspecified astigmatism, bilateral: Secondary | ICD-10-CM | POA: Diagnosis not present

## 2021-08-20 DIAGNOSIS — E782 Mixed hyperlipidemia: Secondary | ICD-10-CM | POA: Diagnosis not present

## 2021-08-20 DIAGNOSIS — I129 Hypertensive chronic kidney disease with stage 1 through stage 4 chronic kidney disease, or unspecified chronic kidney disease: Secondary | ICD-10-CM | POA: Diagnosis not present

## 2021-08-20 DIAGNOSIS — E1122 Type 2 diabetes mellitus with diabetic chronic kidney disease: Secondary | ICD-10-CM | POA: Diagnosis not present

## 2021-08-20 DIAGNOSIS — N1831 Chronic kidney disease, stage 3a: Secondary | ICD-10-CM | POA: Diagnosis not present

## 2021-08-20 DIAGNOSIS — H26493 Other secondary cataract, bilateral: Secondary | ICD-10-CM | POA: Diagnosis not present

## 2021-08-20 DIAGNOSIS — E113292 Type 2 diabetes mellitus with mild nonproliferative diabetic retinopathy without macular edema, left eye: Secondary | ICD-10-CM | POA: Diagnosis not present

## 2021-09-02 DIAGNOSIS — E782 Mixed hyperlipidemia: Secondary | ICD-10-CM | POA: Diagnosis not present

## 2021-09-02 DIAGNOSIS — I129 Hypertensive chronic kidney disease with stage 1 through stage 4 chronic kidney disease, or unspecified chronic kidney disease: Secondary | ICD-10-CM | POA: Diagnosis not present

## 2021-09-02 DIAGNOSIS — N1831 Chronic kidney disease, stage 3a: Secondary | ICD-10-CM | POA: Diagnosis not present

## 2021-09-02 DIAGNOSIS — E1165 Type 2 diabetes mellitus with hyperglycemia: Secondary | ICD-10-CM | POA: Diagnosis not present

## 2021-09-03 DIAGNOSIS — H26492 Other secondary cataract, left eye: Secondary | ICD-10-CM | POA: Diagnosis not present

## 2021-09-09 DIAGNOSIS — E782 Mixed hyperlipidemia: Secondary | ICD-10-CM | POA: Diagnosis not present

## 2021-09-09 DIAGNOSIS — E113293 Type 2 diabetes mellitus with mild nonproliferative diabetic retinopathy without macular edema, bilateral: Secondary | ICD-10-CM | POA: Diagnosis not present

## 2021-09-09 DIAGNOSIS — N1831 Chronic kidney disease, stage 3a: Secondary | ICD-10-CM | POA: Diagnosis not present

## 2021-09-09 DIAGNOSIS — I10 Essential (primary) hypertension: Secondary | ICD-10-CM | POA: Diagnosis not present

## 2021-09-09 DIAGNOSIS — Z23 Encounter for immunization: Secondary | ICD-10-CM | POA: Diagnosis not present

## 2021-09-09 DIAGNOSIS — I700 Atherosclerosis of aorta: Secondary | ICD-10-CM | POA: Diagnosis not present

## 2021-09-20 DIAGNOSIS — I129 Hypertensive chronic kidney disease with stage 1 through stage 4 chronic kidney disease, or unspecified chronic kidney disease: Secondary | ICD-10-CM | POA: Diagnosis not present

## 2021-09-20 DIAGNOSIS — E782 Mixed hyperlipidemia: Secondary | ICD-10-CM | POA: Diagnosis not present

## 2021-09-20 DIAGNOSIS — N1831 Chronic kidney disease, stage 3a: Secondary | ICD-10-CM | POA: Diagnosis not present

## 2021-09-20 DIAGNOSIS — E1122 Type 2 diabetes mellitus with diabetic chronic kidney disease: Secondary | ICD-10-CM | POA: Diagnosis not present

## 2021-10-08 DIAGNOSIS — H26491 Other secondary cataract, right eye: Secondary | ICD-10-CM | POA: Diagnosis not present

## 2021-10-24 DIAGNOSIS — M7521 Bicipital tendinitis, right shoulder: Secondary | ICD-10-CM | POA: Diagnosis not present

## 2021-10-24 DIAGNOSIS — M25511 Pain in right shoulder: Secondary | ICD-10-CM | POA: Diagnosis not present

## 2021-11-14 DIAGNOSIS — M75111 Incomplete rotator cuff tear or rupture of right shoulder, not specified as traumatic: Secondary | ICD-10-CM | POA: Diagnosis not present

## 2021-12-09 DIAGNOSIS — I10 Essential (primary) hypertension: Secondary | ICD-10-CM | POA: Diagnosis not present

## 2021-12-09 DIAGNOSIS — E782 Mixed hyperlipidemia: Secondary | ICD-10-CM | POA: Diagnosis not present

## 2021-12-09 DIAGNOSIS — N1831 Chronic kidney disease, stage 3a: Secondary | ICD-10-CM | POA: Diagnosis not present

## 2021-12-09 DIAGNOSIS — E113293 Type 2 diabetes mellitus with mild nonproliferative diabetic retinopathy without macular edema, bilateral: Secondary | ICD-10-CM | POA: Diagnosis not present

## 2021-12-09 DIAGNOSIS — I700 Atherosclerosis of aorta: Secondary | ICD-10-CM | POA: Diagnosis not present

## 2021-12-16 DIAGNOSIS — M1A0790 Idiopathic chronic gout, unspecified ankle and foot, without tophus (tophi): Secondary | ICD-10-CM | POA: Diagnosis not present

## 2021-12-16 DIAGNOSIS — E782 Mixed hyperlipidemia: Secondary | ICD-10-CM | POA: Diagnosis not present

## 2021-12-16 DIAGNOSIS — N1831 Chronic kidney disease, stage 3a: Secondary | ICD-10-CM | POA: Diagnosis not present

## 2021-12-16 DIAGNOSIS — E113293 Type 2 diabetes mellitus with mild nonproliferative diabetic retinopathy without macular edema, bilateral: Secondary | ICD-10-CM | POA: Diagnosis not present

## 2021-12-16 DIAGNOSIS — I10 Essential (primary) hypertension: Secondary | ICD-10-CM | POA: Diagnosis not present

## 2021-12-16 DIAGNOSIS — R5383 Other fatigue: Secondary | ICD-10-CM | POA: Diagnosis not present

## 2021-12-16 DIAGNOSIS — I700 Atherosclerosis of aorta: Secondary | ICD-10-CM | POA: Diagnosis not present

## 2022-03-17 DIAGNOSIS — I10 Essential (primary) hypertension: Secondary | ICD-10-CM | POA: Diagnosis not present

## 2022-03-17 DIAGNOSIS — I700 Atherosclerosis of aorta: Secondary | ICD-10-CM | POA: Diagnosis not present

## 2022-03-17 DIAGNOSIS — E782 Mixed hyperlipidemia: Secondary | ICD-10-CM | POA: Diagnosis not present

## 2022-03-17 DIAGNOSIS — E113293 Type 2 diabetes mellitus with mild nonproliferative diabetic retinopathy without macular edema, bilateral: Secondary | ICD-10-CM | POA: Diagnosis not present

## 2022-03-17 DIAGNOSIS — N1831 Chronic kidney disease, stage 3a: Secondary | ICD-10-CM | POA: Diagnosis not present

## 2022-03-24 DIAGNOSIS — E782 Mixed hyperlipidemia: Secondary | ICD-10-CM | POA: Diagnosis not present

## 2022-03-24 DIAGNOSIS — I10 Essential (primary) hypertension: Secondary | ICD-10-CM | POA: Diagnosis not present

## 2022-03-24 DIAGNOSIS — M1A0790 Idiopathic chronic gout, unspecified ankle and foot, without tophus (tophi): Secondary | ICD-10-CM | POA: Diagnosis not present

## 2022-03-24 DIAGNOSIS — E113293 Type 2 diabetes mellitus with mild nonproliferative diabetic retinopathy without macular edema, bilateral: Secondary | ICD-10-CM | POA: Diagnosis not present

## 2022-03-24 DIAGNOSIS — Z0000 Encounter for general adult medical examination without abnormal findings: Secondary | ICD-10-CM | POA: Diagnosis not present

## 2022-03-24 DIAGNOSIS — Z23 Encounter for immunization: Secondary | ICD-10-CM | POA: Diagnosis not present

## 2022-03-24 DIAGNOSIS — N1831 Chronic kidney disease, stage 3a: Secondary | ICD-10-CM | POA: Diagnosis not present

## 2022-03-24 DIAGNOSIS — R011 Cardiac murmur, unspecified: Secondary | ICD-10-CM | POA: Diagnosis not present

## 2022-03-24 DIAGNOSIS — I700 Atherosclerosis of aorta: Secondary | ICD-10-CM | POA: Diagnosis not present

## 2022-03-24 DIAGNOSIS — E1165 Type 2 diabetes mellitus with hyperglycemia: Secondary | ICD-10-CM | POA: Diagnosis not present

## 2022-05-01 ENCOUNTER — Ambulatory Visit: Payer: Medicare Other | Attending: Cardiovascular Disease | Admitting: Cardiovascular Disease

## 2022-05-01 ENCOUNTER — Encounter: Payer: Self-pay | Admitting: Cardiovascular Disease

## 2022-05-01 VITALS — BP 126/68 | HR 70 | Ht 71.0 in | Wt 165.6 lb

## 2022-05-01 DIAGNOSIS — E113213 Type 2 diabetes mellitus with mild nonproliferative diabetic retinopathy with macular edema, bilateral: Secondary | ICD-10-CM

## 2022-05-01 DIAGNOSIS — E1122 Type 2 diabetes mellitus with diabetic chronic kidney disease: Secondary | ICD-10-CM

## 2022-05-01 DIAGNOSIS — E7800 Pure hypercholesterolemia, unspecified: Secondary | ICD-10-CM | POA: Diagnosis not present

## 2022-05-01 DIAGNOSIS — Z8739 Personal history of other diseases of the musculoskeletal system and connective tissue: Secondary | ICD-10-CM

## 2022-05-01 DIAGNOSIS — I10 Essential (primary) hypertension: Secondary | ICD-10-CM | POA: Diagnosis not present

## 2022-05-01 DIAGNOSIS — N183 Chronic kidney disease, stage 3 unspecified: Secondary | ICD-10-CM | POA: Diagnosis not present

## 2022-05-01 DIAGNOSIS — R011 Cardiac murmur, unspecified: Secondary | ICD-10-CM

## 2022-05-01 NOTE — Patient Instructions (Signed)
Medication Instructions:  The current medical regimen is effective;  continue present plan and medications.  *If you need a refill on your cardiac medications before your next appointment, please call your pharmacy*  Testing/Procedures: Echocardiogram - Your physician has requested that you have an echocardiogram. Echocardiography is a painless test that uses sound waves to create images of your heart. It provides your doctor with information about the size and shape of your heart and how well your heart's chambers and valves are working. This procedure takes approximately one hour. There are no restrictions for this procedure.     Follow-Up: At [facility], you and your health needs are our priority.  As part of our continuing mission to provide you with exceptional heart care, we have created designated Provider Care Teams.  These Care Teams include your primary Cardiologist (physician) and Advanced Practice Providers (APPs -  Physician Assistants and Nurse Practitioners) who all work together to provide you with the care you need, when you need it.  We recommend signing up for the patient portal called "MyChart".  Sign up information is provided on this After Visit Summary.  MyChart is used to connect with patients for Virtual Visits (Telemedicine).  Patients are able to view lab/test results, encounter notes, upcoming appointments, etc.  Non-urgent messages can be sent to your provider as well.   To learn more about what you can do with MyChart, go to [email or web address].    Your next appointment:   As needed  The format for your next appointment:   In Person  Provider:   Dr.Croitoru

## 2022-05-01 NOTE — Progress Notes (Signed)
Cardiology Office Note:    Date:  05/04/2022   ID:  Jesse Hoffman, DOB May 04, 1936, MRN [id number]  PCP:  Merrilee Seashore, MD   [facility] Providers Cardiologist:  None     Referring MD: Merrilee Seashore, MD   No chief complaint on file. Jesse Hoffman is a 86 y.o. male who is being seen today for the evaluation of cardiac murmur at the request of Merrilee Seashore, MD.   History of Present Illness:    Jesse Hoffman is a 86 y.o. male with a hx of recurrent biliary problems since an episode of common bile duct stone in 2016, diabetes mellitus type 2, CKD stage III, mild diabetic retinopathy, hypertension, hypercholesterolemia, history of gout, referred in consultation for a systolic murmur.  He is very active and appears fit.  He takes care of his own yard including using a push mower.  He exercises at the gym regularly.  He is not limited by shortness of breath or chest pain.  He has not had problems with palpitations, dizziness, syncope, leg edema, orthopnea, PND, claudication or focal neurological complaints.  Dr. Ashby Dawes told him that his murmur sounds more prominent this year.  He has never had an echocardiogram.  He has excellent glycemic control with a hemoglobin A1c of 5.7%.  Likewise his lipid profile is excellent with an LDL of 62 on treatment with atorvastatin.  He has normal HDL and triglyceride levels.  Creatinine was most recently 1.43.  Past Medical History:  Diagnosis Date   Ascending cholangitis 2006, 01/2015   2016: in setting of choledocholithiasis.  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    HLD (hyperlipidemia)    Hypertension     Thrombocytopenia ([place]) 01/2015   in setting of sepsis, cholangitis.    Tubular adenoma of colon 09/2012   on colonoscopy diverticulosis and hemorrhoids noted as well    Past Surgical History:  Procedure Laterality Date   ERCP N/A 02/03/2015   Procedure: ENDOSCOPIC RETROGRADE CHOLANGIOPANCREATOGRAPHY (ERCP);  Surgeon: Carol Ada, MD;  [facility] ENDOSCOPY;  Service: Endoscopy;  Laterality: N/A;   ERCP N/A 05/14/2017   Procedure: ENDOSCOPIC RETROGRADE CHOLANGIOPANCREATOGRAPHY (ERCP);  Surgeon: Ladene Artist, MD;  Location: [facility] ENDOSCOPY;  Service: Endoscopy;  Laterality: N/A;   ERCP W/ SPHICTEROTOMY  06/2004    Ampulla in a diverticulum with multiple stones, found in the  common bile duct   fracture arm     age 65   LAPAROSCOPIC CHOLECYSTECTOMY  06/2004   dr Zella Richer   WISDOM TOOTH EXTRACTION      Current Medications: Current Meds  Medication Sig   Alpha Lipoic Acid 200 MG CAPS Take 200 mg by mouth daily in the afternoon.   amLODipine (NORVASC) 5 MG tablet Take 1 tablet by mouth daily.   atorvastatin (LIPITOR) 40 MG tablet Take 1 tablet (40 mg total) by mouth daily at 6 PM. Restart after 1 week, when Liver function tests normalize   B Complex CAPS Take 1 capsule by mouth every other day.   Colchicine 0.6 MG CAPS Take 1 tablet by mouth daily.   FARXIGA 10 MG TABS tablet Take 10 mg by mouth daily.  Lancets (ONETOUCH DELICA PLUS ENIDPO24M) MISC Apply topically 2 (two) times daily.   losartan (COZAAR) 50 MG tablet Take 1 tablet by mouth 2 (two) times daily.   metFORMIN (GLUCOPHAGE-XR) 500 MG 24 hr tablet Take 1 tablet (500 mg total) by mouth every evening. Restart om 05/20/17   Multiple Vitamin (MULTIVITAMIN WITH MINERALS) TABS tablet Take 1 tablet daily by mouth.   omeprazole (PRILOSEC) 20 MG capsule Take 1 capsule (20 mg total) by mouth daily.   ONETOUCH VERIO test strip in the morning and at bedtime.   repaglinide (PRANDIN) 1 MG tablet Take 1 mg by mouth daily in the afternoon.      Allergies:   Allopurinol   Social History   Socioeconomic History   Marital status: Married    Spouse name: Not on file   Number of children: 2   Years of education: Not on file   Highest education level: Not on file  Occupational History   Occupation: retired  Tobacco Use   Smoking status: Former    Types: Cigarettes    Quit date: 09/21/1963    Years since quitting: 58.6   Smokeless tobacco: Never  Vaping Use   Vaping Use: Never used  Substance and Sexual Activity   Alcohol use: Not Currently   Drug use: No   Sexual activity: Not on file  Other Topics Concern   Not on file  Social History Narrative   Not on file   Social Determinants of Health   Financial Resource Strain: Not on file  Food Insecurity: Not on file  Transportation Needs: Not on file  Physical Activity: Not on file  Stress: Not on file  Social Connections: Not on file     Family History: The patient's family history includes Emphysema in his father; Heart attack in his father; Heart disease in his mother; Multiple sclerosis in his sister. There is no history of Colon cancer.  ROS:   Please see the history of present illness.     All other systems reviewed and are negative.  EKGs/Labs/Other Studies Reviewed:    The following studies were reviewed today: Notes from PCP  EKG:  EKG is ordered today.  The ekg ordered today demonstrates sinus rhythm with a couple of PACs, RBBB (only 46 ms), otherwise normal tracing.  QTc 468 ms  Recent Labs: No results found for requested labs within last 365 days.  Recent Lipid Panel    Component Value Date/Time   CHOL 119 02/02/2015 0444   TRIG 187 (H) 02/02/2015 0444   HDL 25 (L) 02/02/2015 0444   CHOLHDL 4.8 02/02/2015 0444   VLDL 37 02/02/2015 0444   LDLCALC 57 02/02/2015 0444     Risk Assessment/Calculations:            Physical Exam:    VS:  BP 126/68 (BP Location: Left Arm, Patient Position: Sitting, Cuff Size: Normal)   Pulse 70   Ht 5'  11" (1.803 m)   Wt 75.1 kg   SpO2 96%   BMI 23.10 kg/m     Wt Readings from Last 3 Encounters:  05/01/22 75.1 kg  10/26/20 77.1 kg  05/29/17 76.2 kg     GEN: Appears lean and fit, younger than stated age well nourished, well developed in no acute distress HEENT: Normal NECK: No JVD; No carotid bruits LYMPHATICS: No lymphadenopathy CARDIAC: RRR, normal S1, widely split S2, 3/6 systolic murmurs heard best at the right upper sternal border but radiates all the way to the  apex where it has a holosystolic quality, no diastolic murmurs, rubs, gallops RESPIRATORY:  Clear to auscultation without rales, wheezing or rhonchi  ABDOMEN: Soft, non-tender, non-distended MUSCULOSKELETAL:  No edema; No deformity  SKIN: Warm and dry NEUROLOGIC:  Alert and oriented x 3 PSYCHIATRIC:  Normal affect   ASSESSMENT:    1. Murmur   2. Hypercholesterolemia   3. 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 Essential hypertension   5. CKD stage 3 due to type 2 diabetes mellitus ([place])   6. History of gout    PLAN:    In order of problems listed above:  Cardiac murmur: Most likely represents aortic valve sclerosis or mild aortic stenosis, but it radiates all the way to the apex.  Recommend echocardiogram.  Asymptomatic. HLP: Excellent lipid parameters on current statin prescription. DM: Excellent control on SGLT2 inhibitor and metformin, plus Prandin. HTN: BP in optimal range for patient with diabetes and CKD, less than 130/80. CKD 3a: On Farxiga and ARB, which should provide protection from progression of disease. History of gout: No recent episodes.  Note that on the medication list one of the agents for his blood pressure is triamterene/hydrochlorothiazide, but the patient reports that he does not take this medication any longer.            Medication Adjustments/Labs and Tests  Ordered: Current medicines are reviewed at length with the patient today.  Concerns regarding medicines are outlined above.  Orders Placed This Encounter  Procedures   EKG 12-Lead   ECHOCARDIOGRAM COMPLETE   No orders of the defined types were placed in this encounter.   Patient Instructions  Medication Instructions:  The current medical regimen is effective;  continue present plan and medications.  *If you need a refill on your cardiac medications before your next appointment, please call your pharmacy*  Testing/Procedures: Echocardiogram - Your physician has requested that you have an echocardiogram. Echocardiography is a painless test that uses sound waves to create images of your heart. It provides your doctor with information about the size and shape of your heart and how well your heart's chambers and valves are working. This procedure takes approximately one hour. There are no restrictions for this procedure.     Follow-Up: At [facility], you and your health needs are our priority.  As part of our continuing mission to provide you with exceptional heart care, we have created designated Provider Care Teams.  These Care Teams include your primary Cardiologist (physician) and Advanced Practice Providers (APPs -  Physician Assistants and Nurse Practitioners) who all work together to provide you with the care you need, when you need it.  We recommend signing up for the patient portal called "MyChart".  Sign up information is provided on this After Visit Summary.  MyChart is used to connect with patients for Virtual Visits (Telemedicine).  Patients are able to view lab/test results, encounter notes, upcoming appointments, etc.  Non-urgent messages can be sent to your provider as well.   To learn more about what you can do with MyChart, go to [email or web address].    Your next appointment:   As needed  The format for your next appointment:   In Person  Provider:    Dr.Jaedynn Bohlken           Signed, Sanda Klein, MD  05/04/2022 8:06 PM    [place]

## 2022-05-20 DIAGNOSIS — Z08 Encounter for follow-up examination after completed treatment for malignant neoplasm: Secondary | ICD-10-CM | POA: Diagnosis not present

## 2022-05-20 DIAGNOSIS — L570 Actinic keratosis: Secondary | ICD-10-CM | POA: Diagnosis not present

## 2022-05-20 DIAGNOSIS — L821 Other seborrheic keratosis: Secondary | ICD-10-CM | POA: Diagnosis not present

## 2022-05-20 DIAGNOSIS — L853 Xerosis cutis: Secondary | ICD-10-CM | POA: Diagnosis not present

## 2022-05-20 DIAGNOSIS — L814 Other melanin hyperpigmentation: Secondary | ICD-10-CM | POA: Diagnosis not present

## 2022-05-20 DIAGNOSIS — Z85828 Personal history of other malignant neoplasm of skin: Secondary | ICD-10-CM | POA: Diagnosis not present

## 2022-05-20 DIAGNOSIS — D225 Melanocytic nevi of trunk: Secondary | ICD-10-CM | POA: Diagnosis not present

## 2022-05-21 ENCOUNTER — Ambulatory Visit (HOSPITAL_COMMUNITY): Payer: Medicare Other | Attending: Cardiovascular Disease

## 2022-05-21 DIAGNOSIS — R011 Cardiac murmur, unspecified: Secondary | ICD-10-CM

## 2022-05-21 LAB — ECHOCARDIOGRAM COMPLETE
AR max vel: 1.98 cm2
AV Area VTI: 2.07 cm2
AV Area mean vel: 1.96 cm2
AV Mean grad: 10.5 mmHg
AV Peak grad: 18.8 mmHg
Ao pk vel: 2.17 m/s
Area-P 1/2: 2.48 cm2
S' Lateral: 3.9 cm

## 2022-05-27 ENCOUNTER — Other Ambulatory Visit: Payer: Self-pay

## 2022-05-27 DIAGNOSIS — R011 Cardiac murmur, unspecified: Secondary | ICD-10-CM

## 2022-05-27 DIAGNOSIS — I350 Nonrheumatic aortic (valve) stenosis: Secondary | ICD-10-CM

## 2022-06-23 DIAGNOSIS — C4490 Unspecified malignant neoplasm of skin, unspecified: Secondary | ICD-10-CM

## 2022-06-23 HISTORY — DX: Unspecified malignant neoplasm of skin, unspecified: C44.90

## 2022-08-11 DIAGNOSIS — E1165 Type 2 diabetes mellitus with hyperglycemia: Secondary | ICD-10-CM | POA: Diagnosis not present

## 2022-08-11 DIAGNOSIS — N1831 Chronic kidney disease, stage 3a: Secondary | ICD-10-CM | POA: Diagnosis not present

## 2022-08-11 DIAGNOSIS — I700 Atherosclerosis of aorta: Secondary | ICD-10-CM | POA: Diagnosis not present

## 2022-08-11 DIAGNOSIS — M1A0790 Idiopathic chronic gout, unspecified ankle and foot, without tophus (tophi): Secondary | ICD-10-CM | POA: Diagnosis not present

## 2022-08-11 DIAGNOSIS — I10 Essential (primary) hypertension: Secondary | ICD-10-CM | POA: Diagnosis not present

## 2022-08-11 DIAGNOSIS — E782 Mixed hyperlipidemia: Secondary | ICD-10-CM | POA: Diagnosis not present

## 2022-08-18 DIAGNOSIS — I700 Atherosclerosis of aorta: Secondary | ICD-10-CM | POA: Diagnosis not present

## 2022-08-18 DIAGNOSIS — N1831 Chronic kidney disease, stage 3a: Secondary | ICD-10-CM | POA: Diagnosis not present

## 2022-08-18 DIAGNOSIS — E782 Mixed hyperlipidemia: Secondary | ICD-10-CM | POA: Diagnosis not present

## 2022-08-18 DIAGNOSIS — M1A0790 Idiopathic chronic gout, unspecified ankle and foot, without tophus (tophi): Secondary | ICD-10-CM | POA: Diagnosis not present

## 2022-08-18 DIAGNOSIS — I10 Essential (primary) hypertension: Secondary | ICD-10-CM | POA: Diagnosis not present

## 2022-08-18 DIAGNOSIS — E1165 Type 2 diabetes mellitus with hyperglycemia: Secondary | ICD-10-CM | POA: Diagnosis not present

## 2022-10-08 DIAGNOSIS — M19011 Primary osteoarthritis, right shoulder: Secondary | ICD-10-CM | POA: Diagnosis not present

## 2022-10-08 DIAGNOSIS — S46001A Unspecified injury of muscle(s) and tendon(s) of the rotator cuff of right shoulder, initial encounter: Secondary | ICD-10-CM | POA: Diagnosis not present

## 2022-10-08 DIAGNOSIS — M25511 Pain in right shoulder: Secondary | ICD-10-CM | POA: Diagnosis not present

## 2022-10-17 DIAGNOSIS — M25511 Pain in right shoulder: Secondary | ICD-10-CM | POA: Diagnosis not present

## 2022-10-28 DIAGNOSIS — H43813 Vitreous degeneration, bilateral: Secondary | ICD-10-CM | POA: Diagnosis not present

## 2022-10-28 DIAGNOSIS — H52203 Unspecified astigmatism, bilateral: Secondary | ICD-10-CM | POA: Diagnosis not present

## 2022-10-28 DIAGNOSIS — Z961 Presence of intraocular lens: Secondary | ICD-10-CM | POA: Diagnosis not present

## 2022-10-28 DIAGNOSIS — E119 Type 2 diabetes mellitus without complications: Secondary | ICD-10-CM | POA: Diagnosis not present

## 2022-10-28 DIAGNOSIS — H524 Presbyopia: Secondary | ICD-10-CM | POA: Diagnosis not present

## 2022-11-03 DIAGNOSIS — M25511 Pain in right shoulder: Secondary | ICD-10-CM | POA: Diagnosis not present

## 2022-11-05 DIAGNOSIS — S46001A Unspecified injury of muscle(s) and tendon(s) of the rotator cuff of right shoulder, initial encounter: Secondary | ICD-10-CM | POA: Diagnosis not present

## 2022-11-05 DIAGNOSIS — M7541 Impingement syndrome of right shoulder: Secondary | ICD-10-CM | POA: Diagnosis not present

## 2022-11-05 DIAGNOSIS — M19011 Primary osteoarthritis, right shoulder: Secondary | ICD-10-CM | POA: Diagnosis not present

## 2022-11-18 DIAGNOSIS — I700 Atherosclerosis of aorta: Secondary | ICD-10-CM | POA: Diagnosis not present

## 2022-11-18 DIAGNOSIS — E1165 Type 2 diabetes mellitus with hyperglycemia: Secondary | ICD-10-CM | POA: Diagnosis not present

## 2022-11-18 DIAGNOSIS — C44529 Squamous cell carcinoma of skin of other part of trunk: Secondary | ICD-10-CM | POA: Diagnosis not present

## 2022-11-18 DIAGNOSIS — N1831 Chronic kidney disease, stage 3a: Secondary | ICD-10-CM | POA: Diagnosis not present

## 2022-11-18 DIAGNOSIS — L814 Other melanin hyperpigmentation: Secondary | ICD-10-CM | POA: Diagnosis not present

## 2022-11-18 DIAGNOSIS — D225 Melanocytic nevi of trunk: Secondary | ICD-10-CM | POA: Diagnosis not present

## 2022-11-18 DIAGNOSIS — Z85828 Personal history of other malignant neoplasm of skin: Secondary | ICD-10-CM | POA: Diagnosis not present

## 2022-11-18 DIAGNOSIS — Z08 Encounter for follow-up examination after completed treatment for malignant neoplasm: Secondary | ICD-10-CM | POA: Diagnosis not present

## 2022-11-18 DIAGNOSIS — E782 Mixed hyperlipidemia: Secondary | ICD-10-CM | POA: Diagnosis not present

## 2022-11-18 DIAGNOSIS — L821 Other seborrheic keratosis: Secondary | ICD-10-CM | POA: Diagnosis not present

## 2022-11-18 DIAGNOSIS — I10 Essential (primary) hypertension: Secondary | ICD-10-CM | POA: Diagnosis not present

## 2022-11-24 DIAGNOSIS — E1165 Type 2 diabetes mellitus with hyperglycemia: Secondary | ICD-10-CM | POA: Diagnosis not present

## 2022-11-24 DIAGNOSIS — I10 Essential (primary) hypertension: Secondary | ICD-10-CM | POA: Diagnosis not present

## 2022-11-24 DIAGNOSIS — E1121 Type 2 diabetes mellitus with diabetic nephropathy: Secondary | ICD-10-CM | POA: Diagnosis not present

## 2022-11-24 DIAGNOSIS — I700 Atherosclerosis of aorta: Secondary | ICD-10-CM | POA: Diagnosis not present

## 2022-11-24 DIAGNOSIS — M1A0790 Idiopathic chronic gout, unspecified ankle and foot, without tophus (tophi): Secondary | ICD-10-CM | POA: Diagnosis not present

## 2022-11-24 DIAGNOSIS — R011 Cardiac murmur, unspecified: Secondary | ICD-10-CM | POA: Diagnosis not present

## 2022-11-24 DIAGNOSIS — E782 Mixed hyperlipidemia: Secondary | ICD-10-CM | POA: Diagnosis not present

## 2022-11-24 DIAGNOSIS — N63 Unspecified lump in unspecified breast: Secondary | ICD-10-CM | POA: Diagnosis not present

## 2022-11-24 DIAGNOSIS — N1831 Chronic kidney disease, stage 3a: Secondary | ICD-10-CM | POA: Diagnosis not present

## 2022-11-24 DIAGNOSIS — E113293 Type 2 diabetes mellitus with mild nonproliferative diabetic retinopathy without macular edema, bilateral: Secondary | ICD-10-CM | POA: Diagnosis not present

## 2022-11-26 ENCOUNTER — Other Ambulatory Visit: Payer: Self-pay | Admitting: Internal Medicine

## 2022-11-26 DIAGNOSIS — N6320 Unspecified lump in the left breast, unspecified quadrant: Secondary | ICD-10-CM

## 2022-12-08 ENCOUNTER — Ambulatory Visit
Admission: RE | Admit: 2022-12-08 | Discharge: 2022-12-08 | Disposition: A | Discharge status: Home / Self Care | Payer: Medicare Other | Source: Ambulatory Visit | Attending: Internal Medicine | Admitting: Internal Medicine

## 2022-12-08 ENCOUNTER — Other Ambulatory Visit: Payer: Self-pay | Admitting: Internal Medicine

## 2022-12-08 DIAGNOSIS — N6342 Unspecified lump in left breast, subareolar: Secondary | ICD-10-CM | POA: Diagnosis not present

## 2022-12-08 DIAGNOSIS — N6320 Unspecified lump in the left breast, unspecified quadrant: Secondary | ICD-10-CM

## 2022-12-08 DIAGNOSIS — R590 Localized enlarged lymph nodes: Secondary | ICD-10-CM | POA: Diagnosis not present

## 2022-12-08 DIAGNOSIS — Z803 Family history of malignant neoplasm of breast: Secondary | ICD-10-CM | POA: Diagnosis not present

## 2022-12-08 DIAGNOSIS — R599 Enlarged lymph nodes, unspecified: Secondary | ICD-10-CM

## 2022-12-09 ENCOUNTER — Ambulatory Visit
Admission: RE | Admit: 2022-12-09 | Discharge: 2022-12-09 | Disposition: A | Discharge status: Home / Self Care | Payer: Medicare Other | Source: Ambulatory Visit | Attending: Internal Medicine | Admitting: Internal Medicine

## 2022-12-09 ENCOUNTER — Other Ambulatory Visit: Payer: Self-pay | Admitting: Internal Medicine

## 2022-12-09 DIAGNOSIS — N6342 Unspecified lump in left breast, subareolar: Secondary | ICD-10-CM | POA: Diagnosis not present

## 2022-12-09 DIAGNOSIS — N6320 Unspecified lump in the left breast, unspecified quadrant: Secondary | ICD-10-CM

## 2022-12-09 DIAGNOSIS — R599 Enlarged lymph nodes, unspecified: Secondary | ICD-10-CM

## 2022-12-09 DIAGNOSIS — Z170 Estrogen receptor positive status [ER+]: Secondary | ICD-10-CM | POA: Diagnosis not present

## 2022-12-09 DIAGNOSIS — C761 Malignant neoplasm of thorax: Secondary | ICD-10-CM | POA: Diagnosis not present

## 2022-12-09 DIAGNOSIS — C50122 Malignant neoplasm of central portion of left male breast: Secondary | ICD-10-CM | POA: Diagnosis not present

## 2022-12-09 DIAGNOSIS — R590 Localized enlarged lymph nodes: Secondary | ICD-10-CM | POA: Diagnosis not present

## 2022-12-09 HISTORY — PX: BREAST BIOPSY: SHX20

## 2022-12-19 ENCOUNTER — Ambulatory Visit: Payer: Self-pay | Admitting: Surgery

## 2022-12-19 ENCOUNTER — Telehealth: Payer: Self-pay | Admitting: *Deleted

## 2022-12-19 ENCOUNTER — Telehealth: Payer: Self-pay | Admitting: Radiation Oncology

## 2022-12-19 ENCOUNTER — Other Ambulatory Visit: Payer: Self-pay | Admitting: *Deleted

## 2022-12-19 ENCOUNTER — Telehealth: Payer: Self-pay | Admitting: Hematology and Oncology

## 2022-12-19 DIAGNOSIS — C50022 Malignant neoplasm of nipple and areola, left male breast: Secondary | ICD-10-CM

## 2022-12-19 DIAGNOSIS — C50922 Malignant neoplasm of unspecified site of left male breast: Secondary | ICD-10-CM | POA: Insufficient documentation

## 2022-12-19 DIAGNOSIS — Z170 Estrogen receptor positive status [ER+]: Secondary | ICD-10-CM

## 2022-12-19 NOTE — Telephone Encounter (Signed)
6/28 @ 10:50 am patient requested for his appointment information to be sent secure via email due to having issues in using his mychart. Information sent.

## 2022-12-19 NOTE — Telephone Encounter (Signed)
Pt called back and has been scheduled for tele pre op appt 12/24/22. Pt has malignant breast cancer. Med rec and consent are done.

## 2022-12-19 NOTE — Telephone Encounter (Signed)
Left message to call back to set up tele pre op appt.  

## 2022-12-19 NOTE — Telephone Encounter (Signed)
   Name: Jesse Hoffman  DOB: 10/05/1935  MRN: [id number]  Primary Cardiologist: Dr. Royann Shivers   Preoperative team, please contact this patient and set up a phone call appointment for further preoperative risk assessment. Please obtain consent and complete medication review. Thank you for your help.  I confirm that guidance regarding antiplatelet and oral anticoagulation therapy has been completed and, if necessary, noted below.  None Requested    Napoleon Form, Leodis Rains, NP 12/19/2022, 12:41 PM [facility]

## 2022-12-19 NOTE — Telephone Encounter (Signed)
scheduled per referral , pt has been called and confirmed date and time. Pt is aware of location and to arrive early for check in, pt requested same day appts for med onc and rad onc

## 2022-12-19 NOTE — Telephone Encounter (Signed)
   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 MATTJEW TSUEI Surgeon's Group or Practice Name:  [facility] Phone number:  [phone] Fax number:  [phone] ATTN: Michel Bickers, LPN   Type of Clearance Requested:   - Medical ; NO MEDICATIONS LISTED AS NEEDING TO BE HELD   Type of Anesthesia:  General    Additional requests/questions:    Elpidio Anis   12/19/2022, 12:28 PM

## 2022-12-19 NOTE — Telephone Encounter (Signed)
Pt called back and has been scheduled for tele pre op appt 12/24/22. Pt has malignant breast cancer. Med rec and consent are done.     Patient Consent for Virtual Visit        [name] has provided verbal consent on 12/19/2022 for a virtual visit (video or telephone).   CONSENT FOR VIRTUAL VISIT FOR:  Jesse Hoffman  By participating in this virtual visit I agree to the following:  I hereby voluntarily request, consent and authorize [facility] and its employed or contracted physicians, physician assistants, nurse practitioners or other licensed health care professionals (the Practitioner), to provide me with telemedicine health care services (the "Services") as deemed necessary by the treating Practitioner. I acknowledge and consent to receive the Services by the Practitioner via telemedicine. I understand that the telemedicine visit will involve communicating with the Practitioner through live audiovisual communication technology and the disclosure of certain medical information by electronic transmission. I acknowledge that I have been given the opportunity to request an in-person assessment or other available alternative prior to the telemedicine visit and am voluntarily participating in the telemedicine visit.  I understand that I have the right to withhold or withdraw my consent to the use of telemedicine in the course of my care at any time, without affecting my right to future care or treatment, and that the Practitioner or I may terminate the telemedicine visit at any time. I understand that I have the right to inspect all information obtained and/or recorded in the course of the telemedicine visit and may receive copies of available information for a reasonable fee.  I understand that some of the potential risks of receiving the Services via telemedicine include:  Delay or interruption in medical evaluation due to technological equipment failure or  disruption; Information transmitted may not be sufficient (e.g. poor resolution of images) to allow for appropriate medical decision making by the Practitioner; and/or  In rare instances, security protocols could fail, causing a breach of personal health information.  Furthermore, I acknowledge that it is my responsibility to provide information about my medical history, conditions and care that is complete and accurate to the best of my ability. I acknowledge that Practitioner's advice, recommendations, and/or decision may be based on factors not within their control, such as incomplete or inaccurate data provided by me or distortions of diagnostic images or specimens that may result from electronic transmissions. I understand that the practice of medicine is not an exact science and that Practitioner makes no warranties or guarantees regarding treatment outcomes. I acknowledge that a copy of this consent can be made available to me via my patient portal [facility] MyChart), or I can request a printed copy by calling the office of  [facility].    I understand that my insurance will be billed for this visit.   I have read or had this consent read to me. I understand the contents of this consent, which adequately explains the benefits and risks of the Services being provided via telemedicine.  I have been provided ample opportunity to ask questions regarding this consent and the Services and have had my questions answered to my satisfaction. I give my informed consent for the services to be provided through the use of telemedicine in my medical care

## 2022-12-20 ENCOUNTER — Ambulatory Visit: Payer: Self-pay | Admitting: Surgery

## 2022-12-20 NOTE — H&P (Signed)
Subjective   Chief Complaint: New Consultation (LFT BREAST)       History of Present Illness: Jesse Hoffman is a 87 y.o. male who is seen today as an office consultation at the request of Dr. Nicholos Johns for evaluation of New Consultation (LFT BREAST) .   This is an 87 year old male in relatively good health who presents with a strong family history of breast cancer.  The patient had 2 sisters that were both diagnosed with breast cancer in their 29s.  The patient also has a daughter who was diagnosed and treated for breast cancer also in her 80s.  Over the last 3 months, the patient has noticed an enlarging firm palpable mass behind his left nipple.  This has become fairly large and uncomfortable.  He brought this to the attention of his primary care physician who sent him for mammogram and ultrasound.  The ultrasound showed only some right breast gynecomastia.  However the left breast showed a 3.9 cm suspicious solid mass in the retroareolar left breast tissue.  Ultrasound of the axilla showed for abnormal appearing axillary lymph nodes.  He underwent subsequent biopsy of the breast mass as well as one of the axillary lymph nodes.  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  Biopsy of the axillary lymph node showed metastatic carcinoma with no residual lymph node tissue identified.  The patient is referred to [place] to discuss surgical options.  He has also been referred to oncology and radiation oncology.  His appointments are next week.  He is accompanied by his wife.  His wife also had breast cancer.   The patient reports no previous breast problems.  He and his wife think that their daughter underwent genetic testing and was negative.   The patient is in relatively good health.  He and his wife live independently.  He continues to drive.  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  He is on no blood thinners.  He was seen  last year by cardiology for heart murmur and was found to have some aortic stenosis.     Review of Systems: A complete review of systems was obtained from the patient.  I have reviewed this information and discussed as appropriate with the patient.  See HPI as well for other ROS.   Review of Systems  Constitutional: Negative.   HENT: Negative.    Eyes: Negative.   Respiratory: Negative.    Cardiovascular: Negative.   Gastrointestinal: Negative.   Genitourinary: Negative.   Musculoskeletal: Negative.   Skin: Negative.   Neurological: Negative.   Endo/Heme/Allergies: Negative.   Psychiatric/Behavioral: Negative.          Medical History: Past Medical History Past Medical History: Diagnosis Date  Diabetes mellitus without complication (CMS/HHS-HCC)    History of cancer        Problem List Patient Active Problem List Diagnosis  Invasive ductal carcinoma of breast, left (CMS/HHS-HCC)      Past Surgical History Past Surgical History: Procedure Laterality Date  CHOLECYSTECTOMY          Allergies No Known Allergies    Medications Ordered Prior to Encounter Current Outpatient Medications on File Prior to Visit Medication Sig Dispense Refill  amLODIPine (NORVASC) 5 MG tablet Take 1 tablet by mouth once daily      atorvastatin (LIPITOR) 40 MG tablet Take 1 tablet by mouth once daily      dapagliflozin propanediol (FARXIGA) 10  mg tablet Take 1 tablet by mouth every morning      KERENDIA 10 mg Tab Take 1 tablet by mouth once daily      losartan (COZAAR) 50 MG tablet Take 1 tablet by mouth 2 (two) times daily      metFORMIN (GLUCOPHAGE-XR) 500 MG XR tablet TAKE 1 TABLET BY MOUTH EVERY DAY WITH THE EVENING MEAL      omeprazole (PRILOSEC) 20 MG DR capsule Take 20 mg by mouth once daily        No current facility-administered medications on file prior to visit.      Family History Family History Problem Relation Age of Onset  High blood pressure  (Hypertension) Father    Coronary Artery Disease (Blocked arteries around heart) Father    Diabetes Sister    Breast cancer Sister        Tobacco Use History Social History    Tobacco Use Smoking Status Former  Types: Cigarettes Smokeless Tobacco Never      Social History Social History    Socioeconomic History  Marital status: Unknown Tobacco Use  Smoking status: Former     Types: Cigarettes  Smokeless tobacco: Never Vaping Use  Vaping status: Never Used Substance and Sexual Activity  Alcohol use: Yes  Drug use: Never      Objective:     Vitals:   12/19/22 1124 BP: (!) 140/70 Pulse: 89 Temp: 36.6 C (97.9 F) SpO2: 97% Weight: 72.3 kg (159 lb 6.4 oz) Height: 179.1 cm (5' 10.5") PainSc: 0-No pain   Body mass index is 22.55 kg/m.   Physical Exam    Constitutional:  WDWN in NAD, conversant, no obvious deformities; lying in bed comfortably Eyes:  Pupils equal, round; sclera anicteric; moist conjunctiva; no lid lag HENT:  Oral mucosa moist; good dentition  Neck:  No masses palpated, trachea midline; no thyromegaly Lungs:  CTA bilaterally; normal respiratory effort Breasts:  symmetric, no nipple changes; right breast shows no palpable masses.  No axillary lymphadenopathy.  Left breast shows some faint palpable lymph nodes in the axilla.  The left breast shows a firm 4 cm mass in the retroareolar space extending up above the areola.  No overlying skin changes.  There seems to be some involvement of the skin of the nipple itself.  This mass seems to be fixed to the underlying tissue. CV:  Regular rate and rhythm; no murmurs; extremities well-perfused with no edema Abd:  +bowel sounds, soft, non-tender, no palpable organomegaly; no palpable hernias Musc:  Normal gait; no apparent clubbing or cyanosis in extremities Lymphatic:  No palpable cervical or axillary lymphadenopathy Skin:  Warm, dry; no sign of jaundice Psychiatric - alert and oriented x 4; calm mood  and affect     Labs, Imaging and Diagnostic Testing:   Diagnosis 1. Breast, left, needle core biopsy, retroareolar, mass INVASIVE DUCTAL CARCINOMA, SEE NOTE TUBULE FORMATION: SCORE 3 NUCLEAR PLEOMORPHISM: SCORE 2 MITOTIC COUNT: SCORE 1 TOTAL SCORE: 6 OVERALL GRADE: 2 LYMPHOVASCULAR INVASION: PRESENT CANCER LENGTH: 1.9 CM CALCIFICATIONS: NOT IDENTIFIED OTHER FINDINGS: NONE SEE NOTE 2. Lymph node, needle/core biopsy, axilla, lymph node INVASIVE CARCINOMA INVOLVING FIBROADIPOSE TISSUE NO RESIDUAL LYMPH NODE TISSUE IDENTIFIED Diagnosis Note 1. Dr. Reynolds Bowl reviewed the case and concurs with the interpretation. A breast prognostic profile (ER, PR, Ki-67 and HER2) is pending and will be reported in an addendum. [facility] was notified on 12/10/2022. Jimmy Picket MD Pathologist, Electronic Signature (Case signed 12/10/2022)   LUORESCENCE IN-SITU HYBRIDIZATION Results:  GROUP 5: HER2 **NEGATIVE** Equivocal form of amplification of the HER2 gene was detected in the IHC 2+ tissue sample received from this individual. HER2 FISH was performed by a technologist and cell imaging and analysis on the [employer]. RATIO OF HER2/CEN17 SIGNALS 1.08 AVERAGE HER2 COPY NUMBER PER CELL 2.00 The ratio of HER2/CEN 17 is within the range < 2.0 of HER2/CEN 17 and a copy number of HER2 signals per cell is <4.0. Arch Pathol Lab Med 1:1,2018 Jimmy Picket MD Pathologist, Electronic Signature   The tumor cells are EQUIVOCAL for Her2 (2+). Her2 by FISH will be performed and the results sent separately. Estrogen Receptor: 100%, POSITIVE, STRONG STAINING INTENSITY Progesterone Receptor: 50%, POSITIVE, MODERATE-STRONG STAINING INTENSITY Proliferation Marker Ki67: 30% REFERENCE RANGE ESTROGEN RECEPTOR NEGATIVE 0% POSITIVE =>1% REFERENCE RANGE PROGESTERONE RECEPTOR NEGATIVE 0% POSITIVE =>1% All controls stained appropriately Jerene Bears MD Pathologist, Electronic Signature CLINICAL DATA:   87 year old male with LEFT breast mass. Strong family history of breast cancer.   EXAM: DIGITAL DIAGNOSTIC BILATERAL MAMMOGRAM WITH TOMOSYNTHESIS; ULTRASOUND RIGHT BREAST LIMITED; ULTRASOUND LEFT BREAST LIMITED   TECHNIQUE: Bilateral digital diagnostic mammography and breast tomosynthesis was performed.; Targeted ultrasound examination of the right breast was performed; Targeted ultrasound examination of the left breast was performed.   COMPARISON:  None available.   ACR Breast Density Category a: The breasts are almost entirely fatty.   FINDINGS: A focal asymmetry/possible mass within the RETROAREOLAR LEFT breast is identified containing a few calcifications with LEFT nipple retraction and skin thickening.   Mild asymmetry in the RIGHT RETROAREOLAR region is noted, most likely representing gynecomastia.   No other significant abnormalities are noted within either breast.   On physical exam, a very firm mass in the LEFT RETROAREOLAR region is noted.   Targeted ultrasound is performed, showing a 2.7 x 1.6 x 3.9 cm irregular hypoechoic mass in the RETROAREOLAR LEFT breast.   Four abnormal LEFT axillary lymph nodes are noted with cortical thickening of up to 6 mm.   A small amount of normal fibroglandular tissue in the RETROAREOLAR RIGHT breast is identified. No mass, distortion or abnormal shadowing in the RETROAREOLAR RIGHT breast noted.   IMPRESSION: 1.  3.9 cm highly suspicious mass in the RETROAREOLAR LEFT breast with 4 abnormal LEFT axillary lymph nodes. Tissue sampling of theLEFT breast mass and 1 of the abnormal lymph nodes is recommended. 2. Mild RIGHT gynecomastia.   RECOMMENDATION: Ultrasound-guided biopsies of the LEFT breast mass and 1 of the abnormal LEFT axillary lymph nodes, which will be scheduled.   I have discussed the findings and recommendations with the patient. If applicable, a reminder letter will be sent to the patient regarding the next  appointment.   BI-RADS CATEGORY  5: Highly suggestive of malignancy.     Electronically Signed   By: Harmon Pier M.D.   On: 12/08/2022 09:56         Assessment and Plan: Diagnoses and all orders for this visit:   Invasive ductal carcinoma of breast, left (CMS/HHS-HCC)   I had a long discussion with the patient and his wife regarding his disease and the recommendations for treatment.  We have placed referrals to oncology and radiation oncology.  Those appointments will occur next week.  From a surgical standpoint, I think his surgical options are limited to a left mastectomy with a targeted axillary lymph node dissection.  Due to his excellent medical condition, it is possible that oncology may offer him neoadjuvant chemotherapy.  We went ahead and discussed  the possible placement of a Port-A-Cath if indicated.  However, if they feel that he would not be a good candidate for chemotherapy, we will proceed with scheduling a left mastectomy with a left targeted axillary lymph node dissection after receiving cardiac clearance.  We will discuss this further with the patient after his oncology consultations next week.             Lissa Morales, MD  12/20/2022 4:52 PM

## 2022-12-22 ENCOUNTER — Telehealth: Payer: Self-pay

## 2022-12-22 NOTE — Progress Notes (Signed)
Radiation Oncology         [phone]) [phone] ________________________________  Initial Outpatient Consultation  Name: Jesse Hoffman MRN: [id number]  Date: 12/23/2022  DOB: 08-12-1935  NW:[id number], Campbell Lerner, MD  Manus Rudd, MD   REFERRING PHYSICIAN: Manus Rudd, MD  DIAGNOSIS:    ICD-10-CM   1. 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 male who presented to his PCP last month with an enlarging palpable mass behind his left nipple which he first noticed this past March (approximately). He subsequently presented for a bilateral diagnostic breast mammogram and bilateral breast ultrasound on 12/08/22 which demonstrated: a highly suspicious mass in the retroareolar left breast measuring 2.7 x 1.6 x 3.9 cm, and at least 4 abnormal left axillary lymph nodes. A small amount of normal fibroglandular tissue in the retroareolar right breast was also identified. No definite evidence of malignancy was appreciated in the right breast otherwise.   Biopsy of the retroareolar left breast mass on date of 12/09/22 showed grade 2 invasive ductal carcinoma measuring 1.9 cm in the greatest linear extent of the sample; positive for LVI.  ER status: 100% positive with strong staining intensity; PR status 50% positive with moderate staining  intensity; Proliferation marker Ki67 at 30%; Her2 status negative; Grade 2. Biopsy of a left axillary lymph node came back positive for invasive carcinoma (with no residual lymph node tissue identified).   Accordingly, the patient was referred to Dr. Corliss Skains on 0628/24 to discuss treatment options. In terms of surgical treatment options, Dr. Corliss Skains recommends a left mastectomy with targeted axillary lymph node dissection. A final surgical decision is dependant of his evaluation by medical oncology and our discussion today. If neoadjuvant chemotherapy is recommended per medical oncology, Dr. Corliss Skains has already discussed the possibility proceeding with port placement if indicated.   He is scheduled to meet with Dr. Pamelia Hoit later this afternoon.   Of note: The patient has a strong family history of breast cancer. The patient had 2 sisters that were both diagnosed with breast cancer in their 80's. The patient also has a daughter who was diagnosed and treated for breast cancer (also in her 70's).   Today he reports to be doing well overall. He denies any breast pain, nipple discharge or bleeding.   PREVIOUS RADIATION THERAPY: No  PAST MEDICAL HISTORY:  has a past medical history of Ascending cholangitis (2006, 01/2015), Cholangitis (01/2015), Cholecystitis, acute with cholelithiasis (2006), CKD (chronic kidney disease), stage III (HCC), Diabetes mellitus without complication (HCC), GERD (gastroesophageal reflux disease), Gout (July 2013), Hepatitis B antibody positive, HLD (hyperlipidemia), Hypertension, Thrombocytopenia (HCC) (01/2015), and Tubular adenoma of colon (09/2012).    PAST SURGICAL HISTORY: Past Surgical History:  Procedure Laterality Date   BREAST BIOPSY Left 12/09/2022   [place] LT BREAST BX W LOC DEV 1ST LESION IMG BX SPEC US  GUIDE 12/09/2022 [facility] MAMMOGRAPHY   ERCP N/A 02/03/2015   Procedure: ENDOSCOPIC RETROGRADE CHOLANGIOPANCREATOGRAPHY (ERCP);  Surgeon: Jeani Hawking, MD;  Location: [facility] ENDOSCOPY;   Service: Endoscopy;  Laterality: N/A;   ERCP N/A 05/14/2017   Procedure: ENDOSCOPIC RETROGRADE CHOLANGIOPANCREATOGRAPHY (ERCP);  Surgeon: Meryl Dare, MD;  Location: [facility] ENDOSCOPY;  Service: Endoscopy;  Laterality: N/A;   ERCP W/ SPHICTEROTOMY  06/2004    Ampulla in a diverticulum with multiple stones, found in the  common bile duct   fracture arm     age 87   LAPAROSCOPIC CHOLECYSTECTOMY  06/2004   dr Abbey Chatters   WISDOM TOOTH EXTRACTION      FAMILY HISTORY: family history includes Breast cancer in his cousin, sister, and sister; Breast cancer (age of onset: 76) in his daughter; Cancer in his maternal uncle; Emphysema in his father; Heart attack in his father; Heart disease in his mother; Multiple sclerosis in his sister; Prostate cancer in his brother and brother.  SOCIAL HISTORY:  reports that he quit smoking about 59 years ago. His smoking use included cigarettes. He has never used smokeless tobacco. He reports that he does not currently use alcohol. He reports that he does not use drugs.  ALLERGIES: Allopurinol  MEDICATIONS:  Current Outpatient Medications  Medication Sig Dispense Refill   Alpha Lipoic Acid 200 MG CAPS Take 200 mg by mouth daily in the afternoon.     amLODipine (NORVASC) 5 MG tablet Take 1 tablet by mouth daily.     atorvastatin (LIPITOR) 40 MG tablet Take 1 tablet (40 mg total) by mouth daily at 6 PM. Restart after 1 week, when Liver function tests normalize     B Complex CAPS Take 1 capsule by mouth every other day.     cetirizine (ZYRTEC ALLERGY) 10 MG tablet Take by mouth daily as needed.     Colchicine 0.6 MG CAPS Take 1 tablet by mouth daily.     FARXIGA 10 MG TABS tablet Take 10 mg by mouth daily.     hydrOXYzine (ATARAX/VISTARIL) 25 MG tablet Take 1 tablet (25 mg total) every 6 (six) hours as needed by mouth for itching. 30 tablet 0   KERENDIA 10 MG TABS Take 1 tablet by mouth daily.     Lancets (ONETOUCH DELICA PLUS LANCET33G) MISC Apply topically 2 (two)  times daily.     LORazepam (ATIVAN) 1 MG tablet Take 1 tablet (1 mg total) every 6 (six) hours as needed by mouth for sleep. 15 tablet 0   losartan (COZAAR) 50 MG tablet Take 1 tablet by mouth 2 (two) times daily.     metFORMIN (GLUCOPHAGE-XR) 500 MG 24 hr tablet Take 1 tablet (500 mg total) by mouth every evening. Restart om 05/20/17 30 tablet 1   Multiple Vitamin (MULTIVITAMIN WITH MINERALS) TABS tablet Take 1 tablet daily by mouth.     omeprazole (PRILOSEC) 20 MG capsule Take 1 capsule (20 mg total) by mouth daily. 30 capsule 3   ONETOUCH VERIO test strip in the morning and at bedtime.     repaglinide (PRANDIN) 1 MG tablet Take 1 mg by mouth daily in the afternoon.     triamterene-hydrochlorothiazide (MAXZIDE) 75-50 MG tablet Take 0.5 tablets by mouth every morning. 15 tablet 0   No current facility-administered medications for this encounter.    REVIEW OF SYSTEMS: As above in HPI.   PHYSICAL EXAM:  height is 5\' 11"  (1.803 m) and weight is 161 lb (73 kg). His temporal temperature is 97.3 F (  36.3 C) (abnormal). His blood pressure is 147/81 (abnormal) and his pulse is 81. His oxygen saturation is 99%.   General: Alert and oriented, in no acute distress HEENT: Head is normocephalic. Extraocular movements are intact. Oropharynx is clear. Neck: Neck is supple, no palpable cervical or supraclavicular lymphadenopathy. Heart: Regular in rate and rhythm with no murmurs, rubs, or gallops. Chest: Clear to auscultation bilaterally, with no rhonchi, wheezes, or rales. Abdomen: Soft, nontender, nondistended, with no rigidity or guarding. Extremities: No cyanosis or edema. Lymphatics: see Neck Exam Skin: No concerning lesions. Musculoskeletal: symmetric strength and muscle tone throughout. Neurologic: Cranial nerves II through XII are grossly intact. No obvious focalities. Speech is fluent. Coordination is intact. Psychiatric: Judgment and insight are intact. Affect is appropriate. Breasts:  Palpable 3cm left breast mass beneath the areola. Dry skin circumferential to the areola. Nipple with no discharge. bruising in the lower outer qudrant from at the area of the biopsy. No other palpable masses appreciated in the breasts or axillae.   ECOG = 0  0 - Asymptomatic (Fully active, able to carry on all predisease activities without restriction)  1 - Symptomatic but completely ambulatory (Restricted in physically strenuous activity but ambulatory and able to carry out work of a light or sedentary nature. For example, light housework, office work)  2 - Symptomatic, <50% in bed during the day (Ambulatory and capable of all self care but unable to carry out any work activities. Up and about more than 50% of waking hours)  3 - Symptomatic, >50% in bed, but not bedbound (Capable of only limited self-care, confined to bed or chair 50% or more of waking hours)  4 - Bedbound (Completely disabled. Cannot carry on any self-care. Totally confined to bed or chair)  5 - Death   Santiago Glad MM, Creech RH, Tormey DC, et al. [phone]). "Toxicity and response criteria of the [facility]". Am. Evlyn Clines. Oncol. 5 (6): 649-55   LABORATORY DATA:  Lab Results  Component Value Date   WBC 4.8 10/26/2020   HGB 11.9 (L) 10/26/2020   HCT 32.9 (L) 10/26/2020   MCV 87.3 10/26/2020   PLT 205 10/26/2020   CMP     Component Value Date/Time   NA 129 (L) 10/26/2020 1413   K 3.7 10/26/2020 1413   CL 94 (L) 10/26/2020 1413   CO2 23 10/26/2020 1413   GLUCOSE 157 (H) 10/26/2020 1413   BUN 20 10/26/2020 1413   CREATININE 1.43 (H) 10/26/2020 1413   CALCIUM 9.4 10/26/2020 1413   PROT 7.1 10/26/2020 1413   ALBUMIN 4.0 10/26/2020 1413   AST 22 10/26/2020 1413   ALT 20 10/26/2020 1413   ALKPHOS 97 10/26/2020 1413   BILITOT 1.6 (H) 10/26/2020 1413   GFRNONAA 49 (L) 10/26/2020 1413   GFRAA 52 (L) 05/17/2017 0526         RADIOGRAPHY: [place] LT BREAST BX W LOC DEV 1ST LESION IMG BX SPEC US  GUIDE  Addendum Date: 12/11/2022   ADDENDUM REPORT: 12/11/2022 14:12 ADDENDUM: Pathology revealed GRADE II INVASIVE DUCTAL CARCINOMA, LYMPHOVASCULAR INVASION: PRESENT of the LEFT breast, retroareolar, (venus clip). This was found to be concordant by Dr. Emmaline Kluver. Pathology revealed INVASIVE CARCINOMA INVOLVING FIBROADIPOSE TISSUE, NO RESIDUAL LYMPH NODE TISSUE IDENTIFIED of the LEFT axilla, (hydromark clip). This was found to be concordant by Dr. Emmaline Kluver. Pathology results were discussed with the patient and his wife, Dajour Buffone, by telephone. The patient reported doing well after the biopsies with tenderness at  the sites. Post biopsy instructions and care were reviewed and questions were answered. The patient was encouraged to call [facility] for any additional concerns. My direct phone number was provided. Surgical consultation has been arranged with Dr. Manus Rudd at [facility] on December 19, 2022. Pathology results reported by Rene Kocher, RN on 12/11/2022. Electronically Signed   By: Emmaline Kluver M.D.   On: 12/11/2022 14:12   Result Date: 12/11/2022 CLINICAL DATA:  87 year old male presenting for biopsy of a left breast mass and left axillary lymph node. EXAM: ULTRASOUND GUIDED LEFT BREAST CORE NEEDLE BIOPSY [place] AXILLARY NODE CORE BIOPSY LEFT COMPARISON:  Previous exam(s). PROCEDURE: I met with the patient and we discussed the procedure of ultrasound-guided biopsy, including benefits and alternatives. We discussed the high likelihood of a successful procedure. We discussed the risks of the procedure, including infection, bleeding, tissue injury, clip migration, and inadequate sampling. Informed written consent was given. The usual time-out protocol was performed immediately prior to the procedure. Lesion quadrant: Upper outer quadrant Using sterile technique and 1% Lidocaine as local anesthetic, under direct ultrasound visualization, a 14 gauge  spring-loaded device was used to perform biopsy of a mass in the retroareolar left breast using a lateral approach. At the conclusion of the procedure a Venus shaped tissue marker clip was deployed into the biopsy cavity. Follow up 2 view mammogram was performed and dictated separately. Left axilla: Using sterile technique and 1% Lidocaine as local anesthetic, under direct ultrasound visualization, a 14 gauge spring-loaded device was used to perform biopsy of a left axillary lymph node using a inferior approach. At the conclusion of the procedure a HydroMARK shaped tissue marker clip was deployed into the biopsy cavity. Follow up 2 view mammogram was performed and dictated separately. IMPRESSION: Ultrasound guided biopsy of a mass in the retroareolar left breast and a left axillary lymph node. No apparent complications. Electronically Signed: By: Emmaline Kluver M.D. On: 12/09/2022 15:30  [place] AXILLARY NODE CORE BIOPSY LEFT  Addendum Date: 12/11/2022   ADDENDUM REPORT: 12/11/2022 14:12 ADDENDUM: Pathology revealed GRADE II INVASIVE DUCTAL CARCINOMA, LYMPHOVASCULAR INVASION: PRESENT of the LEFT breast, retroareolar, (venus clip). This was found to be concordant by Dr. Emmaline Kluver. Pathology revealed INVASIVE CARCINOMA INVOLVING FIBROADIPOSE TISSUE, NO RESIDUAL LYMPH NODE TISSUE IDENTIFIED of the LEFT axilla, (hydromark clip). This was found to be concordant by Dr. Emmaline Kluver. Pathology results were discussed with the patient and his wife, Kanav Palermo, by telephone. The patient reported doing well after the biopsies with tenderness at the sites. Post biopsy instructions and care were reviewed and questions were answered. The patient was encouraged to call [facility] for any additional concerns. My direct phone number was provided. Surgical consultation has been arranged with Dr. Manus Rudd at [facility] on December 19, 2022. Pathology results reported by Rene Kocher, RN on 12/11/2022. Electronically Signed   By: Emmaline Kluver M.D.   On: 12/11/2022 14:12   Result Date: 12/11/2022 CLINICAL DATA:  87 year old male presenting for biopsy of a left breast mass and left axillary lymph node. EXAM: ULTRASOUND GUIDED LEFT BREAST CORE NEEDLE BIOPSY [place] AXILLARY NODE CORE BIOPSY LEFT COMPARISON:  Previous exam(s). PROCEDURE: I met with the patient and we discussed the procedure of ultrasound-guided biopsy, including benefits and alternatives. We discussed the high likelihood of a successful procedure. We discussed the risks of the procedure, including infection, bleeding, tissue injury, clip migration, and inadequate sampling. Informed  written consent was given. The usual time-out protocol was performed immediately prior to the procedure. Lesion quadrant: Upper outer quadrant Using sterile technique and 1% Lidocaine as local anesthetic, under direct ultrasound visualization, a 14 gauge spring-loaded device was used to perform biopsy of a mass in the retroareolar left breast using a lateral approach. At the conclusion of the procedure a Venus shaped tissue marker clip was deployed into the biopsy cavity. Follow up 2 view mammogram was performed and dictated separately. Left axilla: Using sterile technique and 1% Lidocaine as local anesthetic, under direct ultrasound visualization, a 14 gauge spring-loaded device was used to perform biopsy of a left axillary lymph node using a inferior approach. At the conclusion of the procedure a HydroMARK shaped tissue marker clip was deployed into the biopsy cavity. Follow up 2 view mammogram was performed and dictated separately. IMPRESSION: Ultrasound guided biopsy of a mass in the retroareolar left breast and a left axillary lymph node. No apparent complications. Electronically Signed: By: Emmaline Kluver M.D. On: 12/09/2022 15:30  MM CLIP PLACEMENT LEFT  Result Date: 12/09/2022 CLINICAL DATA:  Post procedure mammogram for clip  placement. EXAM: 3D DIAGNOSTIC LEFT MAMMOGRAM POST ULTRASOUND BIOPSY COMPARISON:  Previous exam(s). FINDINGS: 3D Mammographic images were obtained following ultrasound guided biopsy of a mass in the retroareolar left breast. The biopsy marking clip is in expected position at the site of biopsy. 3D Mammographic images were obtained following ultrasound guided biopsy of a left axillary lymph node. The HydroMARK clip in the left axilla could not be visualized due to far posterior location. IMPRESSION: 1. Appropriate placement of the Venus biopsy marking clip in the left breast. 2. The HydroMARK clip in the left axilla could not be visualized due to far posterior location. Final Assessment: Post Procedure Mammograms for Marker Placement Electronically Signed   By: Emmaline Kluver M.D.   On: 12/09/2022 16:06  MM 3D DIAGNOSTIC MAMMOGRAM BILATERAL BREAST  Result Date: 12/08/2022 CLINICAL DATA:  87 year old male with LEFT breast mass. Strong family history of breast cancer. EXAM: DIGITAL DIAGNOSTIC BILATERAL MAMMOGRAM WITH TOMOSYNTHESIS; ULTRASOUND RIGHT BREAST LIMITED; ULTRASOUND LEFT BREAST LIMITED TECHNIQUE: Bilateral digital diagnostic mammography and breast tomosynthesis was performed.; Targeted ultrasound examination of the right breast was performed; Targeted ultrasound examination of the left breast was performed. COMPARISON:  None available. ACR Breast Density Category a: The breasts are almost entirely fatty. FINDINGS: A focal asymmetry/possible mass within the RETROAREOLAR LEFT breast is identified containing a few calcifications with LEFT nipple retraction and skin thickening. Mild asymmetry in the RIGHT RETROAREOLAR region is noted, most likely representing gynecomastia. No other significant abnormalities are noted within either breast. On physical exam, a very firm mass in the LEFT RETROAREOLAR region is noted. Targeted ultrasound is performed, showing a 2.7 x 1.6 x 3.9 cm irregular hypoechoic mass in  the RETROAREOLAR LEFT breast. Four abnormal LEFT axillary lymph nodes are noted with cortical thickening of up to 6 mm. A small amount of normal fibroglandular tissue in the RETROAREOLAR RIGHT breast is identified. No mass, distortion or abnormal shadowing in the RETROAREOLAR RIGHT breast noted. IMPRESSION: 1. 3.9 cm highly suspicious mass in the RETROAREOLAR LEFT breast with 4 abnormal LEFT axillary lymph nodes. Tissue sampling of the LEFT breast mass and 1 of the abnormal lymph nodes is recommended. 2. Mild RIGHT gynecomastia. RECOMMENDATION: Ultrasound-guided biopsies of the LEFT breast mass and 1 of the abnormal LEFT axillary lymph nodes, which will be scheduled. I have discussed the findings and recommendations with the patient. If  applicable, a reminder letter will be sent to the patient regarding the next appointment. BI-RADS CATEGORY  5: Highly suggestive of malignancy. Electronically Signed   By: Harmon Pier M.D.   On: 12/08/2022 09:56  [place] LIMITED ULTRASOUND INCLUDING AXILLA RIGHT BREAST  Result Date: 12/08/2022 CLINICAL DATA:  87 year old male with LEFT breast mass. Strong family history of breast cancer. EXAM: DIGITAL DIAGNOSTIC BILATERAL MAMMOGRAM WITH TOMOSYNTHESIS; ULTRASOUND RIGHT BREAST LIMITED; ULTRASOUND LEFT BREAST LIMITED TECHNIQUE: Bilateral digital diagnostic mammography and breast tomosynthesis was performed.; Targeted ultrasound examination of the right breast was performed; Targeted ultrasound examination of the left breast was performed. COMPARISON:  None available. ACR Breast Density Category a: The breasts are almost entirely fatty. FINDINGS: A focal asymmetry/possible mass within the RETROAREOLAR LEFT breast is identified containing a few calcifications with LEFT nipple retraction and skin thickening. Mild asymmetry in the RIGHT RETROAREOLAR region is noted, most likely representing gynecomastia. No other significant abnormalities are noted within either breast. On physical exam, a  very firm mass in the LEFT RETROAREOLAR region is noted. Targeted ultrasound is performed, showing a 2.7 x 1.6 x 3.9 cm irregular hypoechoic mass in the RETROAREOLAR LEFT breast. Four abnormal LEFT axillary lymph nodes are noted with cortical thickening of up to 6 mm. A small amount of normal fibroglandular tissue in the RETROAREOLAR RIGHT breast is identified. No mass, distortion or abnormal shadowing in the RETROAREOLAR RIGHT breast noted. IMPRESSION: 1. 3.9 cm highly suspicious mass in the RETROAREOLAR LEFT breast with 4 abnormal LEFT axillary lymph nodes. Tissue sampling of the LEFT breast mass and 1 of the abnormal lymph nodes is recommended. 2. Mild RIGHT gynecomastia. RECOMMENDATION: Ultrasound-guided biopsies of the LEFT breast mass and 1 of the abnormal LEFT axillary lymph nodes, which will be scheduled. I have discussed the findings and recommendations with the patient. If applicable, a reminder letter will be sent to the patient regarding the next appointment. BI-RADS CATEGORY  5: Highly suggestive of malignancy. Electronically Signed   By: Harmon Pier M.D.   On: 12/08/2022 09:56  [place] LIMITED ULTRASOUND INCLUDING AXILLA LEFT BREAST   Result Date: 12/08/2022 CLINICAL DATA:  87 year old male with LEFT breast mass. Strong family history of breast cancer. EXAM: DIGITAL DIAGNOSTIC BILATERAL MAMMOGRAM WITH TOMOSYNTHESIS; ULTRASOUND RIGHT BREAST LIMITED; ULTRASOUND LEFT BREAST LIMITED TECHNIQUE: Bilateral digital diagnostic mammography and breast tomosynthesis was performed.; Targeted ultrasound examination of the right breast was performed; Targeted ultrasound examination of the left breast was performed. COMPARISON:  None available. ACR Breast Density Category a: The breasts are almost entirely fatty. FINDINGS: A focal asymmetry/possible mass within the RETROAREOLAR LEFT breast is identified containing a few calcifications with LEFT nipple retraction and skin thickening. Mild asymmetry in the RIGHT  RETROAREOLAR region is noted, most likely representing gynecomastia. No other significant abnormalities are noted within either breast. On physical exam, a very firm mass in the LEFT RETROAREOLAR region is noted. Targeted ultrasound is performed, showing a 2.7 x 1.6 x 3.9 cm irregular hypoechoic mass in the RETROAREOLAR LEFT breast. Four abnormal LEFT axillary lymph nodes are noted with cortical thickening of up to 6 mm. A small amount of normal fibroglandular tissue in the RETROAREOLAR RIGHT breast is identified. No mass, distortion or abnormal shadowing in the RETROAREOLAR RIGHT breast noted. IMPRESSION: 1. 3.9 cm highly suspicious mass in the RETROAREOLAR LEFT breast with 4 abnormal LEFT axillary lymph nodes. Tissue sampling of the LEFT breast mass and 1 of the abnormal lymph nodes is recommended. 2. Mild RIGHT gynecomastia. RECOMMENDATION: Ultrasound-guided biopsies  of the LEFT breast mass and 1 of the abnormal LEFT axillary lymph nodes, which will be scheduled. I have discussed the findings and recommendations with the patient. If applicable, a reminder letter will be sent to the patient regarding the next appointment. BI-RADS CATEGORY  5: Highly suggestive of malignancy. Electronically Signed   By: Harmon Pier M.D.   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 Patient is undecided about whether to proceed with a mastectomy or lumpectomy. Pathology revealed malignancy in his left axillary lymph nodes. Radiation is recommended, regardless of the surgery he chooses, to decrease his risk of disease recurrence.  We discussed the risks, benefits, and side effects of radiotherapy. I recommend radiotherapy to the left breast and surrounding lymph nodes to reduce his risk of locoregional recurrence by 2/3.  We discussed that radiation  would take approximately 6 weeks to complete and that I would give the patient a few weeks to heal following surgery before starting treatment planning. If chemotherapy were to be given, this would precede radiotherapy. We spoke about acute effects including skin irritation and fatigue as well as much less common late effects including internal organ injury or irritation. We spoke about the latest technology that is used to minimize the risk of late effects for patients undergoing radiotherapy to the breast or chest wall. No guarantees of treatment were given. The patient is enthusiastic about proceeding with treatment. I look forward to participating in the patient's care.  I will await his referral back to me for postoperative follow-up and eventual CT simulation/treatment planning.  Genetics referral placed today given that patient is male and has a strong family history.   On date of service, in total, I spent 60 minutes on this encounter. Patient was seen in person.   __________________________________________    Joyice Faster, PA-C    Lonie Peak, MD  This document serves as a record of services personally performed by Lonie Peak, MD. It was created on her behalf by Neena Rhymes, a trained medical scribe. The creation of this record is based on the scribe's personal observations and the provider's statements to them. This document has been checked and approved by the attending provider.

## 2022-12-22 NOTE — Progress Notes (Signed)
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 Her2 by FISH will be performed and the results sent separately.   Did patient present with symptoms (if so, please note symptoms) or was this found on screening mammography?: Over the last 3 months, the patient has noticed an enlarging firm palpable mass behind his left nipple. This has become fairly large and uncomfortable. He brought this to the attention of his primary care physician who sent him for mammogram and ultrasound.   Past/Anticipated interventions by surgeon, if any: 12/20/2022  Manus Rudd, MD  History of Present Illness: Jesse Hoffman is a 87 y.o. male who is seen today as an office consultation at the request of Dr. Nicholos Johns for evaluation of New Consultation (LFT BREAST) .   This is an 87 year old male in relatively good health who presents with a strong family history of breast cancer.  The patient had 2 sisters that were both diagnosed with breast cancer in their 58s.  The patient also has a daughter who was diagnosed and treated for breast cancer also in her 67s.  Over the last 3 months, the patient has noticed an enlarging firm palpable mass behind his left nipple.  This has become fairly large and uncomfortable.  He brought this to the attention of his primary care physician who sent him for mammogram and ultrasound.  The ultrasound showed only some right breast gynecomastia.  However the left breast showed a 3.9 cm suspicious solid mass in the retroareolar left breast tissue.  Ultrasound of the axilla showed for abnormal appearing axillary lymph nodes.  He underwent subsequent biopsy of the breast mass as well as one of the axillary lymph nodes.  The breast mass showed invasive ductal carcinoma grade 2,  ER/PR positive, HER2 negative, Ki-67 30%.  Biopsy of the axillary lymph node showed metastatic carcinoma with no residual lymph node tissue identified.  The patient is referred to [place] to discuss surgical options.  He has also been referred to oncology and radiation oncology.  His appointments are next week.  He is accompanied by his wife.  His wife also had breast cancer. Assessment and Plan: Diagnoses and all orders for this visit:   Invasive ductal carcinoma of breast, left (CMS/HHS-HCC)   I had a long discussion with the patient and his wife regarding his disease and the recommendations for treatment.  We have placed referrals to oncology and radiation oncology.  Those appointments will occur next week.  From a surgical standpoint, I think his surgical options are limited to a left mastectomy with a targeted axillary lymph node dissection.  Due to his excellent medical condition, it is possible that oncology may offer him neoadjuvant chemotherapy.  We went ahead and discussed the possible placement of a Port-A-Cath if indicated.  However, if they feel that he would not be a good candidate for chemotherapy, we will proceed with scheduling a left mastectomy with a left targeted axillary lymph node dissection after receiving cardiac clearance.  We will discuss this further with the patient after his oncology consultations next week.  Past/Anticipated interventions by medical oncology, if any: Pt to see Dr.Gudena on 12-23-22.  Lymphedema issues, if any:  none to report  Pain issues, if any:  none to report  SAFETY ISSUES: Prior radiation? no Pacemaker/ICD? no Possible  current pregnancy?no Is the patient on methotrexate? no  Current Complaints / other details: Pt had no major questions or concerns at this time. He did mention to RN that he may surgery first then radiation or the order may be reversed (per Surgeon).

## 2022-12-22 NOTE — Telephone Encounter (Signed)
Rn called pt in attempt to gather pre consult information. Family was with their minister at this time. They will call RN back.

## 2022-12-22 NOTE — Telephone Encounter (Signed)
Pt called RN back. Rn was able to obtain nurse evaluation information on pt with success. Note written and routed to Dr. Basilio Cairo and [facility].

## 2022-12-23 ENCOUNTER — Encounter: Payer: Self-pay | Admitting: Radiation Oncology

## 2022-12-23 ENCOUNTER — Inpatient Hospital Stay (HOSPITAL_BASED_OUTPATIENT_CLINIC_OR_DEPARTMENT_OTHER): Payer: Medicare Other | Admitting: Genetic Counselor

## 2022-12-23 ENCOUNTER — Inpatient Hospital Stay: Payer: Medicare Other

## 2022-12-23 ENCOUNTER — Other Ambulatory Visit: Payer: Self-pay | Admitting: Genetic Counselor

## 2022-12-23 ENCOUNTER — Encounter: Payer: Self-pay | Admitting: Genetic Counselor

## 2022-12-23 ENCOUNTER — Ambulatory Visit
Admission: RE | Admit: 2022-12-23 | Discharge: 2022-12-23 | Disposition: A | Discharge status: Home / Self Care | Payer: Medicare Other | Source: Ambulatory Visit | Attending: Radiation Oncology | Admitting: Radiation Oncology

## 2022-12-23 ENCOUNTER — Inpatient Hospital Stay: Payer: Medicare Other | Attending: Hematology and Oncology | Admitting: Hematology and Oncology

## 2022-12-23 ENCOUNTER — Encounter: Payer: Self-pay | Admitting: *Deleted

## 2022-12-23 ENCOUNTER — Other Ambulatory Visit: Payer: Self-pay

## 2022-12-23 VITALS — BP 140/66 | HR 64 | Temp 97.2°F | Resp 18 | Ht 71.0 in | Wt 160.6 lb

## 2022-12-23 VITALS — BP 147/81 | HR 81 | Temp 97.3°F | Ht 71.0 in | Wt 161.0 lb

## 2022-12-23 DIAGNOSIS — Z170 Estrogen receptor positive status [ER+]: Secondary | ICD-10-CM

## 2022-12-23 DIAGNOSIS — Z8719 Personal history of other diseases of the digestive system: Secondary | ICD-10-CM | POA: Insufficient documentation

## 2022-12-23 DIAGNOSIS — C50922 Malignant neoplasm of unspecified site of left male breast: Secondary | ICD-10-CM | POA: Diagnosis not present

## 2022-12-23 DIAGNOSIS — Z79899 Other long term (current) drug therapy: Secondary | ICD-10-CM | POA: Insufficient documentation

## 2022-12-23 DIAGNOSIS — N183 Chronic kidney disease, stage 3 unspecified: Secondary | ICD-10-CM | POA: Insufficient documentation

## 2022-12-23 DIAGNOSIS — K219 Gastro-esophageal reflux disease without esophagitis: Secondary | ICD-10-CM | POA: Diagnosis not present

## 2022-12-23 DIAGNOSIS — E785 Hyperlipidemia, unspecified: Secondary | ICD-10-CM | POA: Diagnosis not present

## 2022-12-23 DIAGNOSIS — N6453 Retraction of nipple: Secondary | ICD-10-CM | POA: Diagnosis not present

## 2022-12-23 DIAGNOSIS — R234 Changes in skin texture: Secondary | ICD-10-CM | POA: Insufficient documentation

## 2022-12-23 DIAGNOSIS — N6489 Other specified disorders of breast: Secondary | ICD-10-CM | POA: Diagnosis not present

## 2022-12-23 DIAGNOSIS — Z8042 Family history of malignant neoplasm of prostate: Secondary | ICD-10-CM

## 2022-12-23 DIAGNOSIS — M109 Gout, unspecified: Secondary | ICD-10-CM | POA: Diagnosis not present

## 2022-12-23 DIAGNOSIS — N62 Hypertrophy of breast: Secondary | ICD-10-CM | POA: Diagnosis not present

## 2022-12-23 DIAGNOSIS — Z87891 Personal history of nicotine dependence: Secondary | ICD-10-CM | POA: Diagnosis not present

## 2022-12-23 DIAGNOSIS — C50022 Malignant neoplasm of nipple and areola, left male breast: Secondary | ICD-10-CM | POA: Insufficient documentation

## 2022-12-23 DIAGNOSIS — I129 Hypertensive chronic kidney disease with stage 1 through stage 4 chronic kidney disease, or unspecified chronic kidney disease: Secondary | ICD-10-CM | POA: Diagnosis not present

## 2022-12-23 DIAGNOSIS — Z9049 Acquired absence of other specified parts of digestive tract: Secondary | ICD-10-CM | POA: Diagnosis not present

## 2022-12-23 DIAGNOSIS — Z7984 Long term (current) use of oral hypoglycemic drugs: Secondary | ICD-10-CM | POA: Insufficient documentation

## 2022-12-23 DIAGNOSIS — E1122 Type 2 diabetes mellitus with diabetic chronic kidney disease: Secondary | ICD-10-CM | POA: Diagnosis not present

## 2022-12-23 DIAGNOSIS — Z803 Family history of malignant neoplasm of breast: Secondary | ICD-10-CM | POA: Diagnosis not present

## 2022-12-23 DIAGNOSIS — Z86010 Personal history of colonic polyps: Secondary | ICD-10-CM | POA: Insufficient documentation

## 2022-12-23 LAB — GENETIC SCREENING ORDER

## 2022-12-23 NOTE — Progress Notes (Signed)
REFERRING PROVIDER: Serena Croissant, MD [address] [place],  [place] [address]  PRIMARY PROVIDER:  Georgianne Fick, MD  PRIMARY REASON FOR VISIT:  1. Malignant neoplasm of left breast in male, estrogen receptor positive, unspecified site of breast (HCC)   2. Family history of breast cancer   3. Family history of prostate cancer     HISTORY OF PRESENT ILLNESS:   Jesse Hoffman, a 87 y.o. male, was seen for a [facility] cancer genetics consultation at the request of Dr. Pamelia Hoit due to a personal and family history of breast cancer.  Jesse Hoffman presents to clinic today to discuss the possibility of a hereditary predisposition to cancer, to discuss genetic testing, and to further clarify his future cancer risks, as well as potential cancer risks for family members.   In June 2024, at the age of 89, Jesse Hoffman was diagnosed with invasive ductal carcinoma of the left male breast (ER+/PR+/HER2-). The preliminary treatment plan includes mastectomy, adjuvant radiation, and anti-estrogens.    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 Ultrasound: 3.9 cm irregular hypoechoic mass retroareolar left breast, 4 abnormal left axillary lymph nodes with cortical thickening, mild right gynecomastia. Biopsy: Grade 2 IDC, LVI present, lymph node positive, ER 100%, PR 50%, Ki-67 30%, HER2 2+, FISH negative ratio 1.08    12/23/2022 Cancer Staging   Staging form: Breast, AJCC 8th Edition - Clinical: Stage IIA (cT2, cN1, cM0, G2, ER+, PR+, HER2-) - Signed by Serena Croissant, MD on 12/23/2022 Stage prefix: Initial diagnosis Histologic grading system: 3 grade system       Past Medical History:  Diagnosis Date   Ascending cholangitis 2006, 01/2015   2016: in setting of choledocholithiasis.  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    HLD (hyperlipidemia)    Hypertension    Thrombocytopenia (HCC) 01/2015   in setting of sepsis, cholangitis.    Tubular adenoma of colon 09/2012   on colonoscopy diverticulosis and hemorrhoids noted as well    Past Surgical History:  Procedure Laterality Date   BREAST BIOPSY Left 12/09/2022   [place] LT BREAST BX W LOC DEV 1ST LESION IMG BX SPEC US GUIDE 12/09/2022 [facility]   ERCP N/A 02/03/2015   Procedure: ENDOSCOPIC RETROGRADE CHOLANGIOPANCREATOGRAPHY (ERCP);  Surgeon: Jeani Hawking, MD;  Location: [facility] ENDOSCOPY;  Service: Endoscopy;  Laterality: N/A;   ERCP N/A 05/14/2017   Procedure: ENDOSCOPIC RETROGRADE CHOLANGIOPANCREATOGRAPHY (ERCP);  Surgeon: Meryl Dare, MD;  Location: [facility] ENDOSCOPY;  Service: Endoscopy;  Laterality: N/A;   ERCP W/ SPHICTEROTOMY  06/2004    Ampulla in a diverticulum with multiple stones, found in the  common bile duct   fracture arm     age 69   LAPAROSCOPIC CHOLECYSTECTOMY  06/2004   dr Abbey Chatters   WISDOM TOOTH EXTRACTION      FAMILY HISTORY:  We obtained a detailed, 4-generation family history.  Significant diagnoses are listed below: Family History  Problem Relation Age of Onset   Breast cancer Sister  dx 50s-60s   Breast cancer Sister        dx 78s   Prostate cancer Brother        dx >50   Prostate cancer Brother        dx >50   Cancer Maternal Uncle        ? kidney cancer; dx 62s   Breast cancer Cousin        x4 maternal male cousins; dx >50?   Breast cancer Daughter 2     Jesse Hoffman reported that his daughter reportedly had negative genetic testing within the past couple years.  He also  thinks his sister, [name], who had breast cancer in her 38s, is currently undergoing genetic testing.    There is no reported Ashkenazi Jewish ancestry. There is no known consanguinity.  GENETIC COUNSELING ASSESSMENT: Jesse Hoffman is a 87 y.o. male with a personal and family history which is somewhat suggestive of a hereditary cancer syndrome and predisposition to cancer given his diagnosis of male breast cancer and the presence of related cancers in multiple generations of his family (breast cancer, prostate cancer). We, therefore, discussed and recommended the following at today's visit.   DISCUSSION: We discussed that 5 - 10% of cancer is hereditary.  Most cases of hereditary male breast cancer are associated with mutations in BRCA2.  There are other genes that can be associated with hereditary breast or prostate cancer syndromes.  We discussed that testing is beneficial for several reasons including knowing how to follow individuals for their cancer risks, identifying whether potential treatment options would be beneficial, and understanding if other family members could be at risk for cancer and allowing them to undergo genetic testing.   We reviewed the characteristics, features and inheritance patterns of hereditary cancer syndromes. We also discussed genetic testing, including the appropriate family members to test, the process of testing, insurance coverage and turn-around-time for results. We discussed the implications of a negative, positive, carrier and/or variant of uncertain significant result. We recommended Jesse Hoffman pursue genetic testing for a panel that includes genes associated with breast, prostate cancer, and other cancers.    The [facility] Common Hereditary Cancers + RNA Panel includes sequencing, deletion/duplication, and RNA analysis of the following 48 genes: APC, ATM, AXIN2, BAP1, BARD1, BMPR1A, BRCA1, BRCA2, BRIP1, CDH1, CDK4*, CDKN2A*, CHEK2, CTNNA1, DICER1, EPCAM* (del/dup only),  FH, GREM1* (promoter dup analysis only), HOXB13*, KIT*, MBD4*, MEN1, MLH1, MSH2, MSH3, MSH6, MUTYH, NF1, NTHL1, PALB2, PDGFRA*, PMS2, POLD1, POLE, PTEN, RAD51C, RAD51D, SDHA (sequencing only), SDHB, SDHC, SDHD, SMAD4, SMARCA4, STK11, TP53, TSC1, TSC2, VHL.  *Genes without RNA analysis.   Based on Jesse Hoffman's personal history of male breast cancer, he meets NCCN medical criteria for genetic testing. Despite that he meets criteria, he may still have an out of pocket cost. We discussed that if his out of pocket cost for testing is over $100, the laboratory should contact him and discuss the self-pay prices and/or patient pay assistance programs.    PLAN: After considering the risks, benefits, and limitations, Jesse Hoffman provided informed consent to pursue genetic testing and the blood sample was sent to [facility] for analysis of the Common Hereditary Cancers +RNA Panel. Results should be available within approximately 3 weeks' time, at which point they will be disclosed by telephone to Jesse Hoffman, as will any additional recommendations warranted by these results. Jesse Hoffman will receive a summary of his genetic counseling visit and a copy of his results once available. This  information will also be available in Epic.    Jesse Hoffman questions were answered to his satisfaction today. Our contact information was provided should additional questions or concerns arise. Thank you for the referral and allowing [place] to share in the care of your patient.   Jesse Charbonnet M. Rennie Plowman, MS, [facility] Genetic Counselor [email or web address] ([phone]) [phone]  The patient was seen for a total of 30 minutes in face-to-face genetic counseling.  The was accompanied by his wife, Jesse Hoffman.  Drs. Pamelia Hoit and/or Mosetta Putt were available to discuss this case as needed.    _______________________________________________________________________ For Office Staff:  Number of people involved in session: 2 Was an Intern/ student involved  with case: no

## 2022-12-23 NOTE — Assessment & Plan Note (Addendum)
12/09/2022: mammogram detected asymmetry/mass retroareolar left breast with left nipple retraction and skin thickening.  Ultrasound: 3.9 cm irregular hypoechoic mass retroareolar left breast, 4 abnormal left axillary lymph nodes with cortical thickening, mild right gynecomastia.  Biopsy: Grade 2 IDC, LVI present, lymph node positive, ER 100%, PR 50%, Ki-67 30%, HER2 2+, FISH negative ratio 1.08  Pathology and radiology counseling: Discussed with the patient, the details of pathology including the type of breast cancer,the clinical staging, the significance of ER, PR and HER-2/neu receptors and the implications for treatment. After reviewing the pathology in detail, we proceeded to discuss the different treatment options between surgery, radiation, chemotherapy, antiestrogen therapies.  Treatment plan: Mastectomy with targeted node dissection Given his age, I did not recommend systemic chemotherapy but instead recommended tamoxifen Adjuvant radiation CT CAP and bone scan for staging  Return to clinic after surgery to discuss the final pathology report.

## 2022-12-23 NOTE — Progress Notes (Signed)
[facility] CONSULT NOTE  Patient Care Team: Georgianne Fick, MD as PCP - General (Internal Medicine)  CHIEF COMPLAINTS/PURPOSE OF CONSULTATION:  Newly diagnosed breast cancer  HISTORY OF PRESENTING ILLNESS:  Jesse Hoffman 87 y.o. male is here because of recent diagnosis of left breast cancer.  He felt a lump in the left breast for about 3 to 4 months and finally brought to the attention of the doctors in he was noted to have a 3.9 cm mass in the retroareolar left breast with 4 abnormal left axillary lymph nodes 1 of which was biopsied positive to be breast cancer.  The biopsy of the breast came back as grade 2 invasive ductal carcinoma that was ER/PR positive HER2 negative.  She was presented in the tumor board and he has seen Dr. Corliss Skains  I reviewed her records extensively and collaborated the history with the patient.  SUMMARY OF ONCOLOGIC HISTORY: Oncology History  Malignant neoplasm of left breast in male, estrogen receptor positive (HCC)  12/19/2022 Initial Diagnosis   Mammogram detected asymmetry/mass retroareolar left breast with left nipple retraction and skin thickening. Ultrasound: 3.9 cm irregular hypoechoic mass retroareolar left breast, 4 abnormal left axillary lymph nodes with cortical thickening, mild right gynecomastia. Biopsy: Grade 2 IDC, LVI present, lymph node positive, ER 100%, PR 50%, Ki-67 30%, HER2 2+, FISH negative ratio 1.08    12/23/2022 Cancer Staging   Staging form: Breast, AJCC 8th Edition - Clinical: Stage IIA (cT2, cN1, cM0, G2, ER+, PR+, HER2-) - Signed by Serena Croissant, MD on 12/23/2022 Stage prefix: Initial diagnosis Histologic grading system: 3 grade system      MEDICAL HISTORY:  Past Medical History:  Diagnosis Date   Ascending cholangitis 2006, 01/2015   2016: in setting of choledocholithiasis.  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    HLD (hyperlipidemia)    Hypertension    Thrombocytopenia (HCC) 01/2015   in setting of sepsis, cholangitis.    Tubular adenoma of colon 09/2012   on colonoscopy diverticulosis and hemorrhoids noted as well    SURGICAL HISTORY: Past Surgical History:  Procedure Laterality Date   BREAST BIOPSY Left 12/09/2022   [place] LT BREAST BX W LOC DEV 1ST LESION IMG BX SPEC US GUIDE 12/09/2022 [facility]MO[facility]   ERCP N/A 02/03/2015   Procedure: ENDOSCOPIC RETROGRADE CHOLANGIOPANCREATOGRAPHY (ERCP);  Surgeon: Jeani Hawking, MD;  Location: [facility] ENDOSCOPY;  Service: Endoscopy;  Laterality: N/A;   ERCP N/A 05/14/2017   Procedure: ENDOSCOPIC RETROGRADE CHOLANGIOPANCREATOGRAPHY (ERCP);  Surgeon: Meryl Dare, MD;  Location: [facility] ENDOSCOPY;  Service: Endoscopy;  Laterality: N/A;   ERCP W/ SPHICTEROTOMY  06/2004    Ampulla in a diverticulum with multiple stones, found in the  common bile duct   fracture arm     age 45   LAPAROSCOPIC CHOLECYSTECTOMY  06/2004   dr Abbey Chatters   WISDOM TOOTH EXTRACTION      SOCIAL HISTORY: Social History   Socioeconomic History   Marital status: Married    Spouse name: Not on file   Number of children: 2   Years of education: Not on file   Highest education level: Not on file  Occupational History  Occupation: retired  Tobacco Use   Smoking status: Former    Types: Cigarettes    Quit date: 09/21/1963    Years since quitting: 59.2   Smokeless tobacco: Never  Vaping Use   Vaping Use: Never used  Substance and Sexual Activity   Alcohol use: Not Currently   Drug use: No   Sexual activity: Not on file  Other Topics Concern   Not on file  Social History Narrative   Not on file   Social Determinants of Health   Financial Resource Strain: Not on file  Food  Insecurity: No Food Insecurity (12/22/2022)   Hunger Vital Sign    Worried About Running Out of Food in the Last Year: Never true    Ran Out of Food in the Last Year: Never true  Transportation Needs: No Transportation Needs (12/22/2022)   PRAPARE - Administrator, Civil Service (Medical): No    Lack of Transportation (Non-Medical): No  Physical Activity: Not on file  Stress: Not on file  Social Connections: Not on file  Intimate Partner Violence: Not At Risk (12/22/2022)   Humiliation, Afraid, Rape, and Kick questionnaire    Fear of Current or Ex-Partner: No    Emotionally Abused: No    Physically Abused: No    Sexually Abused: No    FAMILY HISTORY: Family History  Problem Relation Age of Onset   Heart disease Mother    Emphysema Father    Heart attack Father    Multiple sclerosis Sister    Breast cancer Sister        54's   Breast cancer Sister        83's   Colon cancer Neg Hx     ALLERGIES:  is allergic to allopurinol.  MEDICATIONS:  Current Outpatient Medications  Medication Sig Dispense Refill   Alpha Lipoic Acid 200 MG CAPS Take 200 mg by mouth daily in the afternoon.     amLODipine (NORVASC) 5 MG tablet Take 1 tablet by mouth daily.     atorvastatin (LIPITOR) 40 MG tablet Take 1 tablet (40 mg total) by mouth daily at 6 PM. Restart after 1 week, when Liver function tests normalize     B Complex CAPS Take 1 capsule by mouth every other day.     cetirizine (ZYRTEC ALLERGY) 10 MG tablet Take by mouth daily as needed.     Colchicine 0.6 MG CAPS Take 1 tablet by mouth daily.     FARXIGA 10 MG TABS tablet Take 10 mg by mouth daily.     hydrOXYzine (ATARAX/VISTARIL) 25 MG tablet Take 1 tablet (25 mg total) every 6 (six) hours as needed by mouth for itching. 30 tablet 0   KERENDIA 10 MG TABS Take 1 tablet by mouth daily.     Lancets (ONETOUCH DELICA PLUS LANCET33G) MISC Apply topically 2 (two) times daily.     LORazepam (ATIVAN) 1 MG tablet Take 1 tablet (1 mg  total) every 6 (six) hours as needed by mouth for sleep. 15 tablet 0   losartan (COZAAR) 50 MG tablet Take 1 tablet by mouth 2 (two) times daily.     metFORMIN (GLUCOPHAGE-XR) 500 MG 24 hr tablet Take 1 tablet (500 mg total) by mouth every evening. Restart om 05/20/17 30 tablet 1   Multiple Vitamin (MULTIVITAMIN WITH MINERALS) TABS tablet Take 1 tablet daily by mouth.     omeprazole (PRILOSEC) 20 MG capsule Take 1 capsule (20 mg total) by mouth daily. 30 capsule  3   ONETOUCH VERIO test strip in the morning and at bedtime.     repaglinide (PRANDIN) 1 MG tablet Take 1 mg by mouth daily in the afternoon.     triamterene-hydrochlorothiazide (MAXZIDE) 75-50 MG tablet Take 0.5 tablets by mouth every morning. 15 tablet 0   No current facility-administered medications for this visit.    REVIEW OF SYSTEMS:   Constitutional: Denies fevers, chills or abnormal night sweats Breast: Palpable nodule in the left breast All other systems were reviewed with the patient and are negative.  PHYSICAL EXAMINATION: ECOG PERFORMANCE STATUS: 1 - Symptomatic but completely ambulatory  Vitals:   12/23/22 1210  BP: (!) 140/66  Pulse: 64  Resp: 18  Temp: (!) 97.2 F (36.2 C)  SpO2: 98%   Filed Weights   12/23/22 1210  Weight: 160 lb 9.6 oz (72.8 kg)    GENERAL:alert, no distress and comfortable BREAST: Palpable nodule in the left breast with skin changes from the nipple  LABORATORY DATA:  I have reviewed the data as listed Lab Results  Component Value Date   WBC 4.8 10/26/2020   HGB 11.9 (L) 10/26/2020   HCT 32.9 (L) 10/26/2020   MCV 87.3 10/26/2020   PLT 205 10/26/2020   Lab Results  Component Value Date   NA 129 (L) 10/26/2020   K 3.7 10/26/2020   CL 94 (L) 10/26/2020   CO2 23 10/26/2020    RADIOGRAPHIC STUDIES: I have personally reviewed the radiological reports and agreed with the findings in the report.  ASSESSMENT AND PLAN:  Malignant neoplasm of left breast in male, estrogen  receptor positive (HCC) 12/09/2022: mammogram detected asymmetry/mass retroareolar left breast with left nipple retraction and skin thickening.  Ultrasound: 3.9 cm irregular hypoechoic mass retroareolar left breast, 4 abnormal left axillary lymph nodes with cortical thickening, mild right gynecomastia.  Biopsy: Grade 2 IDC, LVI present, lymph node positive, ER 100%, PR 50%, Ki-67 30%, HER2 2+, FISH negative ratio 1.08  Pathology and radiology counseling: Discussed with the patient, the details of pathology including the type of breast cancer,the clinical staging, the significance of ER, PR and HER-2/neu receptors and the implications for treatment. After reviewing the pathology in detail, we proceeded to discuss the different treatment options between surgery, radiation, chemotherapy, antiestrogen therapies.  Treatment plan: Mastectomy with targeted node dissection Given his age, I did not recommend systemic chemotherapy but instead recommended tamoxifen Adjuvant radiation CT CAP  for staging  Return to clinic after surgery to discuss the final pathology report.  All questions were answered. The patient knows to call the clinic with any problems, questions or concerns.    Tamsen Meek, MD 12/23/22

## 2022-12-24 ENCOUNTER — Ambulatory Visit: Payer: Medicare Other | Attending: Cardiovascular Disease | Admitting: Student

## 2022-12-24 ENCOUNTER — Other Ambulatory Visit: Payer: Self-pay | Admitting: Surgery

## 2022-12-24 DIAGNOSIS — Z01810 Encounter for preprocedural cardiovascular examination: Secondary | ICD-10-CM | POA: Diagnosis not present

## 2022-12-24 DIAGNOSIS — C50022 Malignant neoplasm of nipple and areola, left male breast: Secondary | ICD-10-CM

## 2022-12-24 NOTE — Progress Notes (Signed)
Virtual Visit via Telephone Note   Because of Jesse Hoffman's co-morbid illnesses, he is at least at moderate risk for complications without adequate follow up.  This format is felt to be most appropriate for this patient at this time.  The patient did not have access to video technology/had technical difficulties with video requiring transitioning to audio format only (telephone).  All issues noted in this document were discussed and addressed.  No physical exam could be performed with this format.  Please refer to the patient's chart for his consent to telehealth for [facility].  Evaluation Performed:  Preoperative cardiovascular risk assessment _____________   Date:  12/24/2022   Patient ID:  Jesse Hoffman, DOB 25-May-1936, MRN [id number] Patient Location:  Home Provider location:   Office  Primary Care Provider:  Georgianne Fick, MD Primary Cardiologist:  Jesse Fair, MD  Chief Complaint / Patient Profile   87 y.o. 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 Corliss Hoffman and presents today for telephonic preoperative cardiovascular risk assessment.  History of Present Illness    Jesse Hoffman is a 87 y.o. male who presents via audio/video conferencing for a telehealth visit today.  Pt was last seen in cardiology clinic on 05/01/2022 by Dr. Royann Hoffman.  At that time Jesse Hoffman was doing well.  The patient is now pending procedure as outlined above. Since his last visit, he is doing well. Patient denies shortness of breath or dyspnea on exertion. No chest pain, pressure, or tightness. Denies lower extremity edema, orthopnea, or PND. No palpitations. He stays active performing yard work and other household activities. He was  going to gym 1-2 times a week to use the bicycle. He has been out of his routine for about a month with everything that has been going on.   Past Medical History    Past Medical History:  Diagnosis Date   Ascending cholangitis 2006, 01/2015   2016: in setting of choledocholithiasis.  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    HLD (hyperlipidemia)    Hypertension    Thrombocytopenia (HCC) 01/2015   in setting of sepsis, cholangitis.    Tubular adenoma of colon 09/2012   on colonoscopy diverticulosis and hemorrhoids noted as well   Past Surgical History:  Procedure Laterality Date   BREAST BIOPSY Left 12/09/2022   [place] LT BREAST BX W LOC DEV 1ST LESION IMG BX SPEC US GUIDE 12/09/2022 [facility] MAMMOGRAPHY   ERCP N/A 02/03/2015   Procedure: ENDOSCOPIC RETROGRADE CHOLANGIOPANCREATOGRAPHY (ERCP);  Surgeon: Jeani Hawking, MD;  Location: [facility] ENDOSCOPY;  Service: Endoscopy;  Laterality: N/A;   ERCP N/A 05/14/2017   Procedure: ENDOSCOPIC RETROGRADE CHOLANGIOPANCREATOGRAPHY (ERCP);  Surgeon: Meryl Dare, MD;  Location: [facility] ENDOSCOPY;  Service: Endoscopy;  Laterality: N/A;   ERCP W/ SPHICTEROTOMY  06/2004    Ampulla in a diverticulum with multiple stones, found in the  common bile duct   fracture arm     age 40  LAPAROSCOPIC CHOLECYSTECTOMY  06/2004   dr Abbey Chatters   WISDOM TOOTH EXTRACTION      Allergies  Allergies  Allergen Reactions   Allopurinol     hepatotoxicity    Home Medications    Prior to Admission medications   Medication Sig Start Date End Date Taking? Authorizing Provider  Alpha Lipoic Acid 200 MG CAPS Take 200 mg by mouth daily in the afternoon.    [provider]   amLODipine (NORVASC) 5 MG tablet Take 1 tablet by mouth daily.    [provider]  atorvastatin (LIPITOR) 40 MG tablet Take 1 tablet (40 mg total) by mouth daily at 6 PM. Restart after 1 week, when Liver function tests normalize 02/06/15   Zannie Cove, MD  B Complex CAPS Take 1 capsule by mouth every other day.    [provider]  cetirizine (ZYRTEC ALLERGY) 10 MG tablet Take by mouth daily as needed. 10/04/20   [provider]  Colchicine 0.6 MG CAPS Take 1 tablet by mouth daily.    [provider]  FARXIGA 10 MG TABS tablet Take 10 mg by mouth daily.    [provider]  hydrOXYzine (ATARAX/VISTARIL) 25 MG tablet Take 1 tablet (25 mg total) every 6 (six) hours as needed by mouth for itching. 05/08/17   Long, Arlyss Repress, MD  KERENDIA 10 MG TABS Take 1 tablet by mouth daily. 11/26/22   [provider]  Lancets (ONETOUCH DELICA PLUS [place]) MISC Apply topically 2 (two) times daily. 03/04/22   [provider]  LORazepam (ATIVAN) 1 MG tablet Take 1 tablet (1 mg total) every 6 (six) hours as needed by mouth for sleep. 05/08/17   Long, Arlyss Repress, MD  losartan (COZAAR) 50 MG tablet Take 1 tablet by mouth 2 (two) times daily.    [provider]  metFORMIN (GLUCOPHAGE-XR) 500 MG 24 hr tablet Take 1 tablet (500 mg total) by mouth every evening. Restart om 05/20/17 05/17/17   Shon Hale, MD  Multiple Vitamin (MULTIVITAMIN WITH MINERALS) TABS tablet Take 1 tablet daily by mouth.    [provider]  omeprazole (PRILOSEC) 20 MG capsule Take 1 capsule (20 mg total) by mouth daily. 05/17/17   Shon Hale, MD  [facility] VERIO test strip in the morning and at bedtime. 02/13/22   [provider]  repaglinide (PRANDIN) 1 MG tablet Take 1 mg by mouth daily in the afternoon.    [provider]  triamterene-hydrochlorothiazide (MAXZIDE) 75-50 MG tablet Take 0.5 tablets by mouth every morning. 05/17/17   Shon Hale, MD    Physical Exam    Vital Signs:  Jesse Hoffman does not have vital signs available for review today.  Given telephonic nature of communication, physical exam is limited. AAOx3. NAD. Normal affect.  Speech and respirations are unlabored.  Accessory Clinical Findings    None  Assessment & Plan    Primary Cardiologist: Jesse Fair, MD  Preoperative cardiovascular risk assessment. 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  Chart reviewed as part of pre-operative protocol coverage. According to the RCRI, patient has a 0.4% risk of MACE. Patient reports activity equivalent to >4.0 METS (yard work, gym 1-2 times a week using bicycle).   Given past medical history and time since last visit, based on ACC/AHA guidelines, Jesse Hoffman would be at acceptable risk for the planned procedure without further cardiovascular testing.   Patient was advised  that if he develops new symptoms prior to surgery to contact our office to arrange a follow-up appointment.  he verbalized understanding.   I will route this recommendation to the requesting party via Epic fax function.  Please call with questions.  Time:   Today, I have spent 5 minutes with the patient with telehealth technology discussing medical history, symptoms, and management plan.     Carlos Levering, NP  12/24/2022, 2:43 PM

## 2022-12-29 ENCOUNTER — Encounter: Payer: Self-pay | Admitting: *Deleted

## 2022-12-29 DIAGNOSIS — E1121 Type 2 diabetes mellitus with diabetic nephropathy: Secondary | ICD-10-CM | POA: Diagnosis not present

## 2022-12-29 DIAGNOSIS — N1831 Chronic kidney disease, stage 3a: Secondary | ICD-10-CM | POA: Diagnosis not present

## 2022-12-29 DIAGNOSIS — Z170 Estrogen receptor positive status [ER+]: Secondary | ICD-10-CM

## 2022-12-30 ENCOUNTER — Telehealth: Payer: Self-pay | Admitting: Genetic Counselor

## 2022-12-30 ENCOUNTER — Encounter: Payer: Self-pay | Admitting: Genetic Counselor

## 2022-12-30 ENCOUNTER — Telehealth: Payer: Self-pay | Admitting: Radiation Oncology

## 2022-12-30 ENCOUNTER — Ambulatory Visit: Payer: Self-pay | Admitting: Genetic Counselor

## 2022-12-30 DIAGNOSIS — Z1379 Encounter for other screening for genetic and chromosomal anomalies: Secondary | ICD-10-CM

## 2022-12-30 DIAGNOSIS — Z8042 Family history of malignant neoplasm of prostate: Secondary | ICD-10-CM

## 2022-12-30 DIAGNOSIS — Z170 Estrogen receptor positive status [ER+]: Secondary | ICD-10-CM

## 2022-12-30 DIAGNOSIS — Z803 Family history of malignant neoplasm of breast: Secondary | ICD-10-CM

## 2022-12-30 NOTE — Telephone Encounter (Signed)
Disclosed negative genetics.    

## 2022-12-30 NOTE — Telephone Encounter (Signed)
7/9 @ 9:13 am Left voicemail for patient to call our office to be schedule for follow up consult with Dr. Basilio Cairo.

## 2022-12-31 ENCOUNTER — Telehealth: Payer: Self-pay | Admitting: Radiation Oncology

## 2022-12-31 NOTE — Telephone Encounter (Signed)
7/10 @ 9:11 am Left voicemail on both patient's contact numbers for patient to call our office to be schedule for consult.

## 2022-12-31 NOTE — Progress Notes (Signed)
HPI:   Mr. Jesse Hoffman was previously seen in the [facility] due to a personal and family history of breast cnacer and concerns regarding a hereditary predisposition to cancer.    Mr. Jesse Hoffman recent genetic test results were disclosed to him by telephone. These results and recommendations are discussed in more detail below.  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 Ultrasound: 3.9 cm irregular hypoechoic mass retroareolar left breast, 4 abnormal left axillary lymph nodes with cortical thickening, mild right gynecomastia. Biopsy: Grade 2 IDC, LVI present, lymph node positive, ER 100%, PR 50%, Ki-67 30%, HER2 2+, FISH negative ratio 1.08    12/23/2022 Cancer Staging   Staging form: Breast, AJCC 8th Edition - Clinical: Stage IIA (cT2, cN1, cM0, G2, ER+, PR+, HER2-) - Signed by Serena Croissant, MD on 12/23/2022 Stage prefix: Initial diagnosis Histologic grading system: 3 grade system   12/30/2022 Genetic Testing   Negative [facility] Common Hereditary Cancers +RNA Panel.  Report date is 12/30/2022.    The [facility] Common Hereditary Cancers + RNA Panel includes sequencing, deletion/duplication, and RNA analysis of the following 48 genes: APC, ATM, AXIN2, BAP1, BARD1, BMPR1A, BRCA1, BRCA2, BRIP1, CDH1, CDK4*, CDKN2A*, CHEK2, CTNNA1, DICER1, EPCAM* (del/dup only), FH, GREM1* (promoter dup analysis only), HOXB13*, KIT*, MBD4*, MEN1, MLH1, MSH2, MSH3, MSH6, MUTYH, NF1, NTHL1, PALB2, PDGFRA*, PMS2, POLD1, POLE, PTEN, RAD51C, RAD51D, SDHA (sequencing only), SDHB, SDHC, SDHD, SMAD4, SMARCA4, STK11, TP53, TSC1, TSC2, VHL.  *Genes without RNA analysis.      FAMILY HISTORY:  We obtained a detailed, 4-generation family history.  Significant diagnoses are listed below:      Family History   Problem Relation Age of Onset   Breast cancer Sister          dx 80s-60s   Breast cancer Sister          dx 40s   Prostate cancer Brother          dx >50   Prostate cancer Brother          dx >50   Cancer Maternal Uncle          ? kidney cancer; dx 103s   Breast cancer Cousin          x4 maternal male cousins; dx >50?   Breast cancer Daughter 77       Jesse Hoffman reported that his daughter reportedly had negative genetic testing within the past couple years.  He also thinks his sister, Jesse Hoffman, who had breast cancer in her 4s, is currently undergoing genetic testing.    There is no reported Ashkenazi Jewish ancestry. There is no known consanguinity.  GENETIC TEST RESULTS:  The [facility] Common Hereditary Cancers +RNA Panel found no pathogenic mutations.    The [facility] Common Hereditary Cancers + RNA Panel includes sequencing, deletion/duplication, and RNA analysis of the following 48 genes: APC, ATM, AXIN2, BAP1, BARD1, BMPR1A, BRCA1, BRCA2, BRIP1, CDH1, CDK4*, CDKN2A*, CHEK2, CTNNA1, DICER1, EPCAM* (del/dup only), FH, GREM1* (promoter dup analysis only), HOXB13*, KIT*, MBD4*, MEN1, MLH1, MSH2, MSH3, MSH6, MUTYH, NF1, NTHL1, PALB2, PDGFRA*, PMS2, POLD1, POLE, PTEN, RAD51C, RAD51D, SDHA (sequencing only), SDHB, SDHC, SDHD, SMAD4, SMARCA4, STK11, TP53, TSC1, TSC2, VHL.  *Genes without RNA analysis.   The test report has been scanned into EPIC and is located under the Molecular Pathology section of  the Results Review tab.  A portion of the result report is included below for reference. Genetic testing reported out on {date}.   ***   ***Genetic testing identified a variant of uncertain significance (VUS) in the *** gene called ***.  At this time, it is unknown if this variant is associated with an increased risk for cancer or if it is benign, but most uncertain variants are reclassified to benign. It should not be used to make medical management decisions. With time, we suspect the  laboratory will determine the significance of this variant, if any. If the laboratory reclassifies this variant, we will attempt to contact [name] to discuss it further.   Even though a pathogenic variant was not identified, possible explanations for the cancer in the family may include: There may be no hereditary risk for cancer in the family. The cancers in Jesse Hoffman ***and/or his family may be sporadic/familial or due to other genetic and environmental factors. There may be a gene mutation in one of these genes that current testing methods cannot detect but that chance is small. There could be another gene that has not yet been discovered, or that we have not yet tested, that is responsible for the cancer diagnoses in the family.  It is also possible there is a hereditary cause for the cancer in the family that Jesse Hoffman did not inherit. The variant of uncertain significance detected in the *** gene may be reclassified as a pathogenic variant in the future. At this time, we do not know if this variant increases the risk for cancer.***  Therefore, it is important to remain in touch with cancer genetics in the future so that we can continue to offer Jesse Hoffman the most up to date genetic testing.   ***[For true negative] We recommended Jesse Hoffman pursue testing for the familial hereditary cancer gene mutation called ***, ***. Jesse Hoffman test was normal and did not reveal the familial mutation. We call this result a true negative result because the cancer-causing mutation was identified in Mr. Milroy's family, and he did not inherit it.  Given this negative result, Jesse Hoffman chances of developing ***-related cancers are the same as they are in the general population.    ADDITIONAL GENETIC TESTING:   Jesse Hoffman genetic testing was fairly extensive.  If there are additional relevant genes identified to increase cancer risk that can be analyzed in the future, we would be happy to discuss and  coordinate this testing at that time.     Jesse Hoffman genetic testing  that there are other genes that are associated with increased cancer risk that can be analyzed. Should Mr. Anspach wish to pursue additional genetic testing, we are happy to discuss and coordinate this testing, at any time.***  CANCER SCREENING RECOMMENDATIONS:  Mr. Jewel test result is considered negative (normal).  This means that we have not identified a hereditary cause for his {Personal/family:20331} history of {cancer/polyps} at this time.   ***[For affected/unaffected do not believe] Given Mr. Duffner's {Personal/family:20331} histories, we must interpret these negative results with some caution.  Families with features suggestive of hereditary risk for cancer tend to have multiple family members with cancer, diagnoses in multiple generations and diagnoses before the age of 13. Mr. Stutsman family exhibits some of these features. Thus, this result may simply reflect our current inability to detect all mutations within these genes or there may be a different gene that has not yet been discovered or tested.  An individual's cancer risk and medical management are not determined by genetic test results alone. Overall cancer risk assessment incorporates additional factors, including personal medical history, family history, and any available genetic information that may result in a personalized plan for cancer prevention and surveillance. Therefore, it is recommended he continue to follow the cancer management and screening guidelines provided by his ***oncology and primary healthcare provider.  ***Based on the reported personal and family history, specific cancer screenings for Mr. Maxwell Caul Siedlecki and his family include:  ***  ***[HIGH RISK BREAST] Mr. Schrom has been determined to be at high risk for breast cancer.  HisTyrer-Cuzick risk score is ***%.  This risk estimate can change over time and may be repeated to reflect  new information in his personal or family history in the future.  For women with a greater than 20% lifetime risk of breast cancer, the [employer] (NCCN) recommends the following:  Clinical encounter every 6-12 months to begin when identified as being at increased risk, but not before age 25  Annual mammograms. Tomosynthesis is recommended starting 10 years earlier than the youngest breast cancer diagnosis in the family or at age 59 (whichever comes first), but not before age 18   Annual breast MRI starting 10 years earlier than the youngest breast cancer diagnosis in the family or at age 27 (whichever comes first), but not before age 36    ***[Breast High Risk FU]  We, therefore, discussed that it is reasonable for Mr. Stayer to be followed by a high-risk breast cancer clinic; in addition to a yearly mammogram and physical exam by a healthcare provider, he should discuss the usefulness of an annual breast MRI with *** her referring provider and/or the high-risk clinic providers.  ***Mr. Cassin is interested in a referral to the high risk breast clinic, and a referral has been placed.  ***Mr. Faria is not interested in a referral to the high risk breast clinic at this time.  She knows to reach out in the future if she is interested.   ***[GI High Risk FU] This negative genetic test simply tells [place] that we cannot yet define why Mr. Laday has had *** an increased number of colorectal polyps. *** colorectal cancer at a young age. Mr. Moan medical management and screening should be based on the prospect that he *** will likely form more colon polyps  *** may be at an increased risk for a second colorectal cancer in the future and should, therefore, undergo more frequent colonoscopy screening at intervals determined by his GI providers.  We also recommended that Mr. Dowd have an upper endoscopy periodically.  RECOMMENDATIONS FOR FAMILY MEMBERS:   Since he did not inherit a  identifiable mutation in a cancer predisposition gene included on this panel, his children could not have inherited a known mutation from him in one of these genes. Individuals in this family might be at some increased risk of developing cancer, over the general population risk, due to the family history of cancer.  Individuals in the family should notify their providers of the family history of cancer. We recommend women in this family have a yearly mammogram beginning at age 36, or 82 years younger than the earliest onset of cancer, an annual clinical breast exam, and perform monthly breast self-exams.  Risk models that take into account family history and hormonal history may be helpful in determining appropriate breast cancer screening options for family members. *** Family members should have colonoscopies  by at age 21, or earlier, as recommended by their providers.*** Male relatives should speak with their providers about prostate cancer screening.  Other members of the family may still carry a pathogenic variant in one of these genes that Mr. Cammarano did not inherit. Based on the family history, we recommend his ***, who was diagnosed with *** at age ***, have genetic counseling and testing. Mr. Soward can let us know if we can be of any assistance in coordinating genetic counseling and/or testing for these family member.   ***We do not recommend familial testing for the *** variant of uncertain significance (VUS).  FOLLOW-UP:  Cancer genetics is a rapidly advancing field and it is possible that new genetic tests will be appropriate for him and/or his family members in the future. We encourage Mr. Bussey to remain in contact with cancer genetics, so we can update his personal and family histories and let him know of advances in cancer genetics that may benefit this family.   Our contact number was provided.. he knows he is welcome to call us at anytime with additional questions or concerns.   Kalyn Dimattia M.  Rennie Plowman, MS, [facility] Genetic Counselor [email or web address] (P) [phone]

## 2023-01-01 ENCOUNTER — Telehealth: Payer: Self-pay | Admitting: Licensed Clinical Social Worker

## 2023-01-01 ENCOUNTER — Inpatient Hospital Stay: Payer: Medicare Other | Admitting: Licensed Clinical Social Worker

## 2023-01-01 NOTE — Progress Notes (Signed)
[facility] Clinical Social Work  Clinical Social Work was referred by new patient protocol for assessment of psychosocial needs.  Clinical Social Worker contacted patient by phone to offer support and assess for needs.   CSW spoke with pt and introduced support services. Pt reports doing fairly well overall and having good support. He denied any SDOH needs.  CSW encouraged pt to call back if there are any changes and he would like any coping or resource support.     Jesse Hoffman E Beuna Bolding, LCSW  Clinical Social Worker [facility]

## 2023-01-01 NOTE — Telephone Encounter (Signed)
[facility] Clinical Social Work  Clinical Social Work was referred by new patient protocol for assessment of psychosocial needs.  Clinical Social Worker attempted to contact patient by phone  to offer support and assess for needs.   No answer. Left VM with direct contact information and brief description of support services.     Oliva Montecalvo E Sherrill Mckamie, LCSW  Clinical Social Worker [facility]        

## 2023-01-05 DIAGNOSIS — E1121 Type 2 diabetes mellitus with diabetic nephropathy: Secondary | ICD-10-CM | POA: Diagnosis not present

## 2023-01-05 DIAGNOSIS — N1831 Chronic kidney disease, stage 3a: Secondary | ICD-10-CM | POA: Diagnosis not present

## 2023-01-05 NOTE — Progress Notes (Addendum)
Surgical Instructions    Your procedure is scheduled on Wednesday, January 14, 2023.  Report to [facility] Main Entrance "A" at 9:00 A.M., then check in with the Admitting office.  Call this number if you have problems the morning of surgery:  [phone]   If you have any questions prior to your surgery date call [phone]: Open Monday-Friday 8am-4pm If you experience any cold or flu symptoms such as cough, fever, chills, shortness of breath, etc. between now and your scheduled surgery, please notify us at the above number     Remember:  Do not eat after midnight the night before your surgery  You may drink clear liquids until 8:00 the morning of your surgery.   Clear liquids allowed are: Water, Non-Citrus Juices (without pulp), Carbonated Beverages, Clear Tea, Black Coffee ONLY (NO MILK, CREAM OR POWDERED CREAMER of any kind), and Gatorade     Patient Instructions  The night before surgery:  No food after midnight. ONLY clear liquids after midnight  The day of surgery (if you have diabetes): Drink ONE (1) 12 oz G2 given to you in your pre admission testing appointment by 8:00 the morning of surgery. Drink in one sitting. Do not sip.  This drink was given to you during your hospital  pre-op appointment visit.  Nothing else to drink after completing the  12 oz bottle of G2.         If you have questions, please contact your surgeon's office.      Take these medicines the morning of surgery with A SIP OF WATER:  amLODipine (NORVASC)  KERENDIA  omeprazole (PRILOSEC)    As Needed hydrOXYzine (ATARAX/VISTARIL)    As of today, STOP taking any Aspirin (unless otherwise instructed by your surgeon) Aleve, Naproxen, Ibuprofen, Motrin, Advil, Goody's, BC's, all herbal medications, fish oil, and all vitamins.    WHAT DO I DO ABOUT MY DIABETES MEDICATION?   Do not take oral diabetes medicines (pills) the morning of surgery. DO NOT TAKE YOUR metFORMIN (GLUCOPHAGE-XR) the  MORNING of surgery        STOP TAKING YOUR  FARXIGA 72 hours prior to surgery, with the last dose being 01/10/2023   The day of surgery, do not take other diabetes injectables, including Byetta (exenatide), Bydureon (exenatide ER), Victoza (liraglutide), or Trulicity (dulaglutide).  If your CBG is greater than 220 mg/dL, you may take  of your sliding scale (correction) dose of insulin.   HOW TO MANAGE YOUR DIABETES BEFORE AND AFTER SURGERY  Why is it important to control my blood sugar before and after surgery? Improving blood sugar levels before and after surgery helps healing and can limit problems. A way of improving blood sugar control is eating a healthy diet by:  Eating less sugar and carbohydrates  Increasing activity/exercise  Talking with your doctor about reaching your blood sugar goals High blood sugars (greater than 180 mg/dL) can raise your risk of infections and slow your recovery, so you will need to focus on controlling your diabetes during the weeks before surgery. Make sure that the doctor who takes care of your diabetes knows about your planned surgery including the date and location.  How do I manage my blood sugar before surgery? Check your blood sugar at least 4 times a day, starting 2 days before surgery, to make sure that the level is not too high or low.  Check your blood sugar the morning of your surgery when you wake up and every 2 hours until  you get to the Short Stay unit.  If your blood sugar is less than 70 mg/dL, you will need to treat for low blood sugar: Do not take insulin. Treat a low blood sugar (less than 70 mg/dL) with  cup of clear juice (cranberry or apple), 4 glucose tablets, OR glucose gel. Recheck blood sugar in 15 minutes after treatment (to make sure it is greater than 70 mg/dL). If your blood sugar is not greater than 70 mg/dL on recheck, call [phone] for further instructions. Report your blood sugar to the short stay nurse when you  get to Short Stay.  If you are admitted to the hospital after surgery: Your blood sugar will be checked by the staff and you will probably be given insulin after surgery (instead of oral diabetes medicines) to make sure you have good blood sugar levels. The goal for blood sugar control after surgery is 80-180 mg/dL.            Do NOT Smoke (Tobacco/Vaping)  24 hours prior to your procedure  If you use a CPAP at night, you may bring your mask for your overnight stay.   Contacts, glasses, hearing aids, dentures or partials may not be worn into surgery, please bring cases for these belongings   For patients admitted to the hospital, discharge time will be determined by your treatment team.   Patients discharged the day of surgery will not be allowed to drive home, and someone needs to stay with them for 24 hours.   SURGICAL WAITING ROOM VISITATION Patients having surgery or a procedure may have no more than 2 support people in the waiting area - these visitors may rotate.   Children under the age of 52 must have an adult with them who is not the patient. If the patient needs to stay at the hospital during part of their recovery, the visitor guidelines for inpatient rooms apply. Pre-op nurse will coordinate an appropriate time for 1 support person to accompany patient in pre-op.  This support person may not rotate.   Please refer to [email or web address] for the visitor guidelines for Inpatients (after your surgery is over and you are in a regular room).    Special instructions:    Oral Hygiene is also important to reduce your risk of infection.  Remember - BRUSH YOUR TEETH THE MORNING OF SURGERY WITH YOUR REGULAR TOOTHPASTE   Jesse [facility]- Preparing For Surgery  Before surgery, you can play an important role. Because skin is not sterile, your skin needs to be as free of germs as possible. You can reduce the number of germs on your skin by  washing with CHG (chlorahexidine gluconate) Soap before surgery.  CHG is an antiseptic cleaner which kills germs and bonds with the skin to continue killing germs even after washing.     Please do not use if you have an allergy to CHG or antibacterial soaps. If your skin becomes reddened/irritated stop using the CHG.  Do not shave (including legs and underarms) for at least 48 hours prior to first CHG shower. It is OK to shave your face.  Please follow these instructions carefully.     Shower the NIGHT BEFORE SURGERY and the MORNING OF SURGERY with CHG Soap.   If you chose to wash your hair, wash your hair first as usual with your normal shampoo. After you shampoo, rinse your hair and body thoroughly to remove the shampoo.  Then [employer] and genitals (private parts) with your normal soap  and rinse thoroughly to remove soap.  After that Use CHG Soap as you would any other liquid soap. You can apply CHG directly to the skin and wash gently with a scrungie or a clean washcloth.   Apply the CHG Soap to your body ONLY FROM THE NECK DOWN.  Do not use on open wounds or open sores. Avoid contact with your eyes, ears, mouth and genitals (private parts). Wash Face and genitals (private parts)  with your normal soap.   Wash thoroughly, paying special attention to the area where your surgery will be performed.  Thoroughly rinse your body with warm water from the neck down.  DO NOT shower/wash with your normal soap after using and rinsing off the CHG Soap.  Pat yourself dry with a CLEAN TOWEL.  Wear CLEAN PAJAMAS to bed the night before surgery  Place CLEAN SHEETS on your bed the night before your surgery  DO NOT SLEEP WITH PETS.   Day of Surgery:  Take a shower with CHG soap. Do not wear jewelry. Do not wear lotions, powders, cologne or deodorant.  Men may shave face and neck. Do not bring valuables to the hospital.  [facility] is not responsible for any belongings or valuables.    Wear  Clean/Comfortable clothing the morning of surgery .   Remember to brush your teeth WITH YOUR REGULAR TOOTHPASTE.    If you received a COVID test during your pre-op visit, it is requested that you wear a mask when out in public, stay away from anyone that may not be feeling well, and notify your surgeon if you develop symptoms. If you have been in contact with anyone that has tested positive in the last 10 days, please notify your surgeon.    Please read over the following fact sheets that you were given.

## 2023-01-06 ENCOUNTER — Other Ambulatory Visit: Payer: Self-pay

## 2023-01-06 ENCOUNTER — Encounter (HOSPITAL_COMMUNITY): Payer: Self-pay

## 2023-01-06 ENCOUNTER — Encounter (HOSPITAL_COMMUNITY)
Admission: RE | Admit: 2023-01-06 | Discharge: 2023-01-06 | Disposition: A | Discharge status: Home / Self Care | Payer: Medicare Other | Source: Ambulatory Visit | Attending: Surgery | Admitting: Surgery

## 2023-01-06 VITALS — BP 146/84 | HR 55 | Temp 97.9°F | Resp 17 | Ht 71.0 in | Wt 159.7 lb

## 2023-01-06 DIAGNOSIS — I350 Nonrheumatic aortic (valve) stenosis: Secondary | ICD-10-CM | POA: Diagnosis not present

## 2023-01-06 DIAGNOSIS — N183 Chronic kidney disease, stage 3 unspecified: Secondary | ICD-10-CM | POA: Diagnosis not present

## 2023-01-06 DIAGNOSIS — K219 Gastro-esophageal reflux disease without esophagitis: Secondary | ICD-10-CM | POA: Insufficient documentation

## 2023-01-06 DIAGNOSIS — C7989 Secondary malignant neoplasm of other specified sites: Secondary | ICD-10-CM | POA: Insufficient documentation

## 2023-01-06 DIAGNOSIS — E119 Type 2 diabetes mellitus without complications: Secondary | ICD-10-CM

## 2023-01-06 DIAGNOSIS — E1122 Type 2 diabetes mellitus with diabetic chronic kidney disease: Secondary | ICD-10-CM | POA: Insufficient documentation

## 2023-01-06 DIAGNOSIS — Z01812 Encounter for preprocedural laboratory examination: Secondary | ICD-10-CM | POA: Insufficient documentation

## 2023-01-06 DIAGNOSIS — I340 Nonrheumatic mitral (valve) insufficiency: Secondary | ICD-10-CM | POA: Insufficient documentation

## 2023-01-06 DIAGNOSIS — I129 Hypertensive chronic kidney disease with stage 1 through stage 4 chronic kidney disease, or unspecified chronic kidney disease: Secondary | ICD-10-CM | POA: Insufficient documentation

## 2023-01-06 DIAGNOSIS — Z7984 Long term (current) use of oral hypoglycemic drugs: Secondary | ICD-10-CM | POA: Insufficient documentation

## 2023-01-06 DIAGNOSIS — Z01818 Encounter for other preprocedural examination: Secondary | ICD-10-CM

## 2023-01-06 DIAGNOSIS — E785 Hyperlipidemia, unspecified: Secondary | ICD-10-CM | POA: Insufficient documentation

## 2023-01-06 DIAGNOSIS — C50922 Malignant neoplasm of unspecified site of left male breast: Secondary | ICD-10-CM | POA: Diagnosis not present

## 2023-01-06 HISTORY — DX: Malignant neoplasm of unspecified site of left male breast: C50.922

## 2023-01-06 HISTORY — DX: Nonrheumatic aortic (valve) stenosis: I35.0

## 2023-01-06 LAB — BASIC METABOLIC PANEL
Anion gap: 6 (ref 5–15)
BUN: 25 mg/dL — ABNORMAL HIGH (ref 8–23)
CO2: 25 mmol/L (ref 22–32)
Calcium: 8.8 mg/dL — ABNORMAL LOW (ref 8.9–10.3)
Chloride: 109 mmol/L (ref 98–111)
Creatinine, Ser: 1.48 mg/dL — ABNORMAL HIGH (ref 0.61–1.24)
GFR, Estimated: 46 mL/min — ABNORMAL LOW (ref >60)
Glucose, Bld: 92 mg/dL (ref 70–99)
Potassium: 4.6 mmol/L (ref 3.5–5.1)
Sodium: 140 mmol/L (ref 135–145)

## 2023-01-06 LAB — CBC
HCT: 37 % — ABNORMAL LOW (ref 39.0–52.0)
Hemoglobin: 12.4 g/dL — ABNORMAL LOW (ref 13.0–17.0)
MCH: 32.6 pg (ref 26.0–34.0)
MCHC: 33.5 g/dL (ref 30.0–36.0)
MCV: 97.4 fL (ref 80.0–100.0)
Platelets: 153 10*3/uL (ref 150–400)
RBC: 3.8 MIL/uL — ABNORMAL LOW (ref 4.22–5.81)
RDW: 12.9 % (ref 11.5–15.5)
WBC: 5.5 10*3/uL (ref 4.0–10.5)
nRBC: 0 % (ref 0.0–0.2)

## 2023-01-06 LAB — GLUCOSE, CAPILLARY: Glucose-Capillary: 87 mg/dL (ref 70–99)

## 2023-01-06 LAB — HEMOGLOBIN A1C
Hgb A1c MFr Bld: 5.9 % — ABNORMAL HIGH (ref 4.8–5.6)
Mean Plasma Glucose: 122.63 mg/dL

## 2023-01-06 NOTE — Progress Notes (Signed)
PCP - Dr. Georgianne Fick Cardiologist - Dr. Rachelle Hora Croitoru  PPM/ICD - denies   Chest x-ray - 05/13/17 EKG - 05/01/22 Stress Test - 15 years ago, normal per pt ECHO - 05/21/22 Cardiac Cath - denies  Sleep Study - denies   Fasting Blood Sugar - 95-105 Checks Blood Sugar twice a day  Last dose of GLP1 agonist-  n/a   ASA/Blood Thinner Instructions: n/a   ERAS Protcol - yes PRE-SURGERY G2- given at PAT  COVID TEST- n/a   Anesthesia review: yes, cardiac hx  Patient denies shortness of breath, fever, cough and chest pain at PAT appointment   All instructions explained to the patient, with a verbal understanding of the material. Patient agrees to go over the instructions while at home for a better understanding.  The opportunity to ask questions was provided.

## 2023-01-07 ENCOUNTER — Encounter (HOSPITAL_COMMUNITY): Payer: Self-pay | Admitting: Anesthesiology

## 2023-01-07 ENCOUNTER — Encounter (HOSPITAL_COMMUNITY): Payer: Self-pay

## 2023-01-07 ENCOUNTER — Ambulatory Visit (HOSPITAL_COMMUNITY)
Admission: RE | Admit: 2023-01-07 | Discharge: 2023-01-07 | Disposition: A | Discharge status: Home / Self Care | Payer: Medicare Other | Source: Ambulatory Visit | Attending: Hematology and Oncology | Admitting: Hematology and Oncology

## 2023-01-07 ENCOUNTER — Encounter (HOSPITAL_COMMUNITY): Payer: Self-pay | Admitting: Vascular Surgery

## 2023-01-07 DIAGNOSIS — C50922 Malignant neoplasm of unspecified site of left male breast: Secondary | ICD-10-CM | POA: Diagnosis not present

## 2023-01-07 DIAGNOSIS — Z170 Estrogen receptor positive status [ER+]: Secondary | ICD-10-CM | POA: Diagnosis not present

## 2023-01-07 DIAGNOSIS — N289 Disorder of kidney and ureter, unspecified: Secondary | ICD-10-CM | POA: Diagnosis not present

## 2023-01-07 DIAGNOSIS — R591 Generalized enlarged lymph nodes: Secondary | ICD-10-CM | POA: Diagnosis not present

## 2023-01-07 MED ORDER — SODIUM CHLORIDE (PF) 0.9 % IJ SOLN
INTRAMUSCULAR | Status: AC
  Filled 2023-01-07: qty 50

## 2023-01-07 MED ORDER — IOHEXOL 300 MG/ML  SOLN
100.0000 mL | Freq: Once | INTRAMUSCULAR | Status: AC | PRN
  Administered 2023-01-07: 100 mL via INTRAVENOUS

## 2023-01-07 NOTE — Anesthesia Preprocedure Evaluation (Deleted)
Anesthesia Evaluation    Airway        Dental   Pulmonary former smoker          Cardiovascular hypertension,      Neuro/Psych    GI/Hepatic   Endo/Other  diabetes    Renal/GU      Musculoskeletal   Abdominal   Peds  Hematology   Anesthesia Other Findings   Reproductive/Obstetrics                             Anesthesia Physical Anesthesia Plan  ASA:   Anesthesia Plan:    Post-op Pain Management:    Induction:   PONV Risk Score and Plan:   Airway Management Planned:   Additional Equipment:   Intra-op Plan:   Post-operative Plan:   Informed Consent:   Plan Discussed with:   Anesthesia Plan Comments: (PAT note written 01/07/2023 by Shonna Chock, PA-C.  )       Anesthesia Quick Evaluation

## 2023-01-07 NOTE — Progress Notes (Signed)
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  History includes former smoker (quit 09/21/63), HTN, HLD, aortic stenosis (mild), DM2, CKD (stage 3b), GERD, left breast cancer (IDC 12/2022), cholecystectomy (07/04/04; s/p limited sphincterotomy and stone extraction for choledocholithiasis 02/03/15; extended biliary sphincterotomy, stone/sludge removal 05/14/17 for cholangitis).  He is followed by cardiologist Dr. Royann Shivers for murmur. Echo on 05/21/22 showed LVEF 50-55%, no regional wall motion abnormalities, normal RV systolic function, mild MR, moderate AV calcification, trivial AR, mild AS. Repeat echo ~ 1 year recommended. He had more recent preoperative cardiology telephonic evaluation on 12/24/22 by Carlos Levering, NP, "Chart reviewed as part of pre-operative protocol coverage. According to the RCRI, patient has a 0.4% risk of MACE. Patient reports activity equivalent to >4.0 METS (yard work, gym 1-2 times a week using bicycle).    Given past medical history and time since last visit, based on ACC/AHA guidelines, LA DIBELLA would be at acceptable risk for the planned procedure without further cardiovascular testing."   Anesthesia team to evaluate on the day of surgery. 01/07/23 CT chest/abd/pelvis results are in process (ordered for staging purposes by oncologist Dr. Pamelia Hoit).    VS: BP (!) 146/84   Pulse (!) 55   Temp 36.6 C   Resp 17   Ht 5\' 11"  (1.803 m)   Wt 72.4 kg   SpO2 100%   BMI 22.27 kg/m    PROVIDERS: Georgianne Fick, MD is PCP  Thurmon Fair, MD is cardiologist Serena Croissant, MD is  HEM-ONC Lonie Peak, MD is RAD-ONC   LABS: Labs reviewed: Acceptable for surgery.  Comparison Creatine from 03/24/22 at [facility] was 1.58, BUN 24. (all labs ordered are listed, but only abnormal results are displayed)  Labs Reviewed  HEMOGLOBIN A1C - Abnormal; Notable for the following components:      Result Value   Hgb A1c MFr Bld 5.9 (*)    All other components within normal limits  BASIC METABOLIC PANEL - Abnormal; Notable for the following components:   BUN 25 (*)    Creatinine, Ser 1.48 (*)    Calcium 8.8 (*)    GFR, Estimated 46 (*)    All other components within normal limits  CBC - Abnormal; Notable for the following components:   RBC 3.80 (*)    Hemoglobin 12.4 (*)    HCT 37.0 (*)    All other components within normal limits  GLUCOSE, CAPILLARY    IMAGES: CT Chest/Abd/Pelvis 01/07/23: In process.   EKG: 05/01/2022 ([facility]): Sinus rhythm with PACs.  Right bundle branch block.   CV: Echo 05/21/22: IMPRESSIONS   1. Left ventricular ejection fraction, by estimation, is 50 to 55%. The  left ventricle has low normal function. The left ventricle has no regional  wall motion abnormalities. The left ventricular internal cavity size was  mildly dilated. Left ventricular  diastolic parameters are indeterminate. The average left ventricular  global longitudinal strain is -21.0 %. The global longitudinal strain is  normal.   2. Right ventricular systolic function is normal. The right ventricular  size is normal.   3. Left atrial size was moderately dilated.  4. The mitral valve is abnormal. Mild mitral valve regurgitation. No  evidence of mitral stenosis.   5. The aortic valve is normal in structure. There is moderate  calcification of the aortic valve. Aortic valve regurgitation is trivial.  Mild aortic valve stenosis.   6. Aortic dilatation noted. There is moderate dilatation of the ascending  aorta.   7. The inferior vena cava is normal in size with greater  than 50%  respiratory variability, suggesting right atrial pressure of 3 mmHg.    Past Medical History:  Diagnosis Date   Ascending cholangitis 2006, 01/2015   2016: in setting of choledocholithiasis.  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    Tubular adenoma of colon 09/21/2012   on colonoscopy diverticulosis and hemorrhoids noted as well    Past Surgical History:  Procedure Laterality Date   BREAST BIOPSY Left 12/09/2022   [place] LT BREAST BX W LOC DEV 1ST LESION IMG BX SPEC US GUIDE 12/09/2022 [facility] MAMMOGRAPHY   ERCP N/A 02/03/2015   Procedure: ENDOSCOPIC RETROGRADE CHOLANGIOPANCREATOGRAPHY (ERCP);  Surgeon: Jeani Hawking, MD;  Location[facility] ENDOSCOPY;  Service: Endoscopy;  Laterality: N/A;   ERCP N/A 05/14/2017   Procedure: ENDOSCOPIC RETROGRADE CHOLANGIOPANCREATOGRAPHY (ERCP);  Surgeon: Meryl Dare, MD;  Location: [facility];  Service: Endoscopy;  Laterality: N/A;   ERCP W/ SPHICTEROTOMY  06/23/2004    Ampulla in a diverticulum with multiple stones, found in the  common bile duct   fracture arm Left    age 36   LAPAROSCOPIC CHOLECYSTECTOMY  06/23/2004   dr Abbey Chatters   WISDOM TOOTH EXTRACTION      MEDICATIONS:  Alpha Lipoic Acid 200 MG CAPS   amLODipine (NORVASC) 5 MG tablet   atorvastatin (LIPITOR) 40 MG tablet   B Complex CAPS   FARXIGA 10 MG TABS tablet   hydrOXYzine (ATARAX/VISTARIL) 25 MG tablet    KERENDIA 10 MG TABS   Lancets (ONETOUCH DELICA PLUS LANCET33G) MISC   LORazepam (ATIVAN) 1 MG tablet   losartan (COZAAR) 50 MG tablet   metFORMIN (GLUCOPHAGE-XR) 500 MG 24 hr tablet   omeprazole (PRILOSEC) 20 MG capsule   ONETOUCH VERIO test strip   triamterene-hydrochlorothiazide (MAXZIDE) 75-50 MG tablet   No current facility-administered medications for this encounter.  Advised to hold Farxiga for 72 hours prior to surgery.   Shonna Chock, PA-C Surgical Short Stay/Anesthesiology [facility] Phone [phone] [facility] Phone [phone] 01/07/2023 6:56 PM

## 2023-01-12 ENCOUNTER — Other Ambulatory Visit (HOSPITAL_COMMUNITY): Payer: Self-pay

## 2023-01-12 ENCOUNTER — Other Ambulatory Visit: Payer: Self-pay

## 2023-01-12 ENCOUNTER — Telehealth: Payer: Self-pay

## 2023-01-12 ENCOUNTER — Telehealth: Payer: Self-pay | Admitting: Pharmacy Technician

## 2023-01-12 ENCOUNTER — Encounter: Payer: Self-pay | Admitting: *Deleted

## 2023-01-12 ENCOUNTER — Inpatient Hospital Stay: Payer: Medicare Other | Admitting: Hematology and Oncology

## 2023-01-12 VITALS — BP 152/126 | HR 76 | Temp 97.3°F | Resp 18 | Ht 71.0 in | Wt 159.5 lb

## 2023-01-12 DIAGNOSIS — N183 Chronic kidney disease, stage 3 unspecified: Secondary | ICD-10-CM | POA: Diagnosis not present

## 2023-01-12 DIAGNOSIS — R234 Changes in skin texture: Secondary | ICD-10-CM | POA: Diagnosis not present

## 2023-01-12 DIAGNOSIS — K219 Gastro-esophageal reflux disease without esophagitis: Secondary | ICD-10-CM | POA: Diagnosis not present

## 2023-01-12 DIAGNOSIS — Z9049 Acquired absence of other specified parts of digestive tract: Secondary | ICD-10-CM | POA: Diagnosis not present

## 2023-01-12 DIAGNOSIS — Z8719 Personal history of other diseases of the digestive system: Secondary | ICD-10-CM | POA: Diagnosis not present

## 2023-01-12 DIAGNOSIS — Z87891 Personal history of nicotine dependence: Secondary | ICD-10-CM | POA: Diagnosis not present

## 2023-01-12 DIAGNOSIS — E785 Hyperlipidemia, unspecified: Secondary | ICD-10-CM | POA: Diagnosis not present

## 2023-01-12 DIAGNOSIS — I129 Hypertensive chronic kidney disease with stage 1 through stage 4 chronic kidney disease, or unspecified chronic kidney disease: Secondary | ICD-10-CM | POA: Diagnosis not present

## 2023-01-12 DIAGNOSIS — C7951 Secondary malignant neoplasm of bone: Secondary | ICD-10-CM

## 2023-01-12 DIAGNOSIS — Z170 Estrogen receptor positive status [ER+]: Secondary | ICD-10-CM

## 2023-01-12 DIAGNOSIS — C50922 Malignant neoplasm of unspecified site of left male breast: Secondary | ICD-10-CM

## 2023-01-12 DIAGNOSIS — N6489 Other specified disorders of breast: Secondary | ICD-10-CM | POA: Diagnosis not present

## 2023-01-12 DIAGNOSIS — N6453 Retraction of nipple: Secondary | ICD-10-CM | POA: Diagnosis not present

## 2023-01-12 DIAGNOSIS — E1122 Type 2 diabetes mellitus with diabetic chronic kidney disease: Secondary | ICD-10-CM | POA: Diagnosis not present

## 2023-01-12 DIAGNOSIS — Z7984 Long term (current) use of oral hypoglycemic drugs: Secondary | ICD-10-CM | POA: Diagnosis not present

## 2023-01-12 DIAGNOSIS — Z79899 Other long term (current) drug therapy: Secondary | ICD-10-CM | POA: Diagnosis not present

## 2023-01-12 DIAGNOSIS — C50022 Malignant neoplasm of nipple and areola, left male breast: Secondary | ICD-10-CM | POA: Diagnosis not present

## 2023-01-12 MED ORDER — RIBOCICLIB SUCC (400 MG DOSE) 200 MG PO TBPK
400.0000 mg | ORAL_TABLET | Freq: Every day | ORAL | 6 refills | Status: DC
  Filled 2023-01-12: qty 42, 21d supply, fill #0

## 2023-01-12 MED ORDER — TAMOXIFEN CITRATE 20 MG PO TABS: 20.0000 mg | ORAL_TABLET | Freq: Every day | ORAL | 3 refills | Status: DC

## 2023-01-12 MED ORDER — ABEMACICLIB 50 MG PO TABS
50.0000 mg | ORAL_TABLET | Freq: Two times a day (BID) | ORAL | 1 refills | Status: DC
  Filled 2023-01-12: qty 70, 35d supply, fill #0
  Filled 2023-01-15: qty 56, 28d supply, fill #0
  Filled 2023-02-12: qty 56, 28d supply, fill #1
  Filled ????-??-??: fill #2

## 2023-01-12 NOTE — Telephone Encounter (Signed)
Oral Oncology Patient Advocate Encounter  Prior Authorization for Jesse Hoffman has been approved.    PA#  [id number] Effective dates: 01/12/23 through 06/23/23  Patients co-pay is $1,627.97.    Jesse Hoffman, CPhT-Adv Oncology Pharmacy Patient Advocate [facility] Direct Number: [phone]  Fax: [phone]

## 2023-01-12 NOTE — Telephone Encounter (Signed)
Oral Oncology Patient Advocate Encounter  Prior Authorization for Jesse Hoffman has been approved.    PA# [id number] Effective dates: 01/12/23 through 06/23/23  Patients co-pay is $1,627.97.    Jinger Neighbors, CPhT-Adv Oncology Pharmacy Patient Advocate [facility] Direct Number: [phone]  Fax: [phone]

## 2023-01-12 NOTE — Telephone Encounter (Signed)
Oral Oncology Patient Advocate Encounter   Received notification that prior authorization for Verzenio is required.   PA submitted on 01/12/23 Key [id number] Status is pending     Jinger Neighbors, CPhT-Adv Oncology Pharmacy Patient Advocate [facility] Direct Number: [phone]  Fax: [phone]

## 2023-01-12 NOTE — Progress Notes (Signed)
Patient Care Team: Georgianne Fick, MD as PCP - General (Internal Medicine) Croitoru, Rachelle Hora, MD as PCP - Cardiology (Cardiology) Pershing Proud, RN as Oncology Nurse Navigator Donnelly Angelica, RN as Oncology Nurse Navigator Serena Croissant, MD as Consulting Physician (Hematology and Oncology)  DIAGNOSIS:  Encounter Diagnosis  Name Primary?   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 Ultrasound: 3.9 cm irregular hypoechoic mass retroareolar left breast, 4 abnormal left axillary lymph nodes with cortical thickening, mild right gynecomastia. Biopsy: Grade 2 IDC, LVI present, lymph node positive, ER 100%, PR 50%, Ki-67 30%, HER2 2+, FISH negative ratio 1.08    12/23/2022 Cancer Staging   Staging form: Breast, AJCC 8th Edition - Clinical: Stage IIA (cT2, cN1, cM0, G2, ER+, PR+, HER2-) - Signed by Serena Croissant, MD on 12/23/2022 Stage prefix: Initial diagnosis Histologic grading system: 3 grade system   12/30/2022 Genetic Testing   Negative [facility] Common Hereditary Cancers +RNA Panel.  Report date is 12/30/2022.    The [facility] Common Hereditary Cancers + RNA Panel includes sequencing, deletion/duplication, and RNA analysis of the following 48 genes: APC, ATM, AXIN2, BAP1, BARD1, BMPR1A, BRCA1, BRCA2, BRIP1, CDH1, CDK4*, CDKN2A*, CHEK2, CTNNA1, DICER1, EPCAM* (del/dup only), FH, GREM1* (promoter dup analysis only), HOXB13*, KIT*, MBD4*, MEN1, MLH1, MSH2, MSH3, MSH6, MUTYH, NF1, NTHL1, PALB2, PDGFRA*, PMS2, POLD1, POLE, PTEN, RAD51C, RAD51D, SDHA (sequencing only), SDHB, SDHC, SDHD, SMAD4, SMARCA4, STK11, TP53, TSC1, TSC2, VHL.  *Genes without RNA analysis.       CHIEF COMPLIANT: Follow-up to discuss results of CT scans  INTERVAL HISTORY: Jesse Hoffman is a 87 year old above mentioned history of left breast cancer who underwent CT scans and is here today to discuss results of the scans.  Unfortunately the CT scan showed widespread metastatic disease.  He was originally planned to undergo mastectomy but is here today to talk about the treatment plan in view of the evidence of metastatic disease.  He does not have any painful symptoms in his bones.  He feels relatively fine with some mild discomfort in the left breast related to the tumor.   ALLERGIES:  is allergic to allopurinol.  MEDICATIONS:  Current Outpatient Medications  Medication Sig Dispense Refill   abemaciclib (VERZENIO) 50 MG tablet Take 1 tablet (50 mg total) by mouth 2 (two) times daily. 60 tablet 1   tamoxifen (NOLVADEX) 20 MG tablet Take 1 tablet (20 mg total) by mouth daily. 90 tablet 3   Alpha Lipoic Acid 200 MG CAPS Take 200 mg by mouth daily in the afternoon.     amLODipine (NORVASC) 5 MG tablet Take 5 mg by mouth daily.     atorvastatin (LIPITOR) 40 MG tablet Take 1 tablet (40 mg total) by mouth daily at 6 PM. Restart after 1 week, when Liver function tests normalize     B Complex CAPS Take 1 capsule by mouth daily.     FARXIGA 10 MG TABS tablet Take 10 mg by mouth daily.     hydrOXYzine (ATARAX/VISTARIL) 25 MG tablet Take 1 tablet (25 mg total) every 6 (six) hours as needed by mouth for itching. (Patient not taking: Reported on 01/01/2023) 30 tablet 0   KERENDIA 10 MG TABS Take 10 mg  by mouth daily.     Lancets (ONETOUCH DELICA PLUS LANCET33G) MISC Apply topically 2 (two) times daily.     LORazepam (ATIVAN) 1 MG tablet Take 1 tablet (1 mg total) every 6 (six) hours as needed by mouth for sleep. (Patient not taking: Reported on 01/01/2023) 15 tablet 0   losartan (COZAAR) 50 MG tablet Take 50 mg by mouth 2 (two) times daily.     metFORMIN (GLUCOPHAGE-XR) 500 MG 24 hr tablet  Take 1 tablet (500 mg total) by mouth every evening. Restart om 05/20/17 30 tablet 1   omeprazole (PRILOSEC) 20 MG capsule Take 1 capsule (20 mg total) by mouth daily. 30 capsule 3   ONETOUCH VERIO test strip in the morning and at bedtime.     triamterene-hydrochlorothiazide (MAXZIDE) 75-50 MG tablet Take 0.5 tablets by mouth every morning. (Patient not taking: Reported on 01/01/2023) 15 tablet 0   No current facility-administered medications for this visit.    PHYSICAL EXAMINATION: ECOG PERFORMANCE STATUS: 1 - Symptomatic but completely ambulatory  Vitals:   01/12/23 1404 01/12/23 1405  BP: (!) 155/122 (!) 152/126  Pulse: 76   Resp: 18   Temp: (!) 97.3 F (36.3 C)   SpO2: 97%    Filed Weights   01/12/23 1404  Weight: 159 lb 8 oz (72.3 kg)      LABORATORY DATA:  I have reviewed the data as listed    Latest Ref Rng & Units 01/06/2023    3:26 PM 10/26/2020    2:13 PM 06/18/2017   10:55 AM  CMP  Glucose 70 - 99 mg/dL 92  564    BUN 8 - 23 mg/dL 25  20    Creatinine 3.32 - 1.24 mg/dL 9.51  8.84    Sodium 166 - 145 mmol/L 140  129    Potassium 3.5 - 5.1 mmol/L 4.6  3.7    Chloride 98 - 111 mmol/L 109  94    CO2 22 - 32 mmol/L 25  23    Calcium 8.9 - 10.3 mg/dL 8.8  9.4    Total Protein 6.5 - 8.1 g/dL  7.1  6.5   Total Bilirubin 0.3 - 1.2 mg/dL  1.6  1.2   Alkaline Phos 38 - 126 U/L  97  105   AST 15 - 41 U/L  22  21   ALT 0 - 44 U/L  20  18     Lab Results  Component Value Date   WBC 5.5 01/06/2023   HGB 12.4 (L) 01/06/2023   HCT 37.0 (L) 01/06/2023   MCV 97.4 01/06/2023   PLT 153 01/06/2023   NEUTROABS 3.1 10/26/2020    ASSESSMENT & PLAN:  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 Ultrasound: 3.9 cm irregular hypoechoic mass retroareolar left breast, 4 abnormal left axillary lymph nodes with cortical thickening,  mild right gynecomastia. 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  Treatment plan: No role of surgery given the metastatic disease. Palliative treatment options were discussed  including treatment with tamoxifen with Abemaciclib (Patient has a QTC of 500 and therefore not a candidate for ribociclib) Bisphosphonates for bone metastases.  RTC in 3 weeks to see Jonny Ruiz   Orders Placed This Encounter  Procedures   CBC with Differential (Cancer Center Only)    Standing Status:   Future    Standing Expiration Date:   01/12/2024   CMP (Cancer Center only)    Standing Status:   Future    Standing Expiration Date:   01/12/2024   CA 27.29    Standing Status:   Future    Standing Expiration Date:   01/12/2024   CA 15.3    Standing Status:   Future    Standing Expiration Date:   01/12/2024   The patient has a good understanding of the overall plan. he agrees with it. he will call with any problems that may develop before the next visit here. Total time spent: 45 mins including face to face time and time spent for planning, charting and co-ordination of care   Tamsen Meek, MD 01/12/23

## 2023-01-12 NOTE — Telephone Encounter (Signed)
Oral Oncology Patient Advocate Encounter   Received notification that prior authorization for Jesse Hoffman is required.   PA submitted on 01/12/23 Key [id number] Status is pending     Jinger Neighbors, CPhT-Adv Oncology Pharmacy Patient Advocate [facility] Direct Number: [phone]  Fax: [phone]

## 2023-01-12 NOTE — Assessment & Plan Note (Addendum)
12/09/2022: mammogram detected asymmetry/mass retroareolar left breast with left nipple retraction and skin thickening. Ultrasound: 3.9 cm irregular hypoechoic mass retroareolar left breast, 4 abnormal left axillary lymph nodes with cortical thickening, mild right gynecomastia. 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  Treatment plan: No role of surgery given the metastatic disease. Palliative treatment options were discussed including treatment with tamoxifen with Abemaciclib (Patient has a QTC of 500 and therefore not a candidate for ribociclib) Bisphosphonates for bone metastases.  RTC in 3 weeks to see Jonny Ruiz

## 2023-01-12 NOTE — Addendum Note (Signed)
Addended by: Serena Croissant on: 01/12/2023 03:46 PM   Modules accepted: Orders

## 2023-01-13 ENCOUNTER — Inpatient Hospital Stay: Admission: RE | Admit: 2023-01-13 | Payer: Medicare Other | Source: Ambulatory Visit

## 2023-01-13 ENCOUNTER — Encounter: Payer: Self-pay | Admitting: Hematology and Oncology

## 2023-01-13 ENCOUNTER — Telehealth: Payer: Self-pay | Admitting: Radiation Oncology

## 2023-01-13 NOTE — Telephone Encounter (Signed)
7/23 @ 10:05 am spoke to patient he is aware all his appointments has been canceled at this time with Dr. Basilio Cairo due to his surgery being cancel -per Marianne Sofia.

## 2023-01-13 NOTE — Telephone Encounter (Signed)
Oral Oncology Pharmacist Encounter  Received new prescription for abemaciclib (Verzenio) for the treatment of metastatic breast cancer in conjunction with tamoxifen, planned duration until disease progression or unacceptable toxicity.  Labs from 01/06/23 (CBC) assessed, no interventions needed. Sent message to MD regarding labs as last CMP was from > 2 years ago. Prescription dose and frequency assessed for appropriateness.  Current medication list in Epic reviewed, DDIs with Verzenio identified: -metformin: verzenio may increase the concentration of metformin. Will have patient monitor for increase side effects of metformin.   Evaluated chart and no patient barriers to medication adherence noted.   Patient agreement for treatment documented in MD note on 01/12/23.  Prescription has been e-scribed to the [facility] for benefits analysis and approval.  Oral Oncology Clinic will continue to follow for insurance authorization, copayment issues, initial counseling and start date.  Bethel Born, PharmD Hematology/Oncology Clinical Pharmacist [facility] Oral Chemotherapy Navigation Clinic [phone] 01/13/2023 8:07 AM

## 2023-01-14 ENCOUNTER — Inpatient Hospital Stay: Payer: Medicare Other

## 2023-01-14 ENCOUNTER — Ambulatory Visit (HOSPITAL_COMMUNITY): Admission: RE | Admit: 2023-01-14 | Payer: Medicare Other | Source: Home / Self Care | Admitting: Surgery

## 2023-01-14 ENCOUNTER — Encounter (HOSPITAL_COMMUNITY): Admission: RE | Payer: Self-pay | Source: Home / Self Care

## 2023-01-14 ENCOUNTER — Other Ambulatory Visit: Payer: Self-pay

## 2023-01-14 ENCOUNTER — Telehealth: Payer: Self-pay

## 2023-01-14 ENCOUNTER — Ambulatory Visit (HOSPITAL_COMMUNITY): Payer: Medicare Other

## 2023-01-14 DIAGNOSIS — N6489 Other specified disorders of breast: Secondary | ICD-10-CM | POA: Diagnosis not present

## 2023-01-14 DIAGNOSIS — C50022 Malignant neoplasm of nipple and areola, left male breast: Secondary | ICD-10-CM | POA: Diagnosis not present

## 2023-01-14 DIAGNOSIS — Z79899 Other long term (current) drug therapy: Secondary | ICD-10-CM | POA: Diagnosis not present

## 2023-01-14 DIAGNOSIS — E1122 Type 2 diabetes mellitus with diabetic chronic kidney disease: Secondary | ICD-10-CM | POA: Diagnosis not present

## 2023-01-14 DIAGNOSIS — N6453 Retraction of nipple: Secondary | ICD-10-CM | POA: Diagnosis not present

## 2023-01-14 DIAGNOSIS — Z170 Estrogen receptor positive status [ER+]: Secondary | ICD-10-CM

## 2023-01-14 DIAGNOSIS — Z9049 Acquired absence of other specified parts of digestive tract: Secondary | ICD-10-CM | POA: Diagnosis not present

## 2023-01-14 DIAGNOSIS — E785 Hyperlipidemia, unspecified: Secondary | ICD-10-CM | POA: Diagnosis not present

## 2023-01-14 DIAGNOSIS — N183 Chronic kidney disease, stage 3 unspecified: Secondary | ICD-10-CM | POA: Diagnosis not present

## 2023-01-14 DIAGNOSIS — K219 Gastro-esophageal reflux disease without esophagitis: Secondary | ICD-10-CM | POA: Diagnosis not present

## 2023-01-14 DIAGNOSIS — R234 Changes in skin texture: Secondary | ICD-10-CM | POA: Diagnosis not present

## 2023-01-14 DIAGNOSIS — Z87891 Personal history of nicotine dependence: Secondary | ICD-10-CM | POA: Diagnosis not present

## 2023-01-14 DIAGNOSIS — Z8719 Personal history of other diseases of the digestive system: Secondary | ICD-10-CM | POA: Diagnosis not present

## 2023-01-14 DIAGNOSIS — I129 Hypertensive chronic kidney disease with stage 1 through stage 4 chronic kidney disease, or unspecified chronic kidney disease: Secondary | ICD-10-CM | POA: Diagnosis not present

## 2023-01-14 DIAGNOSIS — Z7984 Long term (current) use of oral hypoglycemic drugs: Secondary | ICD-10-CM | POA: Diagnosis not present

## 2023-01-14 LAB — CMP (CANCER CENTER ONLY)
ALT: 17 U/L (ref 0–44)
AST: 20 U/L (ref 15–41)
Albumin: 4.3 g/dL (ref 3.5–5.0)
Alkaline Phosphatase: 75 U/L (ref 38–126)
Anion gap: 5 (ref 5–15)
BUN: 24 mg/dL — ABNORMAL HIGH (ref 8–23)
CO2: 30 mmol/L (ref 22–32)
Calcium: 9.8 mg/dL (ref 8.9–10.3)
Chloride: 103 mmol/L (ref 98–111)
Creatinine: 1.57 mg/dL — ABNORMAL HIGH (ref 0.61–1.24)
GFR, Estimated: 43 mL/min — ABNORMAL LOW (ref >60)
Glucose, Bld: 124 mg/dL — ABNORMAL HIGH (ref 70–99)
Potassium: 4.5 mmol/L (ref 3.5–5.1)
Sodium: 138 mmol/L (ref 135–145)
Total Bilirubin: 1.4 mg/dL — ABNORMAL HIGH (ref 0.3–1.2)
Total Protein: 6.5 g/dL (ref 6.5–8.1)

## 2023-01-14 LAB — CBC WITH DIFFERENTIAL (CANCER CENTER ONLY)
Abs Immature Granulocytes: 0.01 10*3/uL (ref 0.00–0.07)
Basophils Absolute: 0 10*3/uL (ref 0.0–0.1)
Basophils Relative: 0 %
Eosinophils Absolute: 0.1 10*3/uL (ref 0.0–0.5)
Eosinophils Relative: 2 %
HCT: 36.4 % — ABNORMAL LOW (ref 39.0–52.0)
Hemoglobin: 12.6 g/dL — ABNORMAL LOW (ref 13.0–17.0)
Immature Granulocytes: 0 %
Lymphocytes Relative: 36 %
Lymphs Abs: 2 10*3/uL (ref 0.7–4.0)
MCH: 32.6 pg (ref 26.0–34.0)
MCHC: 34.6 g/dL (ref 30.0–36.0)
MCV: 94.3 fL (ref 80.0–100.0)
Monocytes Absolute: 0.4 10*3/uL (ref 0.1–1.0)
Monocytes Relative: 8 %
Neutro Abs: 3 10*3/uL (ref 1.7–7.7)
Neutrophils Relative %: 54 %
Platelet Count: 143 10*3/uL — ABNORMAL LOW (ref 150–400)
RBC: 3.86 MIL/uL — ABNORMAL LOW (ref 4.22–5.81)
RDW: 12.7 % (ref 11.5–15.5)
WBC Count: 5.6 10*3/uL (ref 4.0–10.5)
nRBC: 0 % (ref 0.0–0.2)

## 2023-01-14 SURGERY — MASTECTOMY WITH AXILLARY LYMPH NODE DISSECTION
Anesthesia: General | Laterality: Left

## 2023-01-14 NOTE — Telephone Encounter (Signed)
Called Pt regarding lab only appt. Per Hector Shade, RPH, Pt needs recent CMP for baseline before starting Verzenio. CMP ordered per MD and lab only appt scheduled for same day. Pt verbalized understanding.

## 2023-01-15 ENCOUNTER — Telehealth: Payer: Self-pay | Admitting: Pharmacy Technician

## 2023-01-15 ENCOUNTER — Encounter: Payer: Self-pay | Admitting: Hematology and Oncology

## 2023-01-15 ENCOUNTER — Other Ambulatory Visit (HOSPITAL_COMMUNITY): Payer: Self-pay

## 2023-01-15 LAB — CANCER ANTIGEN 27.29: CA 27.29: 756.1 U/mL — ABNORMAL HIGH (ref 0.0–38.6)

## 2023-01-15 NOTE — Telephone Encounter (Signed)
Oral Oncology Patient Advocate Encounter   Was successful in securing patient a $11,500 grant from [employer] (PA[employer]) to provide copayment coverage for Verzenio.  This will keep the out of pocket expense at $0.     I have spoken with the patient.    The billing information is as follows and has been shared with [facility].   RxBin: [id number] PCN:  [id number] Member ID: [id number] Group ID: [id number] Dates of Eligibility: 01/15/23 through 01/15/24  Jinger Neighbors, CPhT-Adv Oncology Pharmacy Patient Advocate [facility] Direct Number: [phone]  Fax: [phone]

## 2023-01-15 NOTE — Telephone Encounter (Signed)
Oral Chemotherapy Pharmacist Encounter  I spoke with patient for overview of: Verzenio for the treatment of metastatic breast cancer, hormone-receptor positive breast cancer, in combination with tamoxifen, planned duration until disease progression or unacceptable toxicity.   Counseled patient on administration, dosing, side effects, monitoring, drug-food interactions, safe handling, storage, and disposal.  Patient will take Verzenio 50mg  tablets, 1 tablet by mouth twice daily without regard to food.  Patient knows to avoid grapefruit and grapefruit juice.  Verzenio start date: 01/16/2023  Adverse effects include but are not limited to: diarrhea, fatigue, nausea, abdominal pain, decreased blood counts, and increased liver function tests, and joint pains. Severe, life-threatening, and/or fatal interstitial lung disease (ILD) and/or pneumonitis may occur with CDK 4/6 inhibitors.  Patient has anti-emetic on hand and knows to take it if nausea develops.   Patient will obtain anti diarrheal and alert the office of 4 or more loose stools above baseline.  Reviewed with patient importance of keeping a medication schedule and plan for any missed doses. No barriers to medication adherence identified.  Medication reconciliation performed and medication/allergy list updated.  Insurance authorization for [employer] has been obtained. Test claim at the pharmacy revealed copayment $1,627.97 for 1st fill of 28 days. This will ship from the [facility] outpatient pharmacy on 12/23/22 to deliver to patient's home on 01/16/23.  Patient informed the pharmacy will reach out 5-7 days prior to needing next fill of Verzenio to coordinate continued medication acquisition to prevent break in therapy.  All questions answered.  Patient voiced understanding and appreciation.   Medication education handout placed in mail for patient. Patient knows to call the office with questions or concerns. Oral Chemotherapy Clinic  phone number provided to patient.   Bethel Born, PharmD Hematology/Oncology Clinical Pharmacist [facility] Oral Chemotherapy Navigation Clinic [phone] 01/15/2023   11:13 AM

## 2023-01-16 ENCOUNTER — Other Ambulatory Visit (HOSPITAL_COMMUNITY): Payer: Self-pay

## 2023-01-20 ENCOUNTER — Encounter: Payer: Self-pay | Admitting: *Deleted

## 2023-01-26 ENCOUNTER — Telehealth: Payer: Self-pay

## 2023-01-26 NOTE — Telephone Encounter (Signed)
Pt called with several questions regarding Verzenio, such as foods/drinks he should or should not avoid, as well as other medications and if there are contraindications. Advised pt I would speak with Yvonna Alanis, our pharmacist to reach out to pt with any questions.

## 2023-01-26 NOTE — Telephone Encounter (Signed)
Oral Oncology Pharmacist Encounter   Received message from Vernona Rieger that patient had questions regarding the Verzenio. Patient had seen information regarding that he would not be able to eat a normal diet although once patient discussed what he had read that he was reading what types of foods to avoid if patient is having significant diarrhea.  Patient also states that he has been having trouble with falling asleep and is wondering if there was any drug interactions with verzenio and melatonin. I notified patient that the two medications do not interact and that he can try this medication found over the counter.   Patient appreciated the information and will call if he has any further questions.  Bethel Born, PharmD Hematology/Oncology Clinical Pharmacist [facility] Oral Chemotherapy Navigation Clinic [phone] 01/26/2023 4:13 PM

## 2023-01-27 ENCOUNTER — Encounter: Payer: Self-pay | Admitting: Hematology and Oncology

## 2023-01-28 ENCOUNTER — Telehealth: Payer: Self-pay

## 2023-01-28 NOTE — Telephone Encounter (Signed)
Pt called and states he is having trouble with insomnia. He wonders if this is related to Verzenio as he states he has never had trouble with sleep. He reports he tried taking melatonin 5 mg X 2 nights with no success. Reports he is only sleeping 2-3 hrs per night. Asks if there is anything Dr Pamelia Hoit can prescribe. If so, he would like it sent to [facility]. Advised pt I would make MD aware of this and we would be in touch with MD recommendation. He verbalized thanks and understanding.

## 2023-01-29 ENCOUNTER — Telehealth: Payer: Self-pay

## 2023-01-29 ENCOUNTER — Other Ambulatory Visit: Payer: Self-pay | Admitting: Hematology and Oncology

## 2023-01-29 MED ORDER — ZOLPIDEM TARTRATE 5 MG PO TABS: 5.0000 mg | ORAL_TABLET | Freq: Every evening | ORAL | 1 refills | Status: AC | PRN

## 2023-01-29 NOTE — Telephone Encounter (Signed)
Called Pt regarding VM left yesterday discussing insomnia. MD has called in Ambien to requested pharmacy. Pt verbalized understanding.

## 2023-02-03 ENCOUNTER — Other Ambulatory Visit (HOSPITAL_COMMUNITY): Payer: Self-pay

## 2023-02-04 ENCOUNTER — Ambulatory Visit: Payer: Medicare Other | Admitting: Hematology and Oncology

## 2023-02-05 ENCOUNTER — Other Ambulatory Visit (HOSPITAL_COMMUNITY): Payer: Self-pay

## 2023-02-06 ENCOUNTER — Other Ambulatory Visit: Payer: Self-pay

## 2023-02-06 ENCOUNTER — Other Ambulatory Visit (HOSPITAL_COMMUNITY): Payer: Self-pay

## 2023-02-09 ENCOUNTER — Other Ambulatory Visit: Payer: Self-pay

## 2023-02-10 ENCOUNTER — Ambulatory Visit: Payer: Medicare Other

## 2023-02-10 ENCOUNTER — Ambulatory Visit: Payer: Medicare Other | Admitting: Radiation Oncology

## 2023-02-10 ENCOUNTER — Other Ambulatory Visit: Payer: Self-pay

## 2023-02-10 DIAGNOSIS — Z170 Estrogen receptor positive status [ER+]: Secondary | ICD-10-CM

## 2023-02-11 ENCOUNTER — Other Ambulatory Visit (HOSPITAL_COMMUNITY): Payer: Self-pay

## 2023-02-11 ENCOUNTER — Inpatient Hospital Stay: Payer: Medicare Other | Attending: Hematology and Oncology

## 2023-02-11 ENCOUNTER — Inpatient Hospital Stay: Payer: Medicare Other | Admitting: Pharmacist

## 2023-02-11 VITALS — BP 128/50 | HR 113 | Temp 97.3°F | Resp 18 | Ht 71.0 in | Wt 160.5 lb

## 2023-02-11 DIAGNOSIS — Z170 Estrogen receptor positive status [ER+]: Secondary | ICD-10-CM | POA: Insufficient documentation

## 2023-02-11 DIAGNOSIS — C50022 Malignant neoplasm of nipple and areola, left male breast: Secondary | ICD-10-CM | POA: Insufficient documentation

## 2023-02-11 LAB — CBC WITH DIFFERENTIAL (CANCER CENTER ONLY)
Abs Immature Granulocytes: 0.01 10*3/uL (ref 0.00–0.07)
Basophils Absolute: 0 10*3/uL (ref 0.0–0.1)
Basophils Relative: 1 %
Eosinophils Absolute: 0.1 10*3/uL (ref 0.0–0.5)
Eosinophils Relative: 1 %
HCT: 32.2 % — ABNORMAL LOW (ref 39.0–52.0)
Hemoglobin: 10.7 g/dL — ABNORMAL LOW (ref 13.0–17.0)
Immature Granulocytes: 0 %
Lymphocytes Relative: 46 %
Lymphs Abs: 1.9 10*3/uL (ref 0.7–4.0)
MCH: 32.5 pg (ref 26.0–34.0)
MCHC: 33.2 g/dL (ref 30.0–36.0)
MCV: 97.9 fL (ref 80.0–100.0)
Monocytes Absolute: 0.2 10*3/uL (ref 0.1–1.0)
Monocytes Relative: 5 %
Neutro Abs: 2 10*3/uL (ref 1.7–7.7)
Neutrophils Relative %: 47 %
Platelet Count: 132 10*3/uL — ABNORMAL LOW (ref 150–400)
RBC: 3.29 MIL/uL — ABNORMAL LOW (ref 4.22–5.81)
RDW: 12.7 % (ref 11.5–15.5)
WBC Count: 4.2 10*3/uL (ref 4.0–10.5)
nRBC: 0 % (ref 0.0–0.2)

## 2023-02-11 LAB — CMP (CANCER CENTER ONLY)
ALT: 20 U/L (ref 0–44)
AST: 19 U/L (ref 15–41)
Albumin: 3.7 g/dL (ref 3.5–5.0)
Alkaline Phosphatase: 91 U/L (ref 38–126)
Anion gap: 9 (ref 5–15)
BUN: 34 mg/dL — ABNORMAL HIGH (ref 8–23)
CO2: 20 mmol/L — ABNORMAL LOW (ref 22–32)
Calcium: 8.3 mg/dL — ABNORMAL LOW (ref 8.9–10.3)
Chloride: 105 mmol/L (ref 98–111)
Creatinine: 2.03 mg/dL — ABNORMAL HIGH (ref 0.61–1.24)
GFR, Estimated: 32 mL/min — ABNORMAL LOW (ref >60)
Glucose, Bld: 107 mg/dL — ABNORMAL HIGH (ref 70–99)
Potassium: 4.8 mmol/L (ref 3.5–5.1)
Sodium: 134 mmol/L — ABNORMAL LOW (ref 135–145)
Total Bilirubin: 0.8 mg/dL (ref 0.3–1.2)
Total Protein: 6.8 g/dL (ref 6.5–8.1)

## 2023-02-11 MED ORDER — OYSTER SHELL CALCIUM/D3 500-5 MG-MCG PO TABS: 1.0000 | ORAL_TABLET | Freq: Every day | ORAL | 5 refills | Status: AC

## 2023-02-11 NOTE — Progress Notes (Signed)
[facility]       Telephone: [phone]?Fax: [phone]   Oncology Clinical Pharmacist Practitioner Initial Assessment  Jesse Hoffman is a 87 y.o. male with a diagnosis of breast cancer. They were contacted today via in-person visit. He is accompanied by his wife Fannie Knee.  Indication/Regimen Abemaciclib (Verzenio) is being used appropriately for treatment of metastatic breast cancer by Dr. Serena Croissant.      Wt Readings from Last 1 Encounters:  02/11/23 160 lb 8 oz (72.8 kg)    Estimated body surface area is 1.91 meters squared as calculated from the following:   Height as of this encounter: 5\' 11"  (1.803 m).   Weight as of this encounter: 160 lb 8 oz (72.8 kg).  The dosing regimen is 50 mg by mouth every 12 hours on days 1 to 28 of a 28-day cycle. This is being given  in combination with tamoxifen. Dr. Pamelia Hoit has put in denosumab 120 mg Rivka Barbara) orders. They are pending authorization and dental clearance. Dental clearance form was given today . This regimen is planned to continue until disease progression or unacceptable toxicity. Jesse Hoffman was seen today by clinical pharmacy to establish care with his abemaciclib after being referred by Dr. Pamelia Hoit. He last saw Dr. Pamelia Hoit on 01/12/23.   Patient started on abemaciclib on 01/14/23 and so far is not experiencing any nausea, vomiting, or diarrhea. His main complaint is fatigue. He states he did have some constipation that was relieved with a laxative. We did stress the importance of drinking plenty of fluids. His creatinine has increased slightly from baseline value of 1.57 mg/dL on 07/22/84. Sodium is slightly below LLN and calcium is low as well. He is asymptomatic at this time. We did prescribe a calcium/vitamin D supplement today and this was sent to his local pharmacy of choice. These labs will be closely monitored.   His blood pressure, diabetes, and cholesterol are being managed by his PCP Dr. Nicholos Johns from  [facility] per Mr. Lindo's report. We discussed the importance of monitoring home blood sugars and blood pressures so that his will help with any adjusts needed if applicable in the future.   Dose Modifications Dr. Pamelia Hoit is prescribing abemaciclib at a reduced dose of 50 mg BID  Access Assessment Jesse Hoffman will be receiving abemaciclib through [facility] Concerns: none Start date if known: 01/16/23  Allergies Allergies  Allergen Reactions   Allopurinol     hepatotoxicity    Vitals    02/11/2023    4:02 PM 01/12/2023    2:05 PM 01/12/2023    2:04 PM  Oncology Vitals  Height 180 cm  180 cm  Weight 72.802 kg  72.349 kg  Weight (lbs) 160 lbs 8 oz  159 lbs 8 oz  BMI 22.39 kg/m2  22.25 kg/m2  Temp 97.3 F (36.3 C)  97.3 F (36.3 C)  Pulse Rate 113  76  BP 128/50 152/126 155/122  Resp 18  18  SpO2 100 %  97 %  BSA (m2) 1.91 m2  1.9 m2     Laboratory Data    Latest Ref Rng & Units 02/11/2023    3:16 PM 01/14/2023    2:21 PM 01/06/2023    3:26 PM  CBC EXTENDED  WBC 4.0 - 10.5 K/uL 4.2  5.6  5.5   RBC 4.22 - 5.81 MIL/uL 3.29  3.86  3.80   Hemoglobin 13.0 - 17.0 g/dL 57.8  12.6  12.4   HCT 39.0 - 52.0 % 32.2  36.4  37.0   Platelets 150 - 400 K/uL 132  143  153   NEUT# 1.7 - 7.7 K/uL 2.0  3.0    Lymph# 0.7 - 4.0 K/uL 1.9  2.0         Latest Ref Rng & Units 02/11/2023    3:16 PM 01/14/2023    2:21 PM 01/06/2023    3:26 PM  CMP  Glucose 70 - 99 mg/dL 161  096  92   BUN 8 - 23 mg/dL 34  24  25   Creatinine 0.61 - 1.24 mg/dL 0.45  4.09  8.11   Sodium 135 - 145 mmol/L 134  138  140   Potassium 3.5 - 5.1 mmol/L 4.8  4.5  4.6   Chloride 98 - 111 mmol/L 105  103  109   CO2 22 - 32 mmol/L 20  30  25    Calcium 8.9 - 10.3 mg/dL 8.3  9.8  8.8   Total Protein 6.5 - 8.1 g/dL 6.8  6.5    Total Bilirubin 0.3 - 1.2 mg/dL 0.8  1.4    Alkaline Phos 38 - 126 U/L 91  75    AST 15 - 41 U/L 19  20    ALT 0 - 44 U/L 20  17     Lab  Results  Component Value Date   CA2729 756.1 (H) 01/14/2023   Lab Results  Component Value Date   BJY782 731.0 (H) 01/14/2023    Contraindications Contraindications were reviewed? Yes Contraindications to therapy were identified? No   Safety Precautions The following safety precautions for the use of abemaciclib were reviewed:  Changes in kidney function: importance of drinking plenty of fluids and monitoring urine output Diarrhea: we reviewed that diarrhea is common with abemaciclib and confirmed that she does have loperamide (Imodium) at home.  We reviewed how to take this medication PRN and gave her information on abemaciclib Decreased white blood cells (WBCs) and increased risk for infection: we discussed the importance of having a thermometer and what the Centers for Disease Control and Prevention (CDC) considers a fever which is 100.37F (38C) or higher.  Gave patient 24/7 triage line to call if any fevers or symptoms Decreased hemoglobin, part of red blood cells that carry iron and oxygen Fatigue Nausea and Vomiting Hepatotoxicity: reviewed to contact clinic for RUQ pain that will not subside, yellowing of eyes/skin Decreased appetite or weight loss Abdominal pain Decreased platelet count and increased risk for bleeding Venous thromboembolism (VTE): reviewed signs of deep vein thrombosis (DVT) such as leg swelling, redness, pain, or tenderness and signs of pulmonary embolism (PE) such as shortness of breath, rapid or irregular heartbeat, cough, chest pain, or lightheadedness ILD/Pneumonitis: we reviewed potential symptoms including cough, shortness, and fatigue. Handling body fluids and waste Pregnancy, sexual activity, and contraception Reviewed to take the medication every 12 hours (with food sometimes can be easier on the stomach) and to take it at the same time every day. Discussed proper storage and handling of abemaciclib  Medication Reconciliation Current Outpatient  Medications  Medication Sig Dispense Refill   abemaciclib (VERZENIO) 50 MG tablet Take 1 tablet (50 mg total) by mouth 2 (two) times daily. 60 tablet 1   amLODipine (NORVASC) 5 MG tablet Take 5 mg by mouth daily.     atorvastatin (LIPITOR) 40 MG tablet Take 1 tablet (40 mg total) by mouth daily at 6 PM. Restart after 1 week,  when Liver function tests normalize     calcium-vitamin D (OSCAL WITH D) 500-5 MG-MCG tablet Take 1 tablet by mouth daily. 30 tablet 5   FARXIGA 10 MG TABS tablet Take 10 mg by mouth daily.     KERENDIA 10 MG TABS Take 10 mg by mouth daily.     Lancets (ONETOUCH DELICA PLUS LANCET33G) MISC Apply topically 2 (two) times daily.     losartan (COZAAR) 50 MG tablet Take 50 mg by mouth 2 (two) times daily.     metFORMIN (GLUCOPHAGE-XR) 500 MG 24 hr tablet Take 1 tablet (500 mg total) by mouth every evening. Restart om 05/20/17 30 tablet 1   omeprazole (PRILOSEC) 20 MG capsule Take 1 capsule (20 mg total) by mouth daily. 30 capsule 3   ONETOUCH VERIO test strip in the morning and at bedtime.     tamoxifen (NOLVADEX) 20 MG tablet Take 1 tablet (20 mg total) by mouth daily. 90 tablet 3   zolpidem (AMBIEN) 5 MG tablet Take 1 tablet (5 mg total) by mouth at bedtime as needed for sleep. 30 tablet 1   No current facility-administered medications for this visit.    Medication reconciliation is based on the patient's most recent medication list in the electronic medical record (EMR) including herbal products and OTC medications.   The patient's medication list was reviewed today with the patient? Yes   Drug-drug interactions (DDIs) DDIs were evaluated? Yes Significant DDIs identified? No   Drug-Food Interactions Drug-food interactions were evaluated? Yes Drug-food interactions identified?  Avoid grapefruit products  Follow-up Plan  Continue abemaciclib 50 mg by mouth every 12 hours. [facility] should have refill on hand. He will be out on Friday per his report. Possibly go up on dose  in future Continue tamoxifen 20 mg by mouth daily Denosumab 120 mg SubQ every 12 weeks will start once approved by insurance. Dental clearance form given today to patient Monitor serum creatinine, calcium, sodium, hemoglobin Monitor fatigue Using zolpidem for sleep which he feels has helped. This was prescribed by Dr. Pamelia Hoit. He sees Dr. Nicholos Johns every 2 months per his report for diabetes, hypertension, and cholesterol management. Recommended patient keep BP logs and BG logs at home. Patient states these values are WNL at home. Asymptomatic today in clinic Will move Dr. Pamelia Hoit visit from 02/17/23 to 02/26/23 with labs since Mr. Spadafore was seen today and needs follow up every 2 weeks for now Will add labs, pharmacy clinic visit on 03/11/23  Oman Mosey Powless participated in the discussion, expressed understanding, and voiced agreement with the above plan. All questions were answered to her satisfaction. The patient was advised to contact the clinic at [phone] with any questions or concerns prior to her return visit.   I spent 60 minutes assessing the patient.  Mayur Duman A. Odetta Pink, PharmD, BCOP, CPP  Anselm Lis, RPH-CPP, 02/11/2023 4:53 PM  **Disclaimer: This note was dictated with voice recognition software. Similar sounding words can inadvertently be transcribed and this note may contain transcription errors which may not have been corrected upon publication of note.**

## 2023-02-12 ENCOUNTER — Other Ambulatory Visit: Payer: Self-pay

## 2023-02-12 LAB — CANCER ANTIGEN 27.29: CA 27.29: 1849.7 U/mL — ABNORMAL HIGH (ref 0.0–38.6)

## 2023-02-12 LAB — CANCER ANTIGEN 15-3: CA 15-3: 1883 U/mL — ABNORMAL HIGH (ref 0.0–25.0)

## 2023-02-13 ENCOUNTER — Other Ambulatory Visit (HOSPITAL_COMMUNITY): Payer: Self-pay

## 2023-02-16 ENCOUNTER — Telehealth: Payer: Self-pay | Admitting: Hematology and Oncology

## 2023-02-16 NOTE — Telephone Encounter (Signed)
 Scheduled appointments per 8/21 los. Patient is aware of the made appointments.

## 2023-02-17 ENCOUNTER — Ambulatory Visit: Payer: Medicare Other | Admitting: Hematology and Oncology

## 2023-02-20 ENCOUNTER — Telehealth: Payer: Self-pay

## 2023-02-20 NOTE — Telephone Encounter (Signed)
Pt LVM regarding a dental procedure that is to be done. Pt received a dental clearance form during his last visit on 02/11/2023. When pt turned in dental clearance form to the dental office, the dentist denied approval due to the pt being prescribed Denosumab. The pt states that he has not started taking the Denosumab and wanted to know what his next steps were.    This RN advised patient that the dentist works with the oncologist to make this decision and that he could schedule his dental procedure but delay starting the Denosumab until he is cleared to start it after the procedure is done.   Pt stated understanding and stated he will wait to start the Denosumab and will schedule his dental appointment when they have an opening available.  This RN instructed patient to call back if any more questions.

## 2023-02-25 ENCOUNTER — Other Ambulatory Visit: Payer: Self-pay | Admitting: *Deleted

## 2023-02-25 DIAGNOSIS — Z170 Estrogen receptor positive status [ER+]: Secondary | ICD-10-CM

## 2023-02-25 DIAGNOSIS — C7951 Secondary malignant neoplasm of bone: Secondary | ICD-10-CM

## 2023-02-26 ENCOUNTER — Inpatient Hospital Stay: Payer: Medicare Other | Attending: Hematology and Oncology | Admitting: Hematology and Oncology

## 2023-02-26 ENCOUNTER — Inpatient Hospital Stay: Payer: Medicare Other

## 2023-02-26 VITALS — BP 119/47 | HR 77 | Temp 97.8°F | Resp 18 | Ht 71.0 in | Wt 160.0 lb

## 2023-02-26 DIAGNOSIS — Z170 Estrogen receptor positive status [ER+]: Secondary | ICD-10-CM | POA: Diagnosis not present

## 2023-02-26 DIAGNOSIS — R197 Diarrhea, unspecified: Secondary | ICD-10-CM | POA: Insufficient documentation

## 2023-02-26 DIAGNOSIS — R5383 Other fatigue: Secondary | ICD-10-CM | POA: Diagnosis not present

## 2023-02-26 DIAGNOSIS — C50922 Malignant neoplasm of unspecified site of left male breast: Secondary | ICD-10-CM

## 2023-02-26 DIAGNOSIS — C50022 Malignant neoplasm of nipple and areola, left male breast: Secondary | ICD-10-CM | POA: Insufficient documentation

## 2023-02-26 DIAGNOSIS — C7951 Secondary malignant neoplasm of bone: Secondary | ICD-10-CM

## 2023-02-26 LAB — CBC WITH DIFFERENTIAL (CANCER CENTER ONLY)
Abs Immature Granulocytes: 0.01 10*3/uL (ref 0.00–0.07)
Basophils Absolute: 0 10*3/uL (ref 0.0–0.1)
Basophils Relative: 1 %
Eosinophils Absolute: 0 10*3/uL (ref 0.0–0.5)
Eosinophils Relative: 1 %
HCT: 31.7 % — ABNORMAL LOW (ref 39.0–52.0)
Hemoglobin: 10.7 g/dL — ABNORMAL LOW (ref 13.0–17.0)
Immature Granulocytes: 0 %
Lymphocytes Relative: 34 %
Lymphs Abs: 1.5 10*3/uL (ref 0.7–4.0)
MCH: 32.8 pg (ref 26.0–34.0)
MCHC: 33.8 g/dL (ref 30.0–36.0)
MCV: 97.2 fL (ref 80.0–100.0)
Monocytes Absolute: 0.3 10*3/uL (ref 0.1–1.0)
Monocytes Relative: 7 %
Neutro Abs: 2.6 10*3/uL (ref 1.7–7.7)
Neutrophils Relative %: 57 %
Platelet Count: 156 10*3/uL (ref 150–400)
RBC: 3.26 MIL/uL — ABNORMAL LOW (ref 4.22–5.81)
RDW: 13.3 % (ref 11.5–15.5)
WBC Count: 4.5 10*3/uL (ref 4.0–10.5)
nRBC: 0 % (ref 0.0–0.2)

## 2023-02-26 LAB — CMP (CANCER CENTER ONLY)
ALT: 13 U/L (ref 0–44)
AST: 14 U/L — ABNORMAL LOW (ref 15–41)
Albumin: 3.9 g/dL (ref 3.5–5.0)
Alkaline Phosphatase: 87 U/L (ref 38–126)
Anion gap: 6 (ref 5–15)
BUN: 35 mg/dL — ABNORMAL HIGH (ref 8–23)
CO2: 25 mmol/L (ref 22–32)
Calcium: 8.7 mg/dL — ABNORMAL LOW (ref 8.9–10.3)
Chloride: 103 mmol/L (ref 98–111)
Creatinine: 2.36 mg/dL — ABNORMAL HIGH (ref 0.61–1.24)
GFR, Estimated: 26 mL/min — ABNORMAL LOW (ref >60)
Glucose, Bld: 118 mg/dL — ABNORMAL HIGH (ref 70–99)
Potassium: 5.1 mmol/L (ref 3.5–5.1)
Sodium: 134 mmol/L — ABNORMAL LOW (ref 135–145)
Total Bilirubin: 0.7 mg/dL (ref 0.3–1.2)
Total Protein: 6.2 g/dL — ABNORMAL LOW (ref 6.5–8.1)

## 2023-02-26 NOTE — Assessment & Plan Note (Signed)
12/09/2022: mammogram detected asymmetry/mass retroareolar left breast with left nipple retraction and skin thickening. Ultrasound: 3.9 cm irregular hypoechoic mass retroareolar left breast, 4 abnormal left axillary lymph nodes with cortical thickening, mild right gynecomastia. 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

## 2023-02-26 NOTE — Progress Notes (Signed)
Patient Care Team: Georgianne Fick, MD as PCP - General (Internal Medicine) Croitoru, Rachelle Hora, MD as PCP - Cardiology (Cardiology) Pershing Proud, RN as Oncology Nurse Navigator Donnelly Angelica, RN as Oncology Nurse Navigator Serena Croissant, MD as Consulting Physician (Hematology and Oncology)  DIAGNOSIS:  Encounter Diagnosis  Name Primary?   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 Ultrasound: 3.9 cm irregular hypoechoic mass retroareolar left breast, 4 abnormal left axillary lymph nodes with cortical thickening, mild right gynecomastia. Biopsy: Grade 2 IDC, LVI present, lymph node positive, ER 100%, PR 50%, Ki-67 30%, HER2 2+, FISH negative ratio 1.08    12/23/2022 Cancer Staging   Staging form: Breast, AJCC 8th Edition - Clinical: Stage IIA (cT2, cN1, cM0, G2, ER+, PR+, HER2-) - Signed by Serena Croissant, MD on 12/23/2022 Stage prefix: Initial diagnosis Histologic grading system: 3 grade system   12/30/2022 Genetic Testing   Negative [facility] Common Hereditary Cancers +RNA Panel.  Report date is 12/30/2022.    The [facility] Common Hereditary Cancers + RNA Panel includes sequencing, deletion/duplication, and RNA analysis of the following 48 genes: APC, ATM, AXIN2, BAP1, BARD1, BMPR1A, BRCA1, BRCA2, BRIP1, CDH1, CDK4*, CDKN2A*, CHEK2, CTNNA1, DICER1, EPCAM* (del/dup only), FH, GREM1* (promoter dup analysis only), HOXB13*, KIT*, MBD4*, MEN1, MLH1, MSH2, MSH3, MSH6, MUTYH, NF1, NTHL1, PALB2, PDGFRA*, PMS2, POLD1, POLE, PTEN, RAD51C, RAD51D, SDHA (sequencing only), SDHB, SDHC, SDHD, SMAD4, SMARCA4, STK11, TP53, TSC1, TSC2, VHL.  *Genes without RNA analysis.       CHIEF COMPLIANT: Tamoxifen with Verzenio   INTERVAL HISTORY: Jesse Hoffman is a 87 year old above mentioned history of left breast cancer. He presents to the clinic for a follow-up. Patient reports he tolerating treatment extremely well. He denis any diarrhea or nausea. His biggest complaint is fatigue and having trouble sleeping. Appetite has improved.   ALLERGIES:  is allergic to allopurinol.  MEDICATIONS:  Current Outpatient Medications  Medication Sig Dispense Refill   abemaciclib (VERZENIO) 50 MG tablet Take 1 tablet (50 mg total) by mouth 2 (two) times daily. 60 tablet 1   Alpha-Lipoic Acid 200 MG CAPS 1 capsule Orally Once a day for 30 day(s)     amLODipine (NORVASC) 5 MG tablet Take 5 mg by mouth daily.     atorvastatin (LIPITOR) 40 MG tablet Take 1 tablet (40 mg total) by mouth daily at 6 PM. Restart after 1 week, when Liver function tests normalize     calcium-vitamin D (OSCAL WITH D) 500-5 MG-MCG tablet Take 1 tablet by mouth daily. 30 tablet 5   FARXIGA 10 MG TABS tablet Take 10 mg by mouth daily.     KERENDIA 10 MG TABS Take 10 mg by mouth daily.     Lancets (ONETOUCH DELICA PLUS LANCET33G) MISC Apply topically 2 (two) times daily.     metFORMIN (GLUCOPHAGE-XR) 500 MG 24 hr tablet Take 1 tablet (500 mg total) by mouth every evening. Restart om 05/20/17 30 tablet 1   omeprazole (PRILOSEC) 20 MG capsule Take 1 capsule (20 mg total) by mouth daily. 30 capsule 3   ONETOUCH VERIO test strip in the morning and at bedtime.     tamoxifen (NOLVADEX) 20 MG tablet Take 1 tablet (20 mg total) by  mouth daily. 90 tablet 3   zolpidem (AMBIEN) 5 MG tablet Take 1 tablet (5 mg total) by mouth at bedtime as needed for sleep. 30 tablet 1   losartan (COZAAR) 50 MG tablet Take 50 mg by mouth 2 (two) times daily. (Patient not taking: Reported on 02/26/2023)     No current facility-administered medications for this visit.    PHYSICAL EXAMINATION: ECOG PERFORMANCE STATUS: 1 - Symptomatic but  completely ambulatory  Vitals:   02/26/23 1522  BP: (!) 119/47  Pulse: 77  Resp: 18  Temp: 97.8 F (36.6 C)  SpO2: 98%   Filed Weights   02/26/23 1522  Weight: 160 lb (72.6 kg)     LABORATORY DATA:  I have reviewed the data as listed    Latest Ref Rng & Units 02/26/2023    2:54 PM 02/11/2023    3:16 PM 01/14/2023    2:21 PM  CMP  Glucose 70 - 99 mg/dL 161  096  045   BUN 8 - 23 mg/dL 35  34  24   Creatinine 0.61 - 1.24 mg/dL 4.09  8.11  9.14   Sodium 135 - 145 mmol/L 134  134  138   Potassium 3.5 - 5.1 mmol/L 5.1  4.8  4.5   Chloride 98 - 111 mmol/L 103  105  103   CO2 22 - 32 mmol/L 25  20  30    Calcium 8.9 - 10.3 mg/dL 8.7  8.3  9.8   Total Protein 6.5 - 8.1 g/dL 6.2  6.8  6.5   Total Bilirubin 0.3 - 1.2 mg/dL 0.7  0.8  1.4   Alkaline Phos 38 - 126 U/L 87  91  75   AST 15 - 41 U/L 14  19  20    ALT 0 - 44 U/L 13  20  17      Lab Results  Component Value Date   WBC 4.5 02/26/2023   HGB 10.7 (L) 02/26/2023   HCT 31.7 (L) 02/26/2023   MCV 97.2 02/26/2023   PLT 156 02/26/2023   NEUTROABS 2.6 02/26/2023    ASSESSMENT & PLAN:  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 Ultrasound: 3.9 cm irregular hypoechoic mass retroareolar left breast, 4 abnormal left axillary lymph nodes with cortical thickening, mild right gynecomastia. 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 Scans will be done at the end of October.    No orders of the defined types were placed in this encounter.  The patient has a good understanding of the overall plan. he agrees with it. he will call with any problems that may develop before the next visit here. Total time spent: 30 mins including face to face time and time spent for planning, charting and co-ordination of care   Tamsen Meek, MD 02/27/23    I Janan Ridge am acting as a Neurosurgeon for Dr.Josealfredo Adkins  I have reviewed the above documentation for accuracy  and completeness, and I agree with the above.

## 2023-02-27 ENCOUNTER — Encounter: Payer: Self-pay | Admitting: Hematology and Oncology

## 2023-03-03 ENCOUNTER — Other Ambulatory Visit (HOSPITAL_COMMUNITY): Payer: Self-pay

## 2023-03-05 ENCOUNTER — Other Ambulatory Visit: Payer: Self-pay | Admitting: Hematology and Oncology

## 2023-03-05 ENCOUNTER — Other Ambulatory Visit (HOSPITAL_COMMUNITY): Payer: Self-pay

## 2023-03-06 ENCOUNTER — Other Ambulatory Visit (HOSPITAL_COMMUNITY): Payer: Self-pay

## 2023-03-09 ENCOUNTER — Other Ambulatory Visit (HOSPITAL_COMMUNITY): Payer: Self-pay

## 2023-03-10 ENCOUNTER — Other Ambulatory Visit (HOSPITAL_COMMUNITY): Payer: Self-pay

## 2023-03-10 ENCOUNTER — Other Ambulatory Visit: Payer: Self-pay

## 2023-03-10 DIAGNOSIS — C50922 Malignant neoplasm of unspecified site of left male breast: Secondary | ICD-10-CM

## 2023-03-10 MED ORDER — ABEMACICLIB 50 MG PO TABS
50.0000 mg | ORAL_TABLET | Freq: Two times a day (BID) | ORAL | 1 refills | Status: DC
  Filled 2023-03-10: qty 56, 28d supply, fill #0
  Filled 2023-04-01: qty 56, 28d supply, fill #1

## 2023-03-11 ENCOUNTER — Other Ambulatory Visit (HOSPITAL_COMMUNITY): Payer: Self-pay

## 2023-03-11 ENCOUNTER — Inpatient Hospital Stay: Payer: Medicare Other | Admitting: Pharmacist

## 2023-03-11 ENCOUNTER — Inpatient Hospital Stay: Payer: Medicare Other

## 2023-03-11 VITALS — BP 134/61 | HR 117 | Temp 98.0°F | Resp 18 | Ht 71.0 in | Wt 159.1 lb

## 2023-03-11 DIAGNOSIS — Z170 Estrogen receptor positive status [ER+]: Secondary | ICD-10-CM | POA: Diagnosis not present

## 2023-03-11 DIAGNOSIS — C50922 Malignant neoplasm of unspecified site of left male breast: Secondary | ICD-10-CM

## 2023-03-11 DIAGNOSIS — C50022 Malignant neoplasm of nipple and areola, left male breast: Secondary | ICD-10-CM | POA: Diagnosis not present

## 2023-03-11 DIAGNOSIS — R197 Diarrhea, unspecified: Secondary | ICD-10-CM | POA: Diagnosis not present

## 2023-03-11 DIAGNOSIS — R5383 Other fatigue: Secondary | ICD-10-CM | POA: Diagnosis not present

## 2023-03-11 LAB — CBC WITH DIFFERENTIAL (CANCER CENTER ONLY)
Abs Immature Granulocytes: 0.01 10*3/uL (ref 0.00–0.07)
Basophils Absolute: 0.1 10*3/uL (ref 0.0–0.1)
Basophils Relative: 2 %
Eosinophils Absolute: 0.1 10*3/uL (ref 0.0–0.5)
Eosinophils Relative: 1 %
HCT: 32.6 % — ABNORMAL LOW (ref 39.0–52.0)
Hemoglobin: 11 g/dL — ABNORMAL LOW (ref 13.0–17.0)
Immature Granulocytes: 0 %
Lymphocytes Relative: 40 %
Lymphs Abs: 1.8 10*3/uL (ref 0.7–4.0)
MCH: 33.3 pg (ref 26.0–34.0)
MCHC: 33.7 g/dL (ref 30.0–36.0)
MCV: 98.8 fL (ref 80.0–100.0)
Monocytes Absolute: 0.4 10*3/uL (ref 0.1–1.0)
Monocytes Relative: 8 %
Neutro Abs: 2.2 10*3/uL (ref 1.7–7.7)
Neutrophils Relative %: 49 %
Platelet Count: 136 10*3/uL — ABNORMAL LOW (ref 150–400)
RBC: 3.3 MIL/uL — ABNORMAL LOW (ref 4.22–5.81)
RDW: 13.8 % (ref 11.5–15.5)
WBC Count: 4.5 10*3/uL (ref 4.0–10.5)
nRBC: 0 % (ref 0.0–0.2)

## 2023-03-11 LAB — CMP (CANCER CENTER ONLY)
ALT: 12 U/L (ref 0–44)
AST: 15 U/L (ref 15–41)
Albumin: 4 g/dL (ref 3.5–5.0)
Alkaline Phosphatase: 72 U/L (ref 38–126)
Anion gap: 6 (ref 5–15)
BUN: 36 mg/dL — ABNORMAL HIGH (ref 8–23)
CO2: 27 mmol/L (ref 22–32)
Calcium: 8.9 mg/dL (ref 8.9–10.3)
Chloride: 105 mmol/L (ref 98–111)
Creatinine: 2.2 mg/dL — ABNORMAL HIGH (ref 0.61–1.24)
GFR, Estimated: 29 mL/min — ABNORMAL LOW (ref >60)
Glucose, Bld: 108 mg/dL — ABNORMAL HIGH (ref 70–99)
Potassium: 4 mmol/L (ref 3.5–5.1)
Sodium: 138 mmol/L (ref 135–145)
Total Bilirubin: 0.7 mg/dL (ref 0.3–1.2)
Total Protein: 6.6 g/dL (ref 6.5–8.1)

## 2023-03-11 NOTE — Progress Notes (Signed)
[facility]       Telephone: [phone]?Fax: [phone]   Oncology Clinical Pharmacist Practitioner Progress Note  Jesse Hoffman was contacted via in-person to discuss his chemotherapy regimen for abemaciclib which they receive under the care of Dr. Serena Croissant.  Current treatment regimen and start date Abemaciclib (01/16/23) Tamoxifen (01/12/23) Denosumab 120 mg (TBD on hold until dental surgery complete)  Interval History He continues on abemaciclib 50 mg by mouth every 12 hours on days 1 to 28 of a 28-day cycle. This is being given in combination with tamoxifen. Denosumab 120 mg every 12 start is on hold until dental surgery complete . Therapy is planned to continue until disease progression or unacceptable toxicity. Dr. Pamelia Hoit stopped Jesse Hoffman's losartan at his last visit due to hypotension. BP improved today. Pulse on manual check was 61 bpm.  Response to Therapy Overall, Jesse Hoffman is doing well. Labs are mostly improved from his last visit with Dr. Pamelia Hoit (Hgb, Scr, calcium, sodium). Platelets are slightly decreased. Appetite has improved and he is drinking more fluids. He is still having some fatigue which may be from difficulty falling asleep. We did discuss limiting TV time in in the bed and trying instead to do some reading. He is also taking zolpidem as needed. He is resting throughout the day which he states helps. He is also exercising more.  He will see Dr. Pamelia Hoit in 2 weeks with labs and then clinical pharmacy in 6 weeks with labs. He will be seeing his dentist tomorrow about a tooth extraction. Dr. Pamelia Hoit likes to hold denosumab for 3 months after the procedure so denosumab start is on hold until further notice. We will add restaging scans for end of October and CA 27.29 standing labs. Labs, vitals, treatment parameters, and manufacturer guidelines assessing toxicity were reviewed with Jesse Hoffman today. Based on these values, patient is in  agreement to continue abemaciclib therapy at this time.  Allergies Allergies  Allergen Reactions   Allopurinol     hepatotoxicity    Vitals    02/26/2023    3:22 PM 02/11/2023    4:02 PM 01/12/2023    2:05 PM  Oncology Vitals  Height 180 cm 180 cm   Weight 72.576 kg 72.802 kg   Weight (lbs) 160 lbs 160 lbs 8 oz   BMI 22.32 kg/m2 22.39 kg/m2   Temp 97.8 F (36.6 C) 97.3 F (36.3 C)   Pulse Rate 77 113   BP 119/47 128/50 152/126  Resp 18 18   SpO2 98 % 100 %   BSA (m2) 1.91 m2 1.91 m2     Laboratory Data    Latest Ref Rng & Units 03/11/2023    3:21 PM 02/26/2023    2:54 PM 02/11/2023    3:16 PM  CBC EXTENDED  WBC 4.0 - 10.5 K/uL 4.5  4.5  4.2   RBC 4.22 - 5.81 MIL/uL 3.30  3.26  3.29   Hemoglobin 13.0 - 17.0 g/dL 40.1  02.7  25.3   HCT 39.0 - 52.0 % 32.6  31.7  32.2   Platelets 150 - 400 K/uL 136  156  132   NEUT# 1.7 - 7.7 K/uL 2.2  2.6  2.0   Lymph# 0.7 - 4.0 K/uL 1.8  1.5  1.9        Latest Ref Rng & Units 03/11/2023    3:21 PM 02/26/2023    2:54 PM 02/11/2023    3:16 PM  CMP  Glucose 70 - 99 mg/dL 295  188  416   BUN 8 - 23 mg/dL 36  35  34   Creatinine 0.61 - 1.24 mg/dL 6.06  3.01  6.01   Sodium 135 - 145 mmol/L 138  134  134   Potassium 3.5 - 5.1 mmol/L 4.0  5.1  4.8   Chloride 98 - 111 mmol/L 105  103  105   CO2 22 - 32 mmol/L 27  25  20    Calcium 8.9 - 10.3 mg/dL 8.9  8.7  8.3   Total Protein 6.5 - 8.1 g/dL 6.6  6.2  6.8   Total Bilirubin 0.3 - 1.2 mg/dL 0.7  0.7  0.8   Alkaline Phos 38 - 126 U/L 72  87  91   AST 15 - 41 U/L 15  14  19    ALT 0 - 44 U/L 12  13  20      No results found for: "MG" Lab Results  Component Value Date   CA2729 1,849.7 (H) 02/11/2023   UX3235 756.1 (H) 01/14/2023     Adverse Effects Assessment Scr: improved from last visit. Baseline value is 1.57 mg/dL on 5/73/22 Calcium: was low, now WNL Hgb: improved to 11 g/dL Platelets: decreased to 136 K/uL  Adherence Assessment Jesse Hoffman reports missing 0 doses over the  past 2 weeks.   Reason for missed dose: n/a Patient was re-educated on importance of adherence.   Access Assessment Jesse Hoffman is currently receiving his abemaciclib through [employer] concerns:  none  Medication Reconciliation The patient's medication list was reviewed today with the patient? Yes New medications or herbal supplements have recently been started?  Using guaifenesin PRN Any medications have been discontinued?  Yes, Dr. Pamelia Hoit d/c'ed losartan at last visit. Updated med list today to reflect change The medication list was updated and reconciled based on the patient's most recent medication list in the electronic medical record (EMR) including herbal products and OTC medications.   Medications Current Outpatient Medications  Medication Sig Dispense Refill   guaiFENesin (MUCINEX) 600 MG 12 hr tablet Take 1,200 mg by mouth 2 (two) times daily.     abemaciclib (VERZENIO) 50 MG tablet Take 1 tablet (50 mg total) by mouth 2 (two) times daily. 60 tablet 1   Alpha-Lipoic Acid 200 MG CAPS 1 capsule Orally Once a day for 30 day(s)     amLODipine (NORVASC) 5 MG tablet Take 5 mg by mouth daily.     atorvastatin (LIPITOR) 40 MG tablet Take 1 tablet (40 mg total) by mouth daily at 6 PM. Restart after 1 week, when Liver function tests normalize     calcium-vitamin D (OSCAL WITH D) 500-5 MG-MCG tablet Take 1 tablet by mouth daily. 30 tablet 5   FARXIGA 10 MG TABS tablet Take 10 mg by mouth daily.     KERENDIA 10 MG TABS Take 10 mg by mouth daily.     Lancets (ONETOUCH DELICA PLUS LANCET33G) MISC Apply topically 2 (two) times daily.     metFORMIN (GLUCOPHAGE-XR) 500 MG 24 hr tablet Take 1 tablet (500 mg total) by mouth every evening. Restart om 05/20/17 (Patient not taking: Reported on 03/11/2023) 30 tablet 1   omeprazole (PRILOSEC) 20 MG capsule Take 1 capsule (20 mg total) by mouth daily. 30 capsule 3   ONETOUCH VERIO test strip in the morning and at  bedtime.     tamoxifen (NOLVADEX) 20 MG tablet Take 1 tablet (  20 mg total) by mouth daily. 90 tablet 3   zolpidem (AMBIEN) 5 MG tablet Take 1 tablet (5 mg total) by mouth at bedtime as needed for sleep. 30 tablet 1   No current facility-administered medications for this visit.    Drug-Drug Interactions (DDIs) DDIs were evaluated? Yes Significant DDIs?  No , he will hold metformin since estimated CrCl is < 30 mL/min. Currently at 25.7 mL/min. The patient was instructed to speak with their health care provider and/or the oral chemotherapy pharmacist before starting any new drug, including prescription or over the counter, natural / herbal products, or vitamins.  Supportive Care Diarrhea: we reviewed that diarrhea is common with abemaciclib and confirmed that she does have loperamide (Imodium) at home.  We reviewed how to take this medication PRN. Neutropenia: we discussed the importance of having a thermometer and what the Centers for Disease Control and Prevention (CDC) considers a fever which is 100.109F (38C) or higher.  Gave patient 24/7 triage line to call if any fevers or symptoms. ILD/Pneumonitis: we reviewed potential symptoms including cough, shortness, and fatigue.  VTE: reviewed signs of DVT such as leg swelling, redness, pain, or tenderness and signs of PE such as shortness of breath, rapid or irregular heartbeat, cough, chest pain, or lightheadedness. Reviewed to take the medication every 12 hours (with food sometimes can be easier on the stomach) and to take it at the same time every day. Hepatotoxicity:WNL Drug interactions with grapefruit products Hold metformin until CrCl is 30 mL/min or better  Dosing Assessment Hepatic adjustments needed? No  Renal adjustments needed? No  Toxicity adjustments needed? No  The current dosing regimen is appropriate to continue at this time.  Follow-Up Plan Continue abemaciclib 50 mg by mouth every 12 hours. No dose increases at this  time Continue tamoxifen 20 mg by mouth daily Denosumab 120 mg SubQ every 12 weeks start date on hold. Patient having tooth extraction. Start 3 months after tooth extraction date Restaging scans ordered for end of October per Dr. Pamelia Hoit. CT CAP w/o contrast due to kidney function. Holding metformin due to kidney function. Should be avoided if CrCl < 30 mL/min. Currently estimated at 25.7 mL/min. Patent will start to hold tomorrow morning. Monitor serum creatinine, calcium, sodium, hemoglobin Monitor fatigue Using zolpidem PRN for sleep which he feels has helped. This was prescribed by Dr. Pamelia Hoit. He sees Dr. Nicholos Johns every 2 months per his report for diabetes, hypertension, and cholesterol management. Recommended patient keep BP logs and BG logs at home. Patient states these values are WNL at home. Asymptomatic today in clinic Will add labs, Dr. Pamelia Hoit visit,  for 03/25/23.  Will add labs, pharmacy clinic visit for 04/22/23   Jesse Hoffman participated in the discussion, expressed understanding, and voiced agreement with the above plan. All questions were answered to her satisfaction. The patient was advised to contact the clinic at [phone] with any questions or concerns prior to her return visit.   I spent 30 minutes assessing and educating the patient.  Tiyah Zelenak A. Odetta Pink, PharmD, BCOP, CPP  Jesse Hoffman, RPH-CPP, 03/11/2023  3:57 PM   **Disclaimer: This note was dictated with voice recognition software. Similar sounding words can inadvertently be transcribed and this note may contain transcription errors which may not have been corrected upon publication of note.**

## 2023-03-18 ENCOUNTER — Inpatient Hospital Stay: Payer: Medicare Other | Admitting: Pharmacist

## 2023-03-18 DIAGNOSIS — Z170 Estrogen receptor positive status [ER+]: Secondary | ICD-10-CM

## 2023-03-18 NOTE — Progress Notes (Signed)
[facility]       Telephone: [phone]?Fax: [phone]   Oncology Clinical Pharmacist Practitioner Progress Note  Jesse Hoffman is a 87 y.o. male with a diagnosis of metastatic breast cancer currently on abemaciclib + tamoxifen under the care of Dr. Serena Croissant.   I connected with Gerilyn Nestle today by telephone and verified that I was speaking with the correct person using two patient identifiers. I discussed the limitations, risks, security and privacy concerns of performing an evaluation and management service by telemedicine and the availability of in-person appointments. The patient/caregiver expressed understanding and agreed to proceed.  Other persons participating in the visit and their role in the encounter: none   Patient's location: home  Provider's location: clinic  Clinical pharmacy received a message from Dr. Earmon Phoenix nurse that Mr. Avans was calling to inquire about continuing to hold metformin.  We called him back today and again discussed that metformin is not recommended when creatinine clearance is less than 30 mL/min.  The plan was for him to hold metformin and increase his fluid intake until he sees Dr. Pamelia Hoit next on 03/25/23 with labs.  The patient did explain that his fasting blood sugars and his evening blood sugars have been up about 30 points since stopping metformin.  We also went over some diabetic dietary recommendations.  Mr. Medendorp believes he may have labs on 04/22/23 through Dr. Carolyn Stare office and then see the physician a week later. Dr. Nicholos Johns is managing his diabetes. We will forward our note from today to their office. Nothing appears scheduled currently but we did discuss the creatinine clearance (CrCl) threshold of 30 mL/min or greater where it would be reasonable to restart metformin. Metformin is contraindicated when CrCl is < 30 mL/min and it was estimated at 25.5 mL/min at his last visit. It was held out of an  abundance of caution.   He will see Dr. Pamelia Hoit with labs on 03/25/23 and potentially restart metformin then. He has restaging scans scheduled for 04/17/23. He will likely have a telephone visit with Dr. Pamelia Hoit to review labs a few days after.He will see clinical pharmacy again on 04/22/23 with labs.  Orvan Wilmott Pothier participated in the discussion, expressed understanding, and voiced agreement with the above plan. All questions were answered to her satisfaction. The patient was advised to contact the clinic at [phone] with any questions or concerns prior to her return visit. Clinical pharmacy will continue to support Gerilyn Nestle and Dr. Serena Croissant as needed.  Myldred Raju A. Odetta Pink, PharmD, BCOP, CPP  Anselm Lis, RPH-CPP,  03/18/2023  1:21 PM   **Disclaimer: This note was dictated with voice recognition software. Similar sounding words can inadvertently be transcribed and this note may contain transcription errors which may not have been corrected upon publication of note.**

## 2023-03-25 ENCOUNTER — Inpatient Hospital Stay: Payer: Medicare Other | Attending: Hematology and Oncology

## 2023-03-25 ENCOUNTER — Inpatient Hospital Stay: Payer: Medicare Other | Admitting: Hematology and Oncology

## 2023-03-25 VITALS — BP 143/58 | HR 69 | Temp 97.5°F | Resp 18 | Ht 71.0 in | Wt 158.9 lb

## 2023-03-25 DIAGNOSIS — E1122 Type 2 diabetes mellitus with diabetic chronic kidney disease: Secondary | ICD-10-CM | POA: Insufficient documentation

## 2023-03-25 DIAGNOSIS — Z170 Estrogen receptor positive status [ER+]: Secondary | ICD-10-CM | POA: Insufficient documentation

## 2023-03-25 DIAGNOSIS — C50922 Malignant neoplasm of unspecified site of left male breast: Secondary | ICD-10-CM | POA: Diagnosis not present

## 2023-03-25 DIAGNOSIS — I129 Hypertensive chronic kidney disease with stage 1 through stage 4 chronic kidney disease, or unspecified chronic kidney disease: Secondary | ICD-10-CM | POA: Insufficient documentation

## 2023-03-25 DIAGNOSIS — C50022 Malignant neoplasm of nipple and areola, left male breast: Secondary | ICD-10-CM | POA: Diagnosis not present

## 2023-03-25 DIAGNOSIS — N189 Chronic kidney disease, unspecified: Secondary | ICD-10-CM | POA: Insufficient documentation

## 2023-03-25 DIAGNOSIS — E785 Hyperlipidemia, unspecified: Secondary | ICD-10-CM | POA: Diagnosis not present

## 2023-03-25 DIAGNOSIS — Z7984 Long term (current) use of oral hypoglycemic drugs: Secondary | ICD-10-CM | POA: Diagnosis not present

## 2023-03-25 DIAGNOSIS — Z79899 Other long term (current) drug therapy: Secondary | ICD-10-CM | POA: Diagnosis not present

## 2023-03-25 DIAGNOSIS — Z79810 Long term (current) use of selective estrogen receptor modulators (SERMs): Secondary | ICD-10-CM | POA: Diagnosis not present

## 2023-03-25 LAB — CBC WITH DIFFERENTIAL (CANCER CENTER ONLY)
Abs Immature Granulocytes: 0.02 10*3/uL (ref 0.00–0.07)
Basophils Absolute: 0 10*3/uL (ref 0.0–0.1)
Basophils Relative: 0 %
Eosinophils Absolute: 0 10*3/uL (ref 0.0–0.5)
Eosinophils Relative: 1 %
HCT: 33.1 % — ABNORMAL LOW (ref 39.0–52.0)
Hemoglobin: 11.2 g/dL — ABNORMAL LOW (ref 13.0–17.0)
Immature Granulocytes: 0 %
Lymphocytes Relative: 23 %
Lymphs Abs: 1.2 10*3/uL (ref 0.7–4.0)
MCH: 33.4 pg (ref 26.0–34.0)
MCHC: 33.8 g/dL (ref 30.0–36.0)
MCV: 98.8 fL (ref 80.0–100.0)
Monocytes Absolute: 0.3 10*3/uL (ref 0.1–1.0)
Monocytes Relative: 6 %
Neutro Abs: 3.9 10*3/uL (ref 1.7–7.7)
Neutrophils Relative %: 70 %
Platelet Count: 155 10*3/uL (ref 150–400)
RBC: 3.35 MIL/uL — ABNORMAL LOW (ref 4.22–5.81)
RDW: 14.2 % (ref 11.5–15.5)
WBC Count: 5.5 10*3/uL (ref 4.0–10.5)
nRBC: 0 % (ref 0.0–0.2)

## 2023-03-25 LAB — CMP (CANCER CENTER ONLY)
ALT: 26 U/L (ref 0–44)
AST: 23 U/L (ref 15–41)
Albumin: 4 g/dL (ref 3.5–5.0)
Alkaline Phosphatase: 65 U/L (ref 38–126)
Anion gap: 6 (ref 5–15)
BUN: 24 mg/dL — ABNORMAL HIGH (ref 8–23)
CO2: 25 mmol/L (ref 22–32)
Calcium: 9 mg/dL (ref 8.9–10.3)
Chloride: 107 mmol/L (ref 98–111)
Creatinine: 1.95 mg/dL — ABNORMAL HIGH (ref 0.61–1.24)
GFR, Estimated: 33 mL/min — ABNORMAL LOW (ref >60)
Glucose, Bld: 210 mg/dL — ABNORMAL HIGH (ref 70–99)
Potassium: 3.9 mmol/L (ref 3.5–5.1)
Sodium: 138 mmol/L (ref 135–145)
Total Bilirubin: 0.8 mg/dL (ref 0.3–1.2)
Total Protein: 6.6 g/dL (ref 6.5–8.1)

## 2023-03-25 NOTE — Progress Notes (Signed)
Patient Care Team: Georgianne Fick, MD as PCP - General (Internal Medicine) Thurmon Fair, MD as PCP - Cardiology (Cardiology) Pershing Proud, RN as Oncology Nurse Navigator Donnelly Angelica, RN as Oncology Nurse Navigator Serena Croissant, MD as Consulting Physician (Hematology and Oncology) Anselm Lis, RPH-CPP as Pharmacist (Hematology and Oncology)  DIAGNOSIS:  Encounter Diagnosis  Name Primary?   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 Ultrasound: 3.9 cm irregular hypoechoic mass retroareolar left breast, 4 abnormal left axillary lymph nodes with cortical thickening, mild right gynecomastia. Biopsy: Grade 2 IDC, LVI present, lymph node positive, ER 100%, PR 50%, Ki-67 30%, HER2 2+, FISH negative ratio 1.08    12/23/2022 Cancer Staging   Staging form: Breast, AJCC 8th Edition - Clinical: Stage IIA (cT2, cN1, cM0, G2, ER+, PR+, HER2-) - Signed by Serena Croissant, MD on 12/23/2022 Stage prefix: Initial diagnosis Histologic grading system: 3 grade system   12/30/2022 Genetic Testing   Negative [facility] Common Hereditary Cancers +RNA Panel.  Report date is 12/30/2022.    The [employer] Common Hereditary Cancers + RNA Panel includes sequencing, deletion/duplication, and RNA analysis of the following 48 genes: APC, ATM, AXIN2, BAP1, BARD1, BMPR1A, BRCA1, BRCA2, BRIP1, CDH1, CDK4*, CDKN2A*, CHEK2, CTNNA1, DICER1, EPCAM* (del/dup only), FH, GREM1* (promoter dup analysis only), HOXB13*, KIT*, MBD4*, MEN1, MLH1, MSH2, MSH3, MSH6, MUTYH, NF1, NTHL1, PALB2, PDGFRA*, PMS2, POLD1, POLE, PTEN, RAD51C, RAD51D, SDHA (sequencing only), SDHB, SDHC, SDHD, SMAD4, SMARCA4,  STK11, TP53, TSC1, TSC2, VHL.  *Genes without RNA analysis.      CHIEF COMPLIANT: Follow-up on [name] with tamoxifen  History of Present Illness   The patient, with a history of metastatic breast cancer, presents for a follow-up visit. He reports tolerating their current cancer treatment regimen of Verzenio and tamoxifen well, with no reported side effects such as nausea or diarrhea. The patient also mentions a recent tooth extraction, which he believes was causing confusion.  The patient has also been dealing with kidney issues, which led to the discontinuation of metformin. He reports that their blood sugar levels have been running high since stopping the medication. Additionally, he stopped taking losartan due to low blood pressure, but now reports that their blood pressure is running a bit high. The patient also mentions fatigue as a significant symptom but reports that their sleep has improved.  The patient also takes atorvastatin, which he continued despite initial concerns about its impact on their kidneys. He reports some confusion about their medication regimen, particularly regarding the dosing of losartan.         ALLERGIES:  is allergic to allopurinol.  MEDICATIONS:  Current Outpatient Medications  Medication Sig Dispense Refill   abemaciclib (VERZENIO) 50 MG tablet Take 1 tablet (50 mg total) by mouth 2 (two) times daily. 60 tablet 1   amLODipine (NORVASC) 5 MG tablet Take 5 mg by mouth daily.     atorvastatin (LIPITOR) 40 MG tablet Take 1 tablet (40 mg total) by mouth daily at 6 PM. Restart after 1 week, when Liver function tests normalize     calcium-vitamin D (OSCAL WITH D) 500-5 MG-MCG tablet Take 1 tablet by mouth daily. 30 tablet 5   FARXIGA 10 MG TABS tablet Take  10 mg by mouth daily.     guaiFENesin (MUCINEX) 600 MG 12 hr tablet Take 1,200 mg by mouth 2 (two) times daily.     KERENDIA 10 MG TABS Take 10 mg by mouth daily.     Lancets (ONETOUCH DELICA PLUS  LANCET33G) MISC Apply topically 2 (two) times daily.     losartan (COZAAR) 50 MG tablet Take 50 mg by mouth 2 (two) times daily.     omeprazole (PRILOSEC) 20 MG capsule Take 1 capsule (20 mg total) by mouth daily. 30 capsule 3   ONETOUCH VERIO test strip in the morning and at bedtime.     tamoxifen (NOLVADEX) 20 MG tablet Take 1 tablet (20 mg total) by mouth daily. 90 tablet 3   zolpidem (AMBIEN) 5 MG tablet Take 1 tablet (5 mg total) by mouth at bedtime as needed for sleep. 30 tablet 1   No current facility-administered medications for this visit.    PHYSICAL EXAMINATION: ECOG PERFORMANCE STATUS: 1 - Symptomatic but completely ambulatory  Vitals:   03/25/23 1131  BP: (!) 143/58  Pulse: 69  Resp: 18  Temp: (!) 97.5 F (36.4 C)  SpO2: 98%   Filed Weights   03/25/23 1131  Weight: 158 lb 14.4 oz (72.1 kg)    LABORATORY DATA:  I have reviewed the data as listed    Latest Ref Rng & Units 03/25/2023   11:01 AM 03/11/2023    3:21 PM 02/26/2023    2:54 PM  CMP  Glucose 70 - 99 mg/dL 086  578  469   BUN 8 - 23 mg/dL 24  36  35   Creatinine 0.61 - 1.24 mg/dL 6.29  5.28  4.13   Sodium 135 - 145 mmol/L 138  138  134   Potassium 3.5 - 5.1 mmol/L 3.9  4.0  5.1   Chloride 98 - 111 mmol/L 107  105  103   CO2 22 - 32 mmol/L 25  27  25    Calcium 8.9 - 10.3 mg/dL 9.0  8.9  8.7   Total Protein 6.5 - 8.1 g/dL 6.6  6.6  6.2   Total Bilirubin 0.3 - 1.2 mg/dL 0.8  0.7  0.7   Alkaline Phos 38 - 126 U/L 65  72  87   AST 15 - 41 U/L 23  15  14    ALT 0 - 44 U/L 26  12  13      Lab Results  Component Value Date   WBC 5.5 03/25/2023   HGB 11.2 (L) 03/25/2023   HCT 33.1 (L) 03/25/2023   MCV 98.8 03/25/2023   PLT 155 03/25/2023   NEUTROABS 3.9 03/25/2023    ASSESSMENT & PLAN:  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 Ultrasound: 3.9 cm  irregular hypoechoic mass retroareolar left breast, 4 abnormal left axillary lymph nodes with cortical thickening, mild right gynecomastia. 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 Scans will  be done at the end of October.  -Schedule CT scan on 04/17/2023 without contrast to assess treatment response.  Chronic Kidney Disease Improved kidney function (creatinine 1.95 from 2.36) after discontinuation of Metformin and reduction of Losartan due to previous low blood pressure. -Continue to hold Metformin. -Resume Losartan once daily. -Contact primary care provider to discuss alternative options for diabetes management due to elevated blood sugars.  Hyperlipidemia Continues Atorvastatin without issues.    Hypertension Blood pressure slightly elevated after discontinuation of Losartan. -Resume Losartan once daily.  Diabetes Mellitus Elevated blood sugars after discontinuation of Metformin due to kidney function. -Contact primary care provider to discuss alternative options for diabetes management.  Follow-up Review results of CT scan on next visit.          No orders of the defined types were placed in this encounter.  The patient has a good understanding of the overall plan. he agrees with it. he will call with any problems that may develop before the next visit here. Total time spent: 30 mins including face to face time and time spent for  planning, charting and co-ordination of care   Tamsen Meek, MD 03/25/23

## 2023-03-25 NOTE — Assessment & Plan Note (Signed)
12/09/2022: mammogram detected asymmetry/mass retroareolar left breast with left nipple retraction and skin thickening. Ultrasound: 3.9 cm irregular hypoechoic mass retroareolar left breast, 4 abnormal left axillary lymph nodes with cortical thickening, mild right gynecomastia. 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 Scans will be done at the end of October.

## 2023-03-26 LAB — CANCER ANTIGEN 27.29: CA 27.29: 1102.7 U/mL — ABNORMAL HIGH (ref 0.0–38.6)

## 2023-03-30 DIAGNOSIS — E782 Mixed hyperlipidemia: Secondary | ICD-10-CM | POA: Diagnosis not present

## 2023-03-30 DIAGNOSIS — R5383 Other fatigue: Secondary | ICD-10-CM | POA: Diagnosis not present

## 2023-03-30 DIAGNOSIS — I10 Essential (primary) hypertension: Secondary | ICD-10-CM | POA: Diagnosis not present

## 2023-03-30 DIAGNOSIS — E113293 Type 2 diabetes mellitus with mild nonproliferative diabetic retinopathy without macular edema, bilateral: Secondary | ICD-10-CM | POA: Diagnosis not present

## 2023-03-30 DIAGNOSIS — R011 Cardiac murmur, unspecified: Secondary | ICD-10-CM | POA: Diagnosis not present

## 2023-03-30 DIAGNOSIS — E1165 Type 2 diabetes mellitus with hyperglycemia: Secondary | ICD-10-CM | POA: Diagnosis not present

## 2023-03-30 DIAGNOSIS — E1122 Type 2 diabetes mellitus with diabetic chronic kidney disease: Secondary | ICD-10-CM | POA: Diagnosis not present

## 2023-03-30 DIAGNOSIS — M1A0790 Idiopathic chronic gout, unspecified ankle and foot, without tophus (tophi): Secondary | ICD-10-CM | POA: Diagnosis not present

## 2023-03-30 DIAGNOSIS — N1831 Chronic kidney disease, stage 3a: Secondary | ICD-10-CM | POA: Diagnosis not present

## 2023-03-30 DIAGNOSIS — Z23 Encounter for immunization: Secondary | ICD-10-CM | POA: Diagnosis not present

## 2023-03-30 DIAGNOSIS — E1121 Type 2 diabetes mellitus with diabetic nephropathy: Secondary | ICD-10-CM | POA: Diagnosis not present

## 2023-04-01 ENCOUNTER — Other Ambulatory Visit: Payer: Self-pay

## 2023-04-01 NOTE — Progress Notes (Signed)
Specialty Pharmacy Refill Coordination Note  Jesse Hoffman is a 87 y.o. male contacted today regarding refills of specialty medication(s) Abemaciclib   Patient requested Jesse Hoffman at [facility] at [place] date: 04/07/23   Medication will be filled on 04/06/23.

## 2023-04-06 DIAGNOSIS — Z170 Estrogen receptor positive status [ER+]: Secondary | ICD-10-CM | POA: Diagnosis not present

## 2023-04-06 DIAGNOSIS — E1121 Type 2 diabetes mellitus with diabetic nephropathy: Secondary | ICD-10-CM | POA: Diagnosis not present

## 2023-04-06 DIAGNOSIS — M1A0790 Idiopathic chronic gout, unspecified ankle and foot, without tophus (tophi): Secondary | ICD-10-CM | POA: Diagnosis not present

## 2023-04-06 DIAGNOSIS — Z0000 Encounter for general adult medical examination without abnormal findings: Secondary | ICD-10-CM | POA: Diagnosis not present

## 2023-04-06 DIAGNOSIS — C50922 Malignant neoplasm of unspecified site of left male breast: Secondary | ICD-10-CM | POA: Diagnosis not present

## 2023-04-06 DIAGNOSIS — E782 Mixed hyperlipidemia: Secondary | ICD-10-CM | POA: Diagnosis not present

## 2023-04-06 DIAGNOSIS — R5383 Other fatigue: Secondary | ICD-10-CM | POA: Diagnosis not present

## 2023-04-06 DIAGNOSIS — C799 Secondary malignant neoplasm of unspecified site: Secondary | ICD-10-CM | POA: Diagnosis not present

## 2023-04-06 DIAGNOSIS — N1831 Chronic kidney disease, stage 3a: Secondary | ICD-10-CM | POA: Diagnosis not present

## 2023-04-06 DIAGNOSIS — I10 Essential (primary) hypertension: Secondary | ICD-10-CM | POA: Diagnosis not present

## 2023-04-06 DIAGNOSIS — R011 Cardiac murmur, unspecified: Secondary | ICD-10-CM | POA: Diagnosis not present

## 2023-04-06 DIAGNOSIS — E1122 Type 2 diabetes mellitus with diabetic chronic kidney disease: Secondary | ICD-10-CM | POA: Diagnosis not present

## 2023-04-07 ENCOUNTER — Other Ambulatory Visit (HOSPITAL_COMMUNITY): Payer: Self-pay

## 2023-04-09 ENCOUNTER — Encounter (HOSPITAL_COMMUNITY): Payer: Self-pay | Admitting: Cardiovascular Disease

## 2023-04-14 ENCOUNTER — Telehealth (HOSPITAL_COMMUNITY): Payer: Self-pay | Admitting: Cardiovascular Disease

## 2023-04-14 NOTE — Telephone Encounter (Signed)
Just an FYI. We have made several attempts to contact this patient including sending a letter to schedule or reschedule their echocardiogram. We will be removing the patient from the echo WQ.  04/09/23 MY CHART LETTER SENT/LBW  04/07/23 [facility] to schedule @ 9:08 x 3 /LBW  03/31/23 [facility] to schedule x 2 for Nov @ 10:10/LBW  03/24/23 [facility] to schedule for Nov @ 9:17/LBW         Thank you

## 2023-04-17 ENCOUNTER — Ambulatory Visit (HOSPITAL_COMMUNITY)
Admission: RE | Admit: 2023-04-17 | Discharge: 2023-04-17 | Disposition: A | Discharge status: Home / Self Care | Payer: Medicare Other | Source: Ambulatory Visit | Attending: Hematology and Oncology

## 2023-04-17 DIAGNOSIS — C7951 Secondary malignant neoplasm of bone: Secondary | ICD-10-CM | POA: Diagnosis not present

## 2023-04-17 DIAGNOSIS — I700 Atherosclerosis of aorta: Secondary | ICD-10-CM | POA: Diagnosis not present

## 2023-04-17 DIAGNOSIS — C50922 Malignant neoplasm of unspecified site of left male breast: Secondary | ICD-10-CM | POA: Diagnosis not present

## 2023-04-17 DIAGNOSIS — Z170 Estrogen receptor positive status [ER+]: Secondary | ICD-10-CM | POA: Insufficient documentation

## 2023-04-21 ENCOUNTER — Other Ambulatory Visit: Payer: Self-pay

## 2023-04-22 ENCOUNTER — Inpatient Hospital Stay: Payer: Medicare Other | Admitting: Pharmacist

## 2023-04-22 ENCOUNTER — Inpatient Hospital Stay: Payer: Medicare Other

## 2023-04-22 ENCOUNTER — Other Ambulatory Visit: Payer: Self-pay

## 2023-04-22 VITALS — BP 132/65 | HR 77 | Temp 97.3°F | Resp 18 | Wt 164.1 lb

## 2023-04-22 DIAGNOSIS — E1122 Type 2 diabetes mellitus with diabetic chronic kidney disease: Secondary | ICD-10-CM | POA: Diagnosis not present

## 2023-04-22 DIAGNOSIS — Z170 Estrogen receptor positive status [ER+]: Secondary | ICD-10-CM | POA: Diagnosis not present

## 2023-04-22 DIAGNOSIS — Z79899 Other long term (current) drug therapy: Secondary | ICD-10-CM | POA: Diagnosis not present

## 2023-04-22 DIAGNOSIS — I129 Hypertensive chronic kidney disease with stage 1 through stage 4 chronic kidney disease, or unspecified chronic kidney disease: Secondary | ICD-10-CM | POA: Diagnosis not present

## 2023-04-22 DIAGNOSIS — Z7984 Long term (current) use of oral hypoglycemic drugs: Secondary | ICD-10-CM | POA: Diagnosis not present

## 2023-04-22 DIAGNOSIS — C50922 Malignant neoplasm of unspecified site of left male breast: Secondary | ICD-10-CM

## 2023-04-22 DIAGNOSIS — N189 Chronic kidney disease, unspecified: Secondary | ICD-10-CM | POA: Diagnosis not present

## 2023-04-22 DIAGNOSIS — E785 Hyperlipidemia, unspecified: Secondary | ICD-10-CM | POA: Diagnosis not present

## 2023-04-22 DIAGNOSIS — C50022 Malignant neoplasm of nipple and areola, left male breast: Secondary | ICD-10-CM | POA: Diagnosis not present

## 2023-04-22 LAB — CBC WITH DIFFERENTIAL (CANCER CENTER ONLY)
Abs Immature Granulocytes: 0.01 10*3/uL (ref 0.00–0.07)
Basophils Absolute: 0.1 10*3/uL (ref 0.0–0.1)
Basophils Relative: 1 %
Eosinophils Absolute: 0.2 10*3/uL (ref 0.0–0.5)
Eosinophils Relative: 3 %
HCT: 32.3 % — ABNORMAL LOW (ref 39.0–52.0)
Hemoglobin: 11.1 g/dL — ABNORMAL LOW (ref 13.0–17.0)
Immature Granulocytes: 0 %
Lymphocytes Relative: 35 %
Lymphs Abs: 1.7 10*3/uL (ref 0.7–4.0)
MCH: 34.5 pg — ABNORMAL HIGH (ref 26.0–34.0)
MCHC: 34.4 g/dL (ref 30.0–36.0)
MCV: 100.3 fL — ABNORMAL HIGH (ref 80.0–100.0)
Monocytes Absolute: 0.5 10*3/uL (ref 0.1–1.0)
Monocytes Relative: 10 %
Neutro Abs: 2.5 10*3/uL (ref 1.7–7.7)
Neutrophils Relative %: 51 %
Platelet Count: 131 10*3/uL — ABNORMAL LOW (ref 150–400)
RBC: 3.22 MIL/uL — ABNORMAL LOW (ref 4.22–5.81)
RDW: 13.4 % (ref 11.5–15.5)
WBC Count: 4.9 10*3/uL (ref 4.0–10.5)
nRBC: 0 % (ref 0.0–0.2)

## 2023-04-22 LAB — CMP (CANCER CENTER ONLY)
ALT: 11 U/L (ref 0–44)
AST: 14 U/L — ABNORMAL LOW (ref 15–41)
Albumin: 4 g/dL (ref 3.5–5.0)
Alkaline Phosphatase: 59 U/L (ref 38–126)
Anion gap: 5 (ref 5–15)
BUN: 27 mg/dL — ABNORMAL HIGH (ref 8–23)
CO2: 27 mmol/L (ref 22–32)
Calcium: 8.8 mg/dL — ABNORMAL LOW (ref 8.9–10.3)
Chloride: 107 mmol/L (ref 98–111)
Creatinine: 1.89 mg/dL — ABNORMAL HIGH (ref 0.61–1.24)
GFR, Estimated: 34 mL/min — ABNORMAL LOW (ref >60)
Glucose, Bld: 89 mg/dL (ref 70–99)
Potassium: 4.6 mmol/L (ref 3.5–5.1)
Sodium: 139 mmol/L (ref 135–145)
Total Bilirubin: 0.7 mg/dL (ref 0.3–1.2)
Total Protein: 6.7 g/dL (ref 6.5–8.1)

## 2023-04-22 NOTE — Progress Notes (Addendum)
Jesse Hoffman       Telephone: [phone]?Fax: [phone]   Oncology Clinical Pharmacist Practitioner Progress Note  Jesse Hoffman was contacted via in-person to discuss his chemotherapy regimen for abemaciclib which they receive under the care of Dr. Serena Croissant. Today he presents to clinic with his wife.    Current treatment regimen and start date Abemaciclib (01/16/23) Tamoxifen (01/12/23) Denosumab 120 mg (has been on hold with recent tooth extraction)  Interval History He continues on abemaciclib 50 mg by mouth every 12 hours on days 1 to 28 of a 28-day cycle. This is being given in combination with tamoxifen. Therapy is planned to continue until disease progression or unacceptable toxicity. Jesse Hoffman is now taking losartan once daily (previously taking twice daily). He has been started on Humulin 4 units daily since stopping metformin for his reduced renal function. This is being managed by Dr. Nicholos Johns.   Response to Therapy Overall, Jesse Hoffman is doing well. Labs, vitals, treatment parameters, and manufacturer guidelines assessing toxicity were reviewed with Jesse Hoffman today. His renal function continues to improve. His calcium, Hgb, and plts have slightly decreased since his last visit. He reports that he still continues to have fatigue as well as some aches and pains in his knees and hips, but this is unchanged from his baseline. He has not had any diarrhea. His appetite has improved and he has gained over 5 lbs since his last visit 4 weeks ago. Based on these values, patient is in agreement to continue abemaciclib therapy at this time.  CT from 04/17/23 shows stable disease as well as interval improvement. Discussed findings with Jesse Hoffman. Dr. Pamelia Hoit is aware of this. CA 27.29 test results still in process and will likely result tomorrow to be evaluated by Dr. Pamelia Hoit. Previously his CA 27.29 had trended down from 1849.7 (8/21) to 1102.7 (10/2). He  reports getting his tooth extraction on 03/25/23. He therefore will receive his first dose of denosumab in early January. He will see Dr. Pamelia Hoit in 4 weeks with labs and then clinical pharmacy in 8 weeks with labs.    Allergies Allergies  Allergen Reactions   Allopurinol     hepatotoxicity    Vitals    04/22/2023    2:03 PM 03/25/2023   11:31 AM 03/11/2023    4:12 PM  Oncology Vitals  Height  180 cm 180 cm  Weight 74.435 kg 72.077 kg 72.167 kg  Weight (lbs) 164 lbs 2 oz 158 lbs 14 oz 159 lbs 2 oz  BMI 22.89 kg/m2 22.16 kg/m2 22.19 kg/m2  Temp 97.3 F (36.3 C) 97.5 F (36.4 C) 98 F (36.7 C)  Pulse Rate 77 69 117  BP 132/65 143/58 134/61  Resp 18 18 18   SpO2 100 % 98 % 100 %  BSA (m2) 1.93 m2 1.9 m2 1.9 m2    Laboratory Data    Latest Ref Rng & Units 04/22/2023    1:15 PM 03/25/2023   11:01 AM 03/11/2023    3:21 PM  CBC EXTENDED  WBC 4.0 - 10.5 K/uL 4.9  5.5  4.5   RBC 4.22 - 5.81 MIL/uL 3.22  3.35  3.30   Hemoglobin 13.0 - 17.0 g/dL 29.5  62.1  30.8   HCT 39.0 - 52.0 % 32.3  33.1  32.6   Platelets 150 - 400 K/uL 131  155  136   NEUT# 1.7 - 7.7 K/uL 2.5  3.9  2.2   Lymph# 0.7 -  4.0 K/uL 1.7  1.2  1.8        Latest Ref Rng & Units 04/22/2023    1:15 PM 03/25/2023   11:01 AM 03/11/2023    3:21 PM  CMP  Glucose 70 - 99 mg/dL 89  811  914   BUN 8 - 23 mg/dL 27  24  36   Creatinine 0.61 - 1.24 mg/dL 7.82  9.56  2.13   Sodium 135 - 145 mmol/L 139  138  138   Potassium 3.5 - 5.1 mmol/L 4.6  3.9  4.0   Chloride 98 - 111 mmol/L 107  107  105   CO2 22 - 32 mmol/L 27  25  27    Calcium 8.9 - 10.3 mg/dL 8.8  9.0  8.9   Total Protein 6.5 - 8.1 g/dL 6.7  6.6  6.6   Total Bilirubin 0.3 - 1.2 mg/dL 0.7  0.8  0.7   Alkaline Phos 38 - 126 U/L 59  65  72   AST 15 - 41 U/L 14  23  15    ALT 0 - 44 U/L 11  26  12      Lab Results  Component Value Date   CA2729 1,102.7 (H) 03/25/2023   YQ6578 1,849.7 (H) 02/11/2023   CA2729 756.1 (H) 01/14/2023     Adverse Effects  Assessment Scr: continues to improve at each visit. Baseline value is 1.57 mg/dL on 4/69/62.  Calcium: is borderline low in a slight decrease from last visit. Patient provided education on improving calcium levels.  Hgb: has remained stable around 11 g/dL.  Platelets: decreased to 131 K/uL. We will continue to monitor this.   Adherence Assessment Jesse Hoffman reports missing 1 doses over the past 4 weeks.   Reason for missed dose: forgot to take while traveling Patient was re-educated on importance of adherence.   Access Assessment Jesse Hoffman is currently receiving his abemaciclib through [employer] concerns:  none  Medication Reconciliation The patient's medication list was reviewed today with the patient? Yes New medications or herbal supplements have recently been started? Yes  - Humalog Any medications have been discontinued? No  The medication list was updated and reconciled based on the patient's most recent medication list in the electronic medical record (EMR) including herbal products and OTC medications.   Medications Current Outpatient Medications  Medication Sig Dispense Refill   Continuous Glucose Sensor (FREESTYLE LIBRE 3 PLUS SENSOR) MISC change every 15 days     dextromethorphan-guaiFENesin (MUCINEX DM) 30-600 MG 12hr tablet Take 1 tablet by mouth daily.     HUMULIN N KWIKPEN 100 UNIT/ML KwikPen 4 units in AM after breakfast Subcutaneous daily     abemaciclib (VERZENIO) 50 MG tablet Take 1 tablet (50 mg total) by mouth 2 (two) times daily. 60 tablet 1   amLODipine (NORVASC) 5 MG tablet Take 5 mg by mouth daily.     atorvastatin (LIPITOR) 40 MG tablet Take 1 tablet (40 mg total) by mouth daily at 6 PM. Restart after 1 week, when Liver function tests normalize     calcium-vitamin D (OSCAL WITH D) 500-5 MG-MCG tablet Take 1 tablet by mouth daily. 30 tablet 5   FARXIGA 10 MG TABS tablet Take 10 mg by mouth daily.     guaiFENesin  (MUCINEX) 600 MG 12 hr tablet Take 1,200 mg by mouth 2 (two) times daily.     KERENDIA 10 MG TABS Take 10 mg by mouth daily.  losartan (COZAAR) 50 MG tablet Take 50 mg by mouth daily.     omeprazole (PRILOSEC) 20 MG capsule Take 1 capsule (20 mg total) by mouth daily. 30 capsule 3   ONETOUCH VERIO test strip in the morning and at bedtime.     tamoxifen (NOLVADEX) 20 MG tablet Take 1 tablet (20 mg total) by mouth daily. 90 tablet 3   zolpidem (AMBIEN) 5 MG tablet Take 1 tablet (5 mg total) by mouth at bedtime as needed for sleep. 30 tablet 1   No current facility-administered medications for this visit.    Drug-Drug Interactions (DDIs) DDIs were evaluated? Yes Significant DDIs? No  The patient was instructed to speak with their health care provider and/or the oral chemotherapy pharmacist before starting any new drug, including prescription or over the counter, natural / herbal products, or vitamins.  Supportive Care Diarrhea: we reviewed that diarrhea is common with abemaciclib and confirmed that she does have loperamide (Imodium) at home.  We reviewed how to take this medication PRN. Neutropenia: we discussed the importance of having a thermometer and what the Centers for Disease Control and Prevention (CDC) considers a fever which is 100.79F (38C) or higher.  Gave patient 24/7 triage line to call if any fevers or symptoms. Jesse Hoffman: we reviewed potential symptoms including cough, shortness, and fatigue.  VTE: reviewed signs of DVT such as leg swelling, redness, pain, or tenderness and signs of PE such as shortness of breath, rapid or irregular heartbeat, cough, chest pain, or lightheadedness. Reviewed to take the medication every 12 hours (with food sometimes can be easier on the stomach) and to take it at the same time every day. Hepatotoxicity: WNL Drug interactions with grapefruit products  Dosing Assessment Hepatic adjustments needed? No  Renal adjustments needed? No   Toxicity adjustments needed? No  The current dosing regimen is appropriate to continue at this time.  Follow-Up Plan Continue abemaciclib 50 mg by mouth every 12 hours. No dose increases at this time Continue tamoxifen 20 mg by mouth daily Tentative plan to start [facility] January 2025 given recent dental procedure on 03/25/23 Monitor serum creatinine, calcium, hemoglobin Continually assess for worsening fatigue  Plan for Dr. Pamelia Hoit visit with labs on 05/20/23 Will add labs and pharmacy clinic visit for visit around 06/16/23 Hypertension and diabetes management per primary   Jesse Hoffman participated in the discussion, expressed understanding, and voiced agreement with the above plan. All questions were answered to her satisfaction. The patient was advised to contact the clinic at [phone] with any questions or concerns prior to her return visit.   I spent 25 minutes assessing and educating the patient.  Jerry Caras, PharmD PGY2 Oncology Pharmacy Resident   04/22/2023 3:08 PM

## 2023-04-23 LAB — CANCER ANTIGEN 27.29: CA 27.29: 679.9 U/mL — ABNORMAL HIGH (ref 0.0–38.6)

## 2023-04-24 ENCOUNTER — Other Ambulatory Visit: Payer: Self-pay

## 2023-04-24 ENCOUNTER — Other Ambulatory Visit: Payer: Self-pay | Admitting: Hematology and Oncology

## 2023-04-24 NOTE — Progress Notes (Signed)
Specialty Pharmacy Refill Coordination Note  Jesse Hoffman is a 87 y.o. male contacted today regarding refills of specialty medication(s) Abemaciclib   Patient requested [name] at [facility] at [place] date: 05/01/23   Medication will be filled on 04/30/23.

## 2023-04-24 NOTE — Progress Notes (Signed)
Specialty Pharmacy Ongoing Clinical Assessment Note  Jesse Hoffman is a 87 y.o. male who is being followed by the specialty pharmacy service for RxSp Oncology   Patient's specialty medication(s) reviewed today: Abemaciclib   Missed doses in the last 4 weeks: 0   Patient/Caregiver did not have any additional questions or concerns.   Therapeutic benefit summary: Patient is achieving benefit   Adverse events/side effects summary: Experienced adverse events/side effects (tolerable fatigue)   Patient's therapy is appropriate to: Continue    Goals Addressed             This Visit's Progress    Slow Disease Progression       Patient is on track. Patient will maintain adherence. CT from 04/17/23 showed stable disease.          Follow up:  3 months  Otto Herb Specialty Pharmacist

## 2023-04-25 ENCOUNTER — Telehealth: Payer: Self-pay | Admitting: Pharmacist

## 2023-04-25 NOTE — Telephone Encounter (Signed)
Per Jonny Ruiz 10/30 LOS patient is aware of scheduled appointment times/dates for follow up; patient has call back number if needed

## 2023-04-27 ENCOUNTER — Other Ambulatory Visit: Payer: Self-pay

## 2023-04-27 DIAGNOSIS — L821 Other seborrheic keratosis: Secondary | ICD-10-CM | POA: Diagnosis not present

## 2023-04-27 DIAGNOSIS — Z08 Encounter for follow-up examination after completed treatment for malignant neoplasm: Secondary | ICD-10-CM | POA: Diagnosis not present

## 2023-04-27 DIAGNOSIS — Z85828 Personal history of other malignant neoplasm of skin: Secondary | ICD-10-CM | POA: Diagnosis not present

## 2023-04-27 DIAGNOSIS — L814 Other melanin hyperpigmentation: Secondary | ICD-10-CM | POA: Diagnosis not present

## 2023-04-27 DIAGNOSIS — D225 Melanocytic nevi of trunk: Secondary | ICD-10-CM | POA: Diagnosis not present

## 2023-04-27 DIAGNOSIS — L570 Actinic keratosis: Secondary | ICD-10-CM | POA: Diagnosis not present

## 2023-04-27 MED ORDER — ABEMACICLIB 50 MG PO TABS
50.0000 mg | ORAL_TABLET | Freq: Two times a day (BID) | ORAL | 5 refills | Status: DC
  Filled 2023-04-27: qty 56, 28d supply, fill #0
  Filled 2023-05-19: qty 56, 28d supply, fill #1
  Filled 2023-06-19: qty 56, 28d supply, fill #2
  Filled 2023-07-14: qty 56, 28d supply, fill #3
  Filled 2023-08-13: qty 56, 28d supply, fill #4
  Filled 2023-09-10: qty 56, 28d supply, fill #5

## 2023-05-01 ENCOUNTER — Other Ambulatory Visit (HOSPITAL_COMMUNITY): Payer: Self-pay

## 2023-05-19 ENCOUNTER — Inpatient Hospital Stay: Payer: Medicare Other | Attending: Hematology and Oncology

## 2023-05-19 ENCOUNTER — Other Ambulatory Visit (HOSPITAL_COMMUNITY): Payer: Self-pay

## 2023-05-19 ENCOUNTER — Other Ambulatory Visit (HOSPITAL_COMMUNITY): Payer: Self-pay | Admitting: Pharmacy Technician

## 2023-05-19 ENCOUNTER — Inpatient Hospital Stay: Payer: Medicare Other | Admitting: Hematology and Oncology

## 2023-05-19 VITALS — BP 110/56 | HR 80 | Temp 97.7°F | Resp 18 | Ht 71.0 in | Wt 162.9 lb

## 2023-05-19 DIAGNOSIS — C50022 Malignant neoplasm of nipple and areola, left male breast: Secondary | ICD-10-CM | POA: Diagnosis not present

## 2023-05-19 DIAGNOSIS — R5383 Other fatigue: Secondary | ICD-10-CM | POA: Insufficient documentation

## 2023-05-19 DIAGNOSIS — C50922 Malignant neoplasm of unspecified site of left male breast: Secondary | ICD-10-CM | POA: Diagnosis not present

## 2023-05-19 DIAGNOSIS — C7951 Secondary malignant neoplasm of bone: Secondary | ICD-10-CM | POA: Diagnosis not present

## 2023-05-19 DIAGNOSIS — Z79810 Long term (current) use of selective estrogen receptor modulators (SERMs): Secondary | ICD-10-CM | POA: Insufficient documentation

## 2023-05-19 DIAGNOSIS — Z170 Estrogen receptor positive status [ER+]: Secondary | ICD-10-CM

## 2023-05-19 LAB — CMP (CANCER CENTER ONLY)
ALT: 9 U/L (ref 0–44)
AST: 14 U/L — ABNORMAL LOW (ref 15–41)
Albumin: 4 g/dL (ref 3.5–5.0)
Alkaline Phosphatase: 60 U/L (ref 38–126)
Anion gap: 3 — ABNORMAL LOW (ref 5–15)
BUN: 26 mg/dL — ABNORMAL HIGH (ref 8–23)
CO2: 26 mmol/L (ref 22–32)
Calcium: 8.9 mg/dL (ref 8.9–10.3)
Chloride: 110 mmol/L (ref 98–111)
Creatinine: 2.09 mg/dL — ABNORMAL HIGH (ref 0.61–1.24)
GFR, Estimated: 30 mL/min — ABNORMAL LOW (ref >60)
Glucose, Bld: 197 mg/dL — ABNORMAL HIGH (ref 70–99)
Potassium: 4.4 mmol/L (ref 3.5–5.1)
Sodium: 139 mmol/L (ref 135–145)
Total Bilirubin: 0.6 mg/dL (ref <1.2)
Total Protein: 6.6 g/dL (ref 6.5–8.1)

## 2023-05-19 LAB — CBC WITH DIFFERENTIAL (CANCER CENTER ONLY)
Abs Immature Granulocytes: 0.01 10*3/uL (ref 0.00–0.07)
Basophils Absolute: 0 10*3/uL (ref 0.0–0.1)
Basophils Relative: 1 %
Eosinophils Absolute: 0.1 10*3/uL (ref 0.0–0.5)
Eosinophils Relative: 3 %
HCT: 33 % — ABNORMAL LOW (ref 39.0–52.0)
Hemoglobin: 11.3 g/dL — ABNORMAL LOW (ref 13.0–17.0)
Immature Granulocytes: 0 %
Lymphocytes Relative: 39 %
Lymphs Abs: 1.4 10*3/uL (ref 0.7–4.0)
MCH: 34.5 pg — ABNORMAL HIGH (ref 26.0–34.0)
MCHC: 34.2 g/dL (ref 30.0–36.0)
MCV: 100.6 fL — ABNORMAL HIGH (ref 80.0–100.0)
Monocytes Absolute: 0.2 10*3/uL (ref 0.1–1.0)
Monocytes Relative: 7 %
Neutro Abs: 1.8 10*3/uL (ref 1.7–7.7)
Neutrophils Relative %: 50 %
Platelet Count: 119 10*3/uL — ABNORMAL LOW (ref 150–400)
RBC: 3.28 MIL/uL — ABNORMAL LOW (ref 4.22–5.81)
RDW: 12.4 % (ref 11.5–15.5)
WBC Count: 3.5 10*3/uL — ABNORMAL LOW (ref 4.0–10.5)
nRBC: 0 % (ref 0.0–0.2)

## 2023-05-19 NOTE — Assessment & Plan Note (Signed)
12/09/2022: mammogram detected asymmetry/mass retroareolar left breast with left nipple retraction and skin thickening. Ultrasound: 3.9 cm irregular hypoechoic mass retroareolar left breast, 4 abnormal left axillary lymph nodes with cortical thickening, mild right gynecomastia. 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  CT scan on 04/17/2023 interval improvement of left breast mass, left axillary mediastinal lymph nodes and bilateral pulmonary nodules.  Widespread bone metastases, indeterminate lesions lower poles of both kidneys complex cysts  Continue current therapy and rescan in 3 months

## 2023-05-19 NOTE — Progress Notes (Signed)
Specialty Pharmacy Refill Coordination Note  Jesse Hoffman is a 87 y.o. male contacted today regarding refills of specialty medication(s) Abemaciclib   Patient requested Jesse Hoffman at [facility] at [place] date: 05/27/23   Medication will be filled on 05/26/23.

## 2023-05-19 NOTE — Progress Notes (Signed)
Patient Care Team: Georgianne Fick, MD as PCP - General (Internal Medicine) Thurmon Fair, MD as PCP - Cardiology (Cardiology) Pershing Proud, RN as Oncology Nurse Navigator Donnelly Angelica, RN as Oncology Nurse Navigator Serena Croissant, MD as Consulting Physician (Hematology and Oncology) Anselm Lis, RPH-CPP as Pharmacist (Hematology and Oncology)  DIAGNOSIS:  Encounter Diagnosis  Name Primary?   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 Ultrasound: 3.9 cm irregular hypoechoic mass retroareolar left breast, 4 abnormal left axillary lymph nodes with cortical thickening, mild right gynecomastia. Biopsy: Grade 2 IDC, LVI present, lymph node positive, ER 100%, PR 50%, Ki-67 30%, HER2 2+, FISH negative ratio 1.08    12/23/2022 Cancer Staging   Staging form: Breast, AJCC 8th Edition - Clinical: Stage IIA (cT2, cN1, cM0, G2, ER+, PR+, HER2-) - Signed by Serena Croissant, MD on 12/23/2022 Stage prefix: Initial diagnosis Histologic grading system: 3 grade system   12/30/2022 Genetic Testing   Negative [facility] Common Hereditary Cancers +RNA Panel.  Report date is 12/30/2022.    The [employer] Common Hereditary Cancers + RNA Panel includes sequencing, deletion/duplication, and RNA analysis of the following 48 genes: APC, ATM, AXIN2, BAP1, BARD1, BMPR1A, BRCA1, BRCA2, BRIP1, CDH1, CDK4*, CDKN2A*, CHEK2, CTNNA1, DICER1, EPCAM* (del/dup only), FH, GREM1* (promoter dup analysis only), HOXB13*, KIT*, MBD4*, MEN1, MLH1, MSH2, MSH3, MSH6, MUTYH, NF1, NTHL1, PALB2, PDGFRA*, PMS2, POLD1, POLE, PTEN, RAD51C, RAD51D, SDHA (sequencing only), SDHB, SDHC, SDHD, SMAD4, SMARCA4,  STK11, TP53, TSC1, TSC2, VHL.  *Genes without RNA analysis.      CHIEF COMPLIANT: Metastatic breast cancer on tamoxifen with Verzenio  HISTORY OF PRESENT ILLNESS: Complains of fatigue  History of Present Illness   Jesse Hoffman, a patient with a history of met breast cancer, presents for a follow-up visit. He reports no nausea or diarrhea, common side effects of his cancer medication. However, he is experiencing fatigue, which he attributes to either his age or the medication. He also mentions that he has diabetes and is currently on insulin, which he takes before breakfast. He notes that his blood sugar levels tend to spike after breakfast, reaching up to 212, before coming back down. He is considering changing the timing of his insulin injection to after breakfast. He also mentions that his insulin dosage has been increased recently. Despite his health issues, Jesse Hoffman's appetite remains good.         ALLERGIES:  is allergic to allopurinol.  MEDICATIONS:  Current Outpatient Medications  Medication Sig Dispense Refill   abemaciclib (VERZENIO) 50 MG tablet Take 1 tablet (50 mg total) by mouth 2 (two) times daily. 56 tablet 5   amLODipine (NORVASC) 5 MG tablet Take 5 mg by mouth daily.     atorvastatin (LIPITOR) 40 MG tablet Take 1 tablet (40 mg total) by mouth daily at 6 PM. Restart after 1 week, when Liver function tests normalize     calcium-vitamin D (OSCAL WITH D) 500-5 MG-MCG tablet Take 1 tablet by mouth daily. 30 tablet 5   Continuous Glucose Sensor (FREESTYLE LIBRE 3 PLUS SENSOR) MISC change every 15 days     dextromethorphan-guaiFENesin (MUCINEX DM) 30-600 MG 12hr tablet Take 1 tablet by mouth daily.     FARXIGA 10 MG  TABS tablet Take 10 mg by mouth daily.     guaiFENesin (MUCINEX) 600 MG 12 hr tablet Take 1,200 mg by mouth 2 (two) times daily.     HUMULIN N KWIKPEN 100 UNIT/ML KwikPen 4 units in AM after breakfast Subcutaneous daily     KERENDIA 10 MG TABS Take 10 mg by mouth daily.      losartan (COZAAR) 50 MG tablet Take 50 mg by mouth daily.     omeprazole (PRILOSEC) 20 MG capsule Take 1 capsule (20 mg total) by mouth daily. 30 capsule 3   ONETOUCH VERIO test strip in the morning and at bedtime.     tamoxifen (NOLVADEX) 20 MG tablet Take 1 tablet (20 mg total) by mouth daily. 90 tablet 3   zolpidem (AMBIEN) 5 MG tablet Take 1 tablet (5 mg total) by mouth at bedtime as needed for sleep. 30 tablet 1   No current facility-administered medications for this visit.    PHYSICAL EXAMINATION: ECOG PERFORMANCE STATUS: 1 - Symptomatic but completely ambulatory  Vitals:   05/19/23 0958  BP: (!) 110/56  Pulse: 80  Resp: 18  Temp: 97.7 F (36.5 C)  SpO2: 100%   Filed Weights   05/19/23 0958  Weight: 162 lb 14.4 oz (73.9 kg)      LABORATORY DATA:  I have reviewed the data as listed    Latest Ref Rng & Units 05/19/2023    9:29 AM 04/22/2023    1:15 PM 03/25/2023   11:01 AM  CMP  Glucose 70 - 99 mg/dL 604  89  540   BUN 8 - 23 mg/dL 26  27  24    Creatinine 0.61 - 1.24 mg/dL 9.81  1.91  4.78   Sodium 135 - 145 mmol/L 139  139  138   Potassium 3.5 - 5.1 mmol/L 4.4  4.6  3.9   Chloride 98 - 111 mmol/L 110  107  107   CO2 22 - 32 mmol/L 26  27  25    Calcium 8.9 - 10.3 mg/dL 8.9  8.8  9.0   Total Protein 6.5 - 8.1 g/dL 6.6  6.7  6.6   Total Bilirubin <1.2 mg/dL 0.6  0.7  0.8   Alkaline Phos 38 - 126 U/L 60  59  65   AST 15 - 41 U/L 14  14  23    ALT 0 - 44 U/L 9  11  26      Lab Results  Component Value Date   WBC 3.5 (L) 05/19/2023   HGB 11.3 (L) 05/19/2023   HCT 33.0 (L) 05/19/2023   MCV 100.6 (H) 05/19/2023   PLT 119 (L) 05/19/2023   NEUTROABS 1.8 05/19/2023    ASSESSMENT & PLAN:  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 Ultrasound: 3.9 cm irregular hypoechoic mass retroareolar left breast, 4 abnormal left  axillary lymph nodes with cortical thickening, mild right gynecomastia. Biopsy: Grade 2 IDC, LVI present, lymph node positive, ER 100%, PR 50%, Ki-67 30%, HER2 2+, FISH negative ratio 1.08    CT CAP 01/11/2023: Left breast mass, left axillary lymph nodes, mediastinal and hilar lymphadenopathy, multiple bilateral lung nodules, sclerotic lesions throughout thoracic spine, pelvis and lumbar spine, multiple lesions left and right kidney (suggestive of solid kidney cancer)   Current treatment: Tamoxifen with Verzenio started 01/16/2023 [name] toxicities: Severe fatigue: Stable: We discussed taking oral B12 supplement. Continue with the  same dosage of [name].  CT scan on 04/17/2023 interval improvement of left breast mass, left axillary mediastinal lymph nodes and bilateral pulmonary nodules.  Widespread bone metastases, indeterminate lesions lower poles of both kidneys complex cysts  Continue current therapy and rescan in 3 months     Orders Placed This Encounter  Procedures   CT CHEST ABDOMEN PELVIS W CONTRAST    Standing Status:   Future    Standing Expiration Date:   05/18/2024    Order Specific Question:   If indicated for the ordered procedure, I authorize the administration of contrast media per Radiology protocol    Answer:   Yes    Order Specific Question:   Does the patient have a contrast media/X-ray dye allergy?    Answer:   No    Order Specific Question:   Preferred imaging location?    Answer:   [facility]    Order Specific Question:   Release to patient    Answer:   Immediate    Order Specific Question:   If indicated for the ordered procedure, I authorize the administration of oral contrast media per Radiology protocol    Answer:   Yes   The patient has a good understanding of the overall plan. he agrees with it. he will call with any problems that may develop before the next visit here. Total time spent: 30 mins including face to face time and time spent for  planning, charting and co-ordination of care   Jesse Meek, MD 05/19/23

## 2023-05-21 LAB — CANCER ANTIGEN 27.29: CA 27.29: 437 U/mL — ABNORMAL HIGH (ref 0.0–38.6)

## 2023-05-26 ENCOUNTER — Other Ambulatory Visit: Payer: Self-pay

## 2023-05-27 ENCOUNTER — Other Ambulatory Visit (HOSPITAL_COMMUNITY): Payer: Self-pay

## 2023-05-29 ENCOUNTER — Telehealth: Payer: Self-pay

## 2023-05-29 NOTE — Telephone Encounter (Signed)
Notified Patient of prior authorization renewal for Verzenio 100 mg tablets. Medication is approved through 06/22/2024. No other needs or concerns noted at this time.

## 2023-06-02 DIAGNOSIS — E782 Mixed hyperlipidemia: Secondary | ICD-10-CM | POA: Diagnosis not present

## 2023-06-02 DIAGNOSIS — E1121 Type 2 diabetes mellitus with diabetic nephropathy: Secondary | ICD-10-CM | POA: Diagnosis not present

## 2023-06-02 DIAGNOSIS — N1831 Chronic kidney disease, stage 3a: Secondary | ICD-10-CM | POA: Diagnosis not present

## 2023-06-02 DIAGNOSIS — E1122 Type 2 diabetes mellitus with diabetic chronic kidney disease: Secondary | ICD-10-CM | POA: Diagnosis not present

## 2023-06-09 DIAGNOSIS — N1831 Chronic kidney disease, stage 3a: Secondary | ICD-10-CM | POA: Diagnosis not present

## 2023-06-09 DIAGNOSIS — E113293 Type 2 diabetes mellitus with mild nonproliferative diabetic retinopathy without macular edema, bilateral: Secondary | ICD-10-CM | POA: Diagnosis not present

## 2023-06-09 DIAGNOSIS — M1A0790 Idiopathic chronic gout, unspecified ankle and foot, without tophus (tophi): Secondary | ICD-10-CM | POA: Diagnosis not present

## 2023-06-09 DIAGNOSIS — I10 Essential (primary) hypertension: Secondary | ICD-10-CM | POA: Diagnosis not present

## 2023-06-09 DIAGNOSIS — E1121 Type 2 diabetes mellitus with diabetic nephropathy: Secondary | ICD-10-CM | POA: Diagnosis not present

## 2023-06-09 DIAGNOSIS — C50922 Malignant neoplasm of unspecified site of left male breast: Secondary | ICD-10-CM | POA: Diagnosis not present

## 2023-06-09 DIAGNOSIS — R011 Cardiac murmur, unspecified: Secondary | ICD-10-CM | POA: Diagnosis not present

## 2023-06-09 DIAGNOSIS — Z170 Estrogen receptor positive status [ER+]: Secondary | ICD-10-CM | POA: Diagnosis not present

## 2023-06-09 DIAGNOSIS — E1122 Type 2 diabetes mellitus with diabetic chronic kidney disease: Secondary | ICD-10-CM | POA: Diagnosis not present

## 2023-06-09 DIAGNOSIS — C799 Secondary malignant neoplasm of unspecified site: Secondary | ICD-10-CM | POA: Diagnosis not present

## 2023-06-09 DIAGNOSIS — E782 Mixed hyperlipidemia: Secondary | ICD-10-CM | POA: Diagnosis not present

## 2023-06-12 ENCOUNTER — Other Ambulatory Visit: Payer: Self-pay

## 2023-06-15 ENCOUNTER — Inpatient Hospital Stay: Payer: Medicare Other | Attending: Hematology and Oncology

## 2023-06-15 ENCOUNTER — Other Ambulatory Visit: Payer: Self-pay

## 2023-06-15 ENCOUNTER — Inpatient Hospital Stay: Payer: Medicare Other | Admitting: Pharmacist

## 2023-06-15 VITALS — BP 116/69 | HR 71 | Temp 97.6°F | Resp 18 | Ht 71.0 in | Wt 162.4 lb

## 2023-06-15 DIAGNOSIS — C50022 Malignant neoplasm of nipple and areola, left male breast: Secondary | ICD-10-CM | POA: Diagnosis not present

## 2023-06-15 DIAGNOSIS — R5383 Other fatigue: Secondary | ICD-10-CM | POA: Diagnosis not present

## 2023-06-15 DIAGNOSIS — Z170 Estrogen receptor positive status [ER+]: Secondary | ICD-10-CM

## 2023-06-15 DIAGNOSIS — Z79810 Long term (current) use of selective estrogen receptor modulators (SERMs): Secondary | ICD-10-CM | POA: Diagnosis not present

## 2023-06-15 LAB — CBC WITH DIFFERENTIAL (CANCER CENTER ONLY)
Abs Immature Granulocytes: 0.01 10*3/uL (ref 0.00–0.07)
Basophils Absolute: 0.1 10*3/uL (ref 0.0–0.1)
Basophils Relative: 2 %
Eosinophils Absolute: 0.1 10*3/uL (ref 0.0–0.5)
Eosinophils Relative: 3 %
HCT: 33.4 % — ABNORMAL LOW (ref 39.0–52.0)
Hemoglobin: 11.4 g/dL — ABNORMAL LOW (ref 13.0–17.0)
Immature Granulocytes: 0 %
Lymphocytes Relative: 37 %
Lymphs Abs: 1.7 10*3/uL (ref 0.7–4.0)
MCH: 33.6 pg (ref 26.0–34.0)
MCHC: 34.1 g/dL (ref 30.0–36.0)
MCV: 98.5 fL (ref 80.0–100.0)
Monocytes Absolute: 0.3 10*3/uL (ref 0.1–1.0)
Monocytes Relative: 6 %
Neutro Abs: 2.5 10*3/uL (ref 1.7–7.7)
Neutrophils Relative %: 52 %
Platelet Count: 171 10*3/uL (ref 150–400)
RBC: 3.39 MIL/uL — ABNORMAL LOW (ref 4.22–5.81)
RDW: 11.9 % (ref 11.5–15.5)
WBC Count: 4.7 10*3/uL (ref 4.0–10.5)
nRBC: 0 % (ref 0.0–0.2)

## 2023-06-15 LAB — CMP (CANCER CENTER ONLY)
ALT: 14 U/L (ref 0–44)
AST: 17 U/L (ref 15–41)
Albumin: 3.9 g/dL (ref 3.5–5.0)
Alkaline Phosphatase: 55 U/L (ref 38–126)
Anion gap: 6 (ref 5–15)
BUN: 29 mg/dL — ABNORMAL HIGH (ref 8–23)
CO2: 26 mmol/L (ref 22–32)
Calcium: 8.7 mg/dL — ABNORMAL LOW (ref 8.9–10.3)
Chloride: 109 mmol/L (ref 98–111)
Creatinine: 2.11 mg/dL — ABNORMAL HIGH (ref 0.61–1.24)
GFR, Estimated: 30 mL/min — ABNORMAL LOW (ref >60)
Glucose, Bld: 151 mg/dL — ABNORMAL HIGH (ref 70–99)
Potassium: 4.2 mmol/L (ref 3.5–5.1)
Sodium: 141 mmol/L (ref 135–145)
Total Bilirubin: 0.6 mg/dL (ref <1.2)
Total Protein: 6.4 g/dL — ABNORMAL LOW (ref 6.5–8.1)

## 2023-06-15 NOTE — Progress Notes (Signed)
[facility]       Telephone: [phone]?Fax: [phone]   Oncology Clinical Pharmacist Practitioner Progress Note  Jesse Hoffman was contacted via in-person to discuss his chemotherapy regimen for abemaciclib which they receive under the care of Dr. Serena Croissant. Today he presents to clinic with his wife.    Current treatment regimen and start date Abemaciclib (01/16/23) Tamoxifen (01/12/23) Denosumab 120 mg (has been on hold with recent tooth extraction) -- likely start on 07/28/23   Interval History He continues on abemaciclib 50 mg by mouth every 12 hours on days 1 to 28 of a 28-day cycle. This is being given in combination with tamoxifen. Therapy is planned to continue until disease progression or unacceptable toxicity. He last saw Dr. Pamelia Hoit on 05/19/23 and clinical pharmacy on 04/12/23. Jesse Hoffman is now taking 4 units of insulin in the morning and 2 units of insulin in the evening. This is being managed by Dr. Nicholos Johns.   Response to Therapy Jesse Hoffman continues to do well on abemaciclib. He is having continued fatigue but it is manageable. His serum creatinine continues to be elevated above his baseline value of 1.57 mg/dL on 09/23/45. Today estimated at 2.11 mg/dL. He does admit struggling to get in enough water per day but does drink several glasses of non-caffeinated, unsweetened tea.   He has his next appointment scheduled with Dr. Pamelia Hoit on 07/28/23 and we will add for him to have his first denosumab 120 mg Rivka Barbara) injection at that time since his dental work was done in early October. We will also add labs. We explained to Jesse Hoffman today that he should continue taking his vitamin D / calcium supplement. We will likely see him back 2-3 months after seeing Dr. Pamelia Hoit. Denosumab will continue every 12 weeks and Dr. Pamelia Hoit has ordered scans for the last week of January. Information given today for radiology scheduling. Labs, vitals, treatment parameters, and  manufacturer guidelines assessing toxicity were reviewed with Jesse Hoffman today. Based on these values, patient is in agreement to continue abemaciclib therapy at this time.  Allergies Allergies  Allergen Reactions   Allopurinol     hepatotoxicity    Vitals    06/15/2023   10:16 AM 05/19/2023    9:58 AM 04/22/2023    2:03 PM  Oncology Vitals  Height 180 cm 180 cm   Weight 73.664 kg 73.891 kg 74.435 kg  Weight (lbs) 162 lbs 6 oz 162 lbs 14 oz 164 lbs 2 oz  BMI 22.65 kg/m2 22.72 kg/m2 22.89 kg/m2  Temp 97.6 F (36.4 C) 97.7 F (36.5 C) 97.3 F (36.3 C)  Pulse Rate 71 80 77  BP 116/69 110/56 132/65  Resp 18 18 18   SpO2 99 % 100 % 100 %  BSA (m2) 1.92 m2 1.92 m2 1.93 m2    Laboratory Data    Latest Ref Rng & Units 06/15/2023    9:20 AM 05/19/2023    9:29 AM 04/22/2023    1:15 PM  CBC EXTENDED  WBC 4.0 - 10.5 K/uL 4.7  3.5  4.9   RBC 4.22 - 5.81 MIL/uL 3.39  3.28  3.22   Hemoglobin 13.0 - 17.0 g/dL 42.5  95.6  38.7   HCT 39.0 - 52.0 % 33.4  33.0  32.3   Platelets 150 - 400 K/uL 171  119  131   NEUT# 1.7 - 7.7 K/uL 2.5  1.8  2.5   Lymph# 0.7 - 4.0 K/uL 1.7  1.4  1.7        Latest Ref Rng & Units 06/15/2023    9:20 AM 05/19/2023    9:29 AM 04/22/2023    1:15 PM  CMP  Glucose 70 - 99 mg/dL 161  096  89   BUN 8 - 23 mg/dL 29  26  27    Creatinine 0.61 - 1.24 mg/dL 0.45  4.09  8.11   Sodium 135 - 145 mmol/L 141  139  139   Potassium 3.5 - 5.1 mmol/L 4.2  4.4  4.6   Chloride 98 - 111 mmol/L 109  110  107   CO2 22 - 32 mmol/L 26  26  27    Calcium 8.9 - 10.3 mg/dL 8.7  8.9  8.8   Total Protein 6.5 - 8.1 g/dL 6.4  6.6  6.7   Total Bilirubin <1.2 mg/dL 0.6  0.6  0.7   Alkaline Phos 38 - 126 U/L 55  60  59   AST 15 - 41 U/L 17  14  14    ALT 0 - 44 U/L 14  9  11      No results found for: "MG" Lab Results  Component Value Date   CA2729 437.0 (H) 05/19/2023   CA2729 679.9 (H) 04/22/2023   [id number] 1,102.7 (H) 03/25/2023     Adverse Effects Assessment Serum  creatinine: estimated at 2.11 mg/dL today (baseline estimated at 1.57 mg/dL on 9/56/21). This is 1.34 x his baseline value (Grade 2 toxicity would be 1.5 x baseline value). Continue to monitor. Corrected calcium estimated at 8.78 mg/dL after adjusting for albumin. He will continue vitamin d / calcium supplement.  Adherence Assessment Jesse Hoffman reports missing 0 doses over the past 4 weeks.   Reason for missed dose: N/A Patient was re-educated on importance of adherence.   Access Assessment Jesse Hoffman is currently receiving his abemaciclib through [employer] concerns:  none  Medication Reconciliation The patient's medication list was reviewed today with the patient? Yes New medications or herbal supplements have recently been started? No  Any medications have been discontinued? No  The medication list was updated and reconciled based on the patient's most recent medication list in the electronic medical record (EMR) including herbal products and OTC medications.   Medications Current Outpatient Medications  Medication Sig Dispense Refill   abemaciclib (VERZENIO) 50 MG tablet Take 1 tablet (50 mg total) by mouth 2 (two) times daily. 56 tablet 5   amLODipine (NORVASC) 5 MG tablet Take 5 mg by mouth daily.     atorvastatin (LIPITOR) 40 MG tablet Take 1 tablet (40 mg total) by mouth daily at 6 PM. Restart after 1 week, when Liver function tests normalize     calcium-vitamin D (OSCAL WITH D) 500-5 MG-MCG tablet Take 1 tablet by mouth daily. 30 tablet 5   Continuous Glucose Sensor (FREESTYLE LIBRE 3 PLUS SENSOR) MISC change every 15 days     dextromethorphan-guaiFENesin (MUCINEX DM) 30-600 MG 12hr tablet Take 1 tablet by mouth daily.     FARXIGA 10 MG TABS tablet Take 10 mg by mouth daily.     guaiFENesin (MUCINEX) 600 MG 12 hr tablet Take 1,200 mg by mouth 2 (two) times daily.     HUMULIN N KWIKPEN 100 UNIT/ML KwikPen Now taking 4 units AM, 2  units PM per patient report     KERENDIA 10 MG TABS Take 10 mg by mouth daily.     losartan (COZAAR) 50  MG tablet Take 50 mg by mouth daily.     omeprazole (PRILOSEC) 20 MG capsule Take 1 capsule (20 mg total) by mouth daily. 30 capsule 3   ONETOUCH VERIO test strip in the morning and at bedtime.     tamoxifen (NOLVADEX) 20 MG tablet Take 1 tablet (20 mg total) by mouth daily. 90 tablet 3   zolpidem (AMBIEN) 5 MG tablet Take 1 tablet (5 mg total) by mouth at bedtime as needed for sleep. 30 tablet 1   No current facility-administered medications for this visit.    Drug-Drug Interactions (DDIs) DDIs were evaluated? Yes Significant DDIs? No  The patient was instructed to speak with their health care provider and/or the oral chemotherapy pharmacist before starting any new drug, including prescription or over the counter, natural / herbal products, or vitamins.  Supportive Care Diarrhea: we reviewed that diarrhea is common with abemaciclib and confirmed that she does have loperamide (Imodium) at home.  We reviewed how to take this medication PRN. Neutropenia: we discussed the importance of having a thermometer and what the Centers for Disease Control and Prevention (CDC) considers a fever which is 100.64F (38C) or higher.  Gave patient 24/7 triage line to call if any fevers or symptoms. ILD/Pneumonitis: we reviewed potential symptoms including cough, shortness, and fatigue.  VTE: reviewed signs of DVT such as leg swelling, redness, pain, or tenderness and signs of PE such as shortness of breath, rapid or irregular heartbeat, cough, chest pain, or lightheadedness. Reviewed to take the medication every 12 hours (with food sometimes can be easier on the stomach) and to take it at the same time every day. Hepatotoxicity:WNL Drug interactions with grapefruit products  Dosing Assessment Hepatic adjustments needed? No  Renal adjustments needed? No  Toxicity adjustments needed? No  The current  dosing regimen is appropriate to continue at this time.  Follow-Up Plan Continue abemaciclib 50 mg by mouth every 12 hours. No dose increases at this time Continue tamoxifen 20 mg by mouth daily Tentative plan to start denosumab 120 mg (Xgeva) SubQ every 12 weeks on 07/28/23 given recent dental procedure on 03/25/23 Restaging scans ordered by Dr. Pamelia Hoit for 07/21/23 CA 27.29 will result tomorrow and go directly to Dr. Pamelia Hoit. Trending down. Monitor serum creatinine, calcium, hemoglobin, fatigue Will add labs and denosumab 120 mg injection to 07/28/23 Dr. Pamelia Hoit visit Dr. Pamelia Hoit will likely add follow up with clinical pharmacy in 3 months after his visit to coincide with next denosumab injection. Hypertension and diabetes management per primary   Jesse Hoffman participated in the discussion, expressed understanding, and voiced agreement with the above plan. All questions were answered to her satisfaction. The patient was advised to contact the clinic at [phone] with any questions or concerns prior to her return visit.   I spent 30 minutes assessing and educating the patient.  Canaan Prue A. Odetta Pink, PharmD, BCOP, CPP  Anselm Lis, RPH-CPP, 06/15/2023  10:34 AM   **Disclaimer: This note was dictated with voice recognition software. Similar sounding words can inadvertently be transcribed and this note may contain transcription errors which may not have been corrected upon publication of note.**

## 2023-06-16 ENCOUNTER — Other Ambulatory Visit (HOSPITAL_COMMUNITY): Payer: Self-pay

## 2023-06-16 ENCOUNTER — Encounter: Payer: Self-pay | Admitting: Hematology and Oncology

## 2023-06-16 ENCOUNTER — Telehealth: Payer: Self-pay | Admitting: Pharmacy Technician

## 2023-06-16 LAB — CANCER ANTIGEN 27.29: CA 27.29: 279.5 U/mL — ABNORMAL HIGH (ref 0.0–38.6)

## 2023-06-16 NOTE — Telephone Encounter (Signed)
Oral Oncology Patient Advocate Encounter  Was successful in securing patient a $15,000 grant from [employer] to provide copayment coverage for [employer].  This will keep the out of pocket expense at $0.     [employer] ID: [id number]  I have spoken with the patient.   The billing information is as follows and has been shared with [facility].    RxBin: [id number] PCN: [id number] Member ID: [id number] Group ID: [id number] Dates of Eligibility: 05/17/23 through 05/15/24  Fund:  Breast  Jinger Neighbors, CPhT-Adv Oncology Pharmacy Patient Advocate [facility] Direct Number: [phone]  Fax: [phone]

## 2023-06-16 NOTE — Telephone Encounter (Signed)
Called and spoke with patient and scheduled appt.

## 2023-06-19 ENCOUNTER — Other Ambulatory Visit: Payer: Self-pay

## 2023-06-19 NOTE — Progress Notes (Signed)
Specialty Pharmacy Refill Coordination Note  Jesse Hoffman is a 87 y.o. male contacted today regarding refills of specialty medication(s) Abemaciclib [name])   Patient requested [name] at [facility] at [place] date: 06/26/23   Medication will be filled on 06/25/23.

## 2023-06-25 ENCOUNTER — Other Ambulatory Visit (HOSPITAL_COMMUNITY): Payer: Self-pay

## 2023-06-25 ENCOUNTER — Other Ambulatory Visit: Payer: Self-pay

## 2023-06-26 ENCOUNTER — Other Ambulatory Visit (HOSPITAL_COMMUNITY): Payer: Self-pay

## 2023-07-13 ENCOUNTER — Other Ambulatory Visit: Payer: Self-pay | Admitting: Hematology and Oncology

## 2023-07-13 ENCOUNTER — Other Ambulatory Visit: Payer: Self-pay

## 2023-07-13 ENCOUNTER — Ambulatory Visit (HOSPITAL_COMMUNITY)
Admission: RE | Admit: 2023-07-13 | Discharge: 2023-07-13 | Disposition: A | Discharge status: Home / Self Care | Payer: Medicare Other | Source: Ambulatory Visit | Attending: Hematology and Oncology | Admitting: Hematology and Oncology

## 2023-07-13 DIAGNOSIS — C7951 Secondary malignant neoplasm of bone: Secondary | ICD-10-CM | POA: Diagnosis not present

## 2023-07-13 DIAGNOSIS — C50922 Malignant neoplasm of unspecified site of left male breast: Secondary | ICD-10-CM | POA: Diagnosis not present

## 2023-07-13 DIAGNOSIS — Z170 Estrogen receptor positive status [ER+]: Secondary | ICD-10-CM | POA: Diagnosis not present

## 2023-07-13 DIAGNOSIS — R591 Generalized enlarged lymph nodes: Secondary | ICD-10-CM | POA: Diagnosis not present

## 2023-07-13 DIAGNOSIS — R918 Other nonspecific abnormal finding of lung field: Secondary | ICD-10-CM | POA: Diagnosis not present

## 2023-07-14 ENCOUNTER — Other Ambulatory Visit: Payer: Self-pay

## 2023-07-14 NOTE — Progress Notes (Signed)
Specialty Pharmacy Refill Coordination Note  Jesse Hoffman is a 88 y.o. male contacted today regarding refills of specialty medication(s) Abemaciclib [name])   Patient requested [name] at [facility] at [place] date: 07/28/23   Medication will be filled on 07/27/23.

## 2023-07-16 ENCOUNTER — Other Ambulatory Visit (HOSPITAL_COMMUNITY): Payer: Self-pay

## 2023-07-17 ENCOUNTER — Other Ambulatory Visit (HOSPITAL_COMMUNITY): Payer: Self-pay

## 2023-07-17 ENCOUNTER — Other Ambulatory Visit: Payer: Self-pay

## 2023-07-20 ENCOUNTER — Other Ambulatory Visit (HOSPITAL_COMMUNITY): Payer: Self-pay

## 2023-07-21 ENCOUNTER — Inpatient Hospital Stay: Payer: Medicare Other | Attending: Hematology and Oncology

## 2023-07-21 DIAGNOSIS — C50922 Malignant neoplasm of unspecified site of left male breast: Secondary | ICD-10-CM

## 2023-07-21 DIAGNOSIS — C50022 Malignant neoplasm of nipple and areola, left male breast: Secondary | ICD-10-CM | POA: Insufficient documentation

## 2023-07-21 DIAGNOSIS — Z1721 Progesterone receptor positive status: Secondary | ICD-10-CM | POA: Diagnosis not present

## 2023-07-21 DIAGNOSIS — Z170 Estrogen receptor positive status [ER+]: Secondary | ICD-10-CM | POA: Diagnosis not present

## 2023-07-21 DIAGNOSIS — Z1732 Human epidermal growth factor receptor 2 negative status: Secondary | ICD-10-CM | POA: Diagnosis not present

## 2023-07-21 LAB — CMP (CANCER CENTER ONLY)
ALT: 11 U/L (ref 0–44)
AST: 14 U/L — ABNORMAL LOW (ref 15–41)
Albumin: 3.8 g/dL (ref 3.5–5.0)
Alkaline Phosphatase: 55 U/L (ref 38–126)
Anion gap: 5 (ref 5–15)
BUN: 28 mg/dL — ABNORMAL HIGH (ref 8–23)
CO2: 25 mmol/L (ref 22–32)
Calcium: 8.8 mg/dL — ABNORMAL LOW (ref 8.9–10.3)
Chloride: 111 mmol/L (ref 98–111)
Creatinine: 2.16 mg/dL — ABNORMAL HIGH (ref 0.61–1.24)
GFR, Estimated: 29 mL/min — ABNORMAL LOW (ref >60)
Glucose, Bld: 135 mg/dL — ABNORMAL HIGH (ref 70–99)
Potassium: 4.3 mmol/L (ref 3.5–5.1)
Sodium: 141 mmol/L (ref 135–145)
Total Bilirubin: 0.7 mg/dL (ref 0.0–1.2)
Total Protein: 6.4 g/dL — ABNORMAL LOW (ref 6.5–8.1)

## 2023-07-21 LAB — CBC WITH DIFFERENTIAL (CANCER CENTER ONLY)
Abs Immature Granulocytes: 0.01 10*3/uL (ref 0.00–0.07)
Basophils Absolute: 0.1 10*3/uL (ref 0.0–0.1)
Basophils Relative: 1 %
Eosinophils Absolute: 0.1 10*3/uL (ref 0.0–0.5)
Eosinophils Relative: 2 %
HCT: 33.9 % — ABNORMAL LOW (ref 39.0–52.0)
Hemoglobin: 11.3 g/dL — ABNORMAL LOW (ref 13.0–17.0)
Immature Granulocytes: 0 %
Lymphocytes Relative: 30 %
Lymphs Abs: 1.3 10*3/uL (ref 0.7–4.0)
MCH: 32.7 pg (ref 26.0–34.0)
MCHC: 33.3 g/dL (ref 30.0–36.0)
MCV: 98 fL (ref 80.0–100.0)
Monocytes Absolute: 0.3 10*3/uL (ref 0.1–1.0)
Monocytes Relative: 7 %
Neutro Abs: 2.4 10*3/uL (ref 1.7–7.7)
Neutrophils Relative %: 60 %
Platelet Count: 118 10*3/uL — ABNORMAL LOW (ref 150–400)
RBC: 3.46 MIL/uL — ABNORMAL LOW (ref 4.22–5.81)
RDW: 12.5 % (ref 11.5–15.5)
WBC Count: 4.2 10*3/uL (ref 4.0–10.5)
nRBC: 0 % (ref 0.0–0.2)

## 2023-07-22 LAB — CANCER ANTIGEN 27.29: CA 27.29: 145.9 U/mL — ABNORMAL HIGH (ref 0.0–38.6)

## 2023-07-27 NOTE — Assessment & Plan Note (Addendum)
12/09/2022: mammogram detected asymmetry/mass retroareolar left breast with left nipple retraction and skin thickening. Ultrasound: 3.9 cm irregular hypoechoic mass retroareolar left breast, 4 abnormal left axillary lymph nodes with cortical thickening, mild right gynecomastia. Biopsy: Grade 2 IDC, LVI present, lymph node positive, ER 100%, PR 50%, Ki-67 30%, HER2 2+, FISH negative ratio 1.08    CT CAP 01/11/2023: Left breast mass, left axillary lymph nodes, mediastinal and hilar lymphadenopathy, multiple bilateral lung nodules, sclerotic lesions throughout thoracic spine, pelvis and lumbar spine, multiple lesions left and right kidney (suggestive of solid kidney cancer)   Current treatment: Tamoxifen with Verzenio started 01/16/2023 [name] toxicities: Severe fatigue: Stable: We discussed taking oral B12 supplement. Continue with the same dosage of [name].   CT scan on 04/17/2023 interval improvement of left breast mass, left axillary mediastinal lymph nodes and bilateral pulmonary nodules.  Widespread bone metastases, indeterminate lesions lower poles of both kidneys complex cysts CT CAP 07/15/2023: Further interval improvement in the left breast mass as well as left axillary, mediastinal and pulmonary nodules, similar bone metastases  CA 27-29: 145.9 on 07/21/2023 (it was 1849 at 5 months ago) continue current therapy and rescan in 4 months

## 2023-07-28 ENCOUNTER — Other Ambulatory Visit (HOSPITAL_COMMUNITY): Payer: Self-pay

## 2023-07-28 ENCOUNTER — Inpatient Hospital Stay: Payer: Medicare Other

## 2023-07-28 ENCOUNTER — Inpatient Hospital Stay: Payer: Medicare Other | Attending: Hematology and Oncology | Admitting: Hematology and Oncology

## 2023-07-28 ENCOUNTER — Other Ambulatory Visit: Payer: Medicare Other

## 2023-07-28 VITALS — BP 130/63 | HR 71 | Temp 98.6°F | Resp 18 | Ht 71.0 in | Wt 166.4 lb

## 2023-07-28 DIAGNOSIS — Z1721 Progesterone receptor positive status: Secondary | ICD-10-CM | POA: Diagnosis not present

## 2023-07-28 DIAGNOSIS — E1022 Type 1 diabetes mellitus with diabetic chronic kidney disease: Secondary | ICD-10-CM

## 2023-07-28 DIAGNOSIS — N183 Chronic kidney disease, stage 3 unspecified: Secondary | ICD-10-CM | POA: Diagnosis not present

## 2023-07-28 DIAGNOSIS — C7951 Secondary malignant neoplasm of bone: Secondary | ICD-10-CM | POA: Insufficient documentation

## 2023-07-28 DIAGNOSIS — Z79899 Other long term (current) drug therapy: Secondary | ICD-10-CM | POA: Insufficient documentation

## 2023-07-28 DIAGNOSIS — C50022 Malignant neoplasm of nipple and areola, left male breast: Secondary | ICD-10-CM | POA: Diagnosis not present

## 2023-07-28 DIAGNOSIS — Z170 Estrogen receptor positive status [ER+]: Secondary | ICD-10-CM | POA: Diagnosis not present

## 2023-07-28 MED ORDER — DENOSUMAB 120 MG/1.7ML ~~LOC~~ SOLN
120.0000 mg | Freq: Once | SUBCUTANEOUS | Status: AC
  Administered 2023-07-28: 120 mg via SUBCUTANEOUS
  Filled 2023-07-28: qty 1.7

## 2023-07-28 NOTE — Progress Notes (Signed)
 Patient Care Team: Verdia Lombard, MD as PCP - General (Internal Medicine) Francyne Headland, MD as PCP - Cardiology (Cardiology) Glean Stephane BROCKS, RN as Oncology Nurse Navigator Tyree Nanetta SAILOR, RN as Oncology Nurse Navigator Odean Potts, MD as Consulting Physician (Hematology and Oncology) Lucila Norleen LABOR, RPH-CPP as Pharmacist (Hematology and Oncology)  DIAGNOSIS:  Encounter Diagnosis  Name Primary?   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 Ultrasound: 3.9 cm irregular hypoechoic mass retroareolar left breast, 4 abnormal left axillary lymph nodes with cortical thickening, mild right gynecomastia. Biopsy: Grade 2 IDC, LVI present, lymph node positive, ER 100%, PR 50%, Ki-67 30%, HER2 2+, FISH negative ratio 1.08    12/23/2022 Cancer Staging   Staging form: Breast, AJCC 8th Edition - Clinical: Stage IIA (cT2, cN1, cM0, G2, ER+, PR+, HER2-) - Signed by Odean Potts, MD on 12/23/2022 Stage prefix: Initial diagnosis Histologic grading system: 3 grade system   12/30/2022 Genetic Testing   Negative [facility] Common Hereditary Cancers +RNA Panel.  Report date is 12/30/2022.    The [facility] Common Hereditary Cancers + RNA Panel includes sequencing, deletion/duplication, and RNA analysis of the following 48 genes: APC, ATM, AXIN2, BAP1, BARD1, BMPR1A, BRCA1, BRCA2, BRIP1, CDH1, CDK4*, CDKN2A*, CHEK2, CTNNA1, DICER1, EPCAM* (del/dup only), FH, GREM1* (promoter dup analysis only), HOXB13*, KIT*, MBD4*, MEN1, MLH1, MSH2, MSH3, MSH6, MUTYH, NF1, NTHL1, PALB2, PDGFRA*, PMS2, POLD1, POLE, PTEN, RAD51C, RAD51D, SDHA (sequencing only), SDHB, SDHC, SDHD, SMAD4, SMARCA4, STK11, TP53, TSC1, TSC2, VHL.  *Genes without RNA analysis.       CHIEF COMPLIANT:   HISTORY OF PRESENT ILLNESS: Discussed the use of AI scribe software for clinical note transcription with the patient, who gave verbal consent to proceed.  History of Present Illness   Jesse Hoffman is an 88 year old male with metastatic cancer who presents for follow-up and review of recent test results.  He has metastatic cancer with significant improvement in cancer markers, as evidenced by a decrease in cancer antigen levels from 1849 five months ago to 145.9 a week ago. Recent imaging shows improvement in the tumor in the breast, lymph nodes under the arm, and the middle of the chest, as well as betterment of lung nodules. Bone metastases remain stable.  He has a history of chronic kidney issues, which have remained stable. There are benign cysts in both kidneys, noted in previous scans since 2018. He has a family history of kidney problems on his mother's side.  His hemoglobin levels have been stable, with recent readings around 11.3 to 11.4 over the past six months.  He feels generally well, with a good appetite and no pain, but experiences fatigue that limits physical activity. He has been gaining weight since August, when he was at his worst, and is now trying to manage his diet. He sleeps well and has no other significant complaints.         ALLERGIES:  is allergic to allopurinol.  MEDICATIONS:  Current Outpatient Medications  Medication Sig Dispense Refill   abemaciclib  (VERZENIO ) 50 MG tablet Take 1 tablet (50 mg total) by mouth 2 (two) times daily. 56 tablet 5   amLODipine  (NORVASC ) 5 MG tablet Take 5 mg by mouth daily.     atorvastatin  (  LIPITOR) 40 MG tablet Take 1 tablet (40 mg total) by mouth daily at 6 PM. Restart after 1 week, when Liver function tests normalize     calcium -vitamin D (OSCAL WITH D) 500-5 MG-MCG tablet Take 1 tablet by mouth daily. 30 tablet 5   Continuous Glucose Sensor (FREESTYLE LIBRE 3 PLUS SENSOR) MISC change  every 15 days     dextromethorphan-guaiFENesin (MUCINEX DM) 30-600 MG 12hr tablet Take 1 tablet by mouth daily.     FARXIGA 10 MG TABS tablet Take 10 mg by mouth daily.     guaiFENesin (MUCINEX) 600 MG 12 hr tablet Take 1,200 mg by mouth 2 (two) times daily.     HUMULIN N KWIKPEN 100 UNIT/ML KwikPen Now taking 4 units AM, 2 units PM per patient report     KERENDIA 10 MG TABS Take 10 mg by mouth daily.     losartan (COZAAR) 50 MG tablet Take 50 mg by mouth daily.     omeprazole  (PRILOSEC) 20 MG capsule Take 1 capsule (20 mg total) by mouth daily. 30 capsule 3   ONETOUCH VERIO test strip in the morning and at bedtime.     tamoxifen  (NOLVADEX ) 20 MG tablet Take 1 tablet (20 mg total) by mouth daily. 90 tablet 3   zolpidem  (AMBIEN ) 5 MG tablet Take 1 tablet (5 mg total) by mouth at bedtime as needed for sleep. 30 tablet 1   No current facility-administered medications for this visit.    PHYSICAL EXAMINATION: ECOG PERFORMANCE STATUS: 1 - Symptomatic but completely ambulatory  Vitals:   07/28/23 1032  BP: 130/63  Pulse: 71  Resp: 18  Temp: 98.6 F (37 C)  SpO2: 96%   Filed Weights   07/28/23 1032  Weight: 166 lb 6.4 oz (75.5 kg)    Physical Exam          (exam performed in the presence of a chaperone)  LABORATORY DATA:  I have reviewed the data as listed    Latest Ref Rng & Units 07/21/2023   10:41 AM 06/15/2023    9:20 AM 05/19/2023    9:29 AM  CMP  Glucose 70 - 99 mg/dL 864  848  802   BUN 8 - 23 mg/dL 28  29  26    Creatinine 0.61 - 1.24 mg/dL 7.83  7.88  7.90   Sodium 135 - 145 mmol/L 141  141  139   Potassium 3.5 - 5.1 mmol/L 4.3  4.2  4.4   Chloride 98 - 111 mmol/L 111  109  110   CO2 22 - 32 mmol/L 25  26  26    Calcium  8.9 - 10.3 mg/dL 8.8  8.7  8.9   Total Protein 6.5 - 8.1 g/dL 6.4  6.4  6.6   Total Bilirubin 0.0 - 1.2 mg/dL 0.7  0.6  0.6   Alkaline Phos 38 - 126 U/L 55  55  60   AST 15 - 41 U/L 14  17  14    ALT 0 - 44 U/L 11  14  9      Lab Results   Component Value Date   WBC 4.2 07/21/2023   HGB 11.3 (L) 07/21/2023   HCT 33.9 (L) 07/21/2023   MCV 98.0 07/21/2023   PLT 118 (L) 07/21/2023   NEUTROABS 2.4 07/21/2023    ASSESSMENT & PLAN:  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 Ultrasound:  3.9 cm irregular hypoechoic mass retroareolar left breast, 4 abnormal left axillary lymph nodes with cortical thickening, mild right gynecomastia. Biopsy: Grade 2 IDC, LVI present, lymph node positive, ER 100%, PR 50%, Ki-67 30%, HER2 2+, FISH negative ratio 1.08    CT CAP 01/11/2023: Left breast mass, left axillary lymph nodes, mediastinal and hilar lymphadenopathy, multiple bilateral lung nodules, sclerotic lesions throughout thoracic spine, pelvis and lumbar spine, multiple lesions left and right kidney (suggestive of solid kidney cancer)   Current treatment: Tamoxifen  with Verzenio  started 01/16/2023 [name] toxicities: Severe fatigue: Stable: We discussed taking oral B12 supplement. Continue with the same dosage of [name].   CT scan on 04/17/2023 interval improvement of left breast mass, left axillary mediastinal lymph nodes and bilateral pulmonary nodules.  Widespread bone metastases, indeterminate lesions lower poles of both kidneys complex cysts CT CAP 07/15/2023: Further interval improvement in the left breast mass as well as left axillary, mediastinal and pulmonary nodules, similar bone metastases  CA 27-29: 145.9 on 07/21/2023 (it was 1849 at 5 months ago)  Chronic kidney disease: I will send a referral to nephrology.  continue current therapy and rescan in 6 months  Orders Placed This Encounter  Procedures   Ambulatory referral to Nephrology    Referral Priority:   Routine    Referral Type:   Consultation    Referral Reason:   Specialty Services Required    Referred to Provider:    Tobie Gordy POUR, MD    Requested Specialty:   Nephrology    Number of Visits Requested:   1   The patient has a good understanding of the overall plan. he agrees with it. he will call with any problems that may develop before the next visit here. Total time spent: 30 mins including face to face time and time spent for planning, charting and co-ordination of care   Viinay K Erionna Strum, MD 07/28/23

## 2023-07-30 ENCOUNTER — Telehealth: Payer: Self-pay | Admitting: Hematology and Oncology

## 2023-07-30 NOTE — Telephone Encounter (Signed)
 Scheduled appointments per 2/4 los. Left VM with appointment details.

## 2023-08-13 ENCOUNTER — Other Ambulatory Visit: Payer: Self-pay

## 2023-08-13 NOTE — Progress Notes (Signed)
 Specialty Pharmacy Refill Coordination Note  Jesse Hoffman is a 88 y.o. male contacted today regarding refills of specialty medication(s) Abemaciclib [name])   Patient requested [name] at [facility] at [place] date: 08/20/23   Medication will be filled on 08/19/23.

## 2023-08-13 NOTE — Progress Notes (Signed)
 Specialty Pharmacy Ongoing Clinical Assessment Note  Jesse Hoffman is a 88 y.o. male who is being followed by the specialty pharmacy service for RxSp Oncology   Patient's specialty medication(s) reviewed today: Abemaciclib (VERZENIO)   Missed doses in the last 4 weeks: No data recorded  Patient/Caregiver did not have any additional questions or concerns.   Therapeutic benefit summary: Unable to assess   Adverse events/side effects summary: No adverse events/side effects   Patient's therapy is appropriate to: Continue    Goals Addressed             This Visit's Progress    Stabilization of disease       Patient is on track. Patient will maintain adherence         Follow up:  3 months  Bobette Mo Specialty Pharmacist

## 2023-08-14 ENCOUNTER — Other Ambulatory Visit (HOSPITAL_COMMUNITY): Payer: Self-pay

## 2023-08-20 ENCOUNTER — Other Ambulatory Visit (HOSPITAL_COMMUNITY): Payer: Self-pay

## 2023-09-07 DIAGNOSIS — E1122 Type 2 diabetes mellitus with diabetic chronic kidney disease: Secondary | ICD-10-CM | POA: Diagnosis not present

## 2023-09-07 DIAGNOSIS — E1121 Type 2 diabetes mellitus with diabetic nephropathy: Secondary | ICD-10-CM | POA: Diagnosis not present

## 2023-09-07 DIAGNOSIS — M1A0790 Idiopathic chronic gout, unspecified ankle and foot, without tophus (tophi): Secondary | ICD-10-CM | POA: Diagnosis not present

## 2023-09-07 DIAGNOSIS — E782 Mixed hyperlipidemia: Secondary | ICD-10-CM | POA: Diagnosis not present

## 2023-09-07 DIAGNOSIS — N1831 Chronic kidney disease, stage 3a: Secondary | ICD-10-CM | POA: Diagnosis not present

## 2023-09-10 ENCOUNTER — Other Ambulatory Visit: Payer: Self-pay

## 2023-09-10 NOTE — Progress Notes (Signed)
 Specialty Pharmacy Refill Coordination Note  Jesse Hoffman is a 88 y.o. male contacted today regarding refills of specialty medication(s) Abemaciclib [name])   Patient requested (Patient-Rptd) Pickup at [facility] at [facility] date: (Patient-Rptd) 09/30/23   Medication will be filled on 09/29/23.

## 2023-09-15 DIAGNOSIS — C50922 Malignant neoplasm of unspecified site of left male breast: Secondary | ICD-10-CM | POA: Diagnosis not present

## 2023-09-15 DIAGNOSIS — N1831 Chronic kidney disease, stage 3a: Secondary | ICD-10-CM | POA: Diagnosis not present

## 2023-09-15 DIAGNOSIS — E1121 Type 2 diabetes mellitus with diabetic nephropathy: Secondary | ICD-10-CM | POA: Diagnosis not present

## 2023-09-15 DIAGNOSIS — E782 Mixed hyperlipidemia: Secondary | ICD-10-CM | POA: Diagnosis not present

## 2023-09-15 DIAGNOSIS — I10 Essential (primary) hypertension: Secondary | ICD-10-CM | POA: Diagnosis not present

## 2023-09-15 DIAGNOSIS — E113293 Type 2 diabetes mellitus with mild nonproliferative diabetic retinopathy without macular edema, bilateral: Secondary | ICD-10-CM | POA: Diagnosis not present

## 2023-09-15 DIAGNOSIS — M1A0790 Idiopathic chronic gout, unspecified ankle and foot, without tophus (tophi): Secondary | ICD-10-CM | POA: Diagnosis not present

## 2023-09-15 DIAGNOSIS — C7951 Secondary malignant neoplasm of bone: Secondary | ICD-10-CM | POA: Diagnosis not present

## 2023-09-15 DIAGNOSIS — Z170 Estrogen receptor positive status [ER+]: Secondary | ICD-10-CM | POA: Diagnosis not present

## 2023-09-15 DIAGNOSIS — E1122 Type 2 diabetes mellitus with diabetic chronic kidney disease: Secondary | ICD-10-CM | POA: Diagnosis not present

## 2023-09-15 DIAGNOSIS — R011 Cardiac murmur, unspecified: Secondary | ICD-10-CM | POA: Diagnosis not present

## 2023-09-23 ENCOUNTER — Other Ambulatory Visit: Payer: Self-pay

## 2023-09-23 ENCOUNTER — Other Ambulatory Visit (HOSPITAL_COMMUNITY): Payer: Self-pay

## 2023-09-23 NOTE — Progress Notes (Signed)
 Patient called and stated he only has enough tablets on hand to last 4 days. Patient wants to pick up after lunch on Thursday 04/03 or Friday morning 04/04. Patient advised that he should receive a message when it's ready to be picked up.

## 2023-09-24 ENCOUNTER — Other Ambulatory Visit (HOSPITAL_COMMUNITY): Payer: Self-pay

## 2023-10-14 ENCOUNTER — Other Ambulatory Visit: Payer: Self-pay

## 2023-10-15 ENCOUNTER — Other Ambulatory Visit (HOSPITAL_COMMUNITY): Payer: Self-pay

## 2023-10-15 ENCOUNTER — Other Ambulatory Visit: Payer: Self-pay | Admitting: Hematology and Oncology

## 2023-10-15 ENCOUNTER — Other Ambulatory Visit: Payer: Self-pay

## 2023-10-15 ENCOUNTER — Other Ambulatory Visit: Payer: Self-pay | Admitting: Pharmacy Technician

## 2023-10-15 MED ORDER — ABEMACICLIB 50 MG PO TABS
50.0000 mg | ORAL_TABLET | Freq: Two times a day (BID) | ORAL | 5 refills | Status: DC
  Filled 2023-10-15 (×2): qty 56, 28d supply, fill #0
  Filled 2023-11-10: qty 56, 28d supply, fill #1
  Filled 2023-12-07 – 2023-12-09 (×3): qty 56, 28d supply, fill #2
  Filled 2024-01-06: qty 56, 28d supply, fill #3
  Filled 2024-02-04: qty 56, 28d supply, fill #4
  Filled 2024-03-02: qty 56, 28d supply, fill #5

## 2023-10-15 NOTE — Progress Notes (Signed)
 Specialty Pharmacy Refill Coordination Note  Jesse Hoffman is a 88 y.o. male contacted today regarding refills of specialty medication(s) Abemaciclib  (VERZENIO )   Patient requested (Patient-Rptd) Pickup at [facility] at [facility] date: (Patient-Rptd) 10/20/23   Medication will be filled on 10/19/23.   RR sent to MD

## 2023-10-19 ENCOUNTER — Other Ambulatory Visit: Payer: Self-pay

## 2023-10-20 ENCOUNTER — Inpatient Hospital Stay: Payer: Medicare Other | Attending: Hematology and Oncology

## 2023-10-20 ENCOUNTER — Inpatient Hospital Stay: Payer: Medicare Other | Admitting: Hematology and Oncology

## 2023-10-20 ENCOUNTER — Inpatient Hospital Stay: Payer: Medicare Other

## 2023-10-20 VITALS — BP 128/52 | HR 67 | Temp 97.8°F | Resp 18 | Ht 71.0 in | Wt 171.7 lb

## 2023-10-20 DIAGNOSIS — C50022 Malignant neoplasm of nipple and areola, left male breast: Secondary | ICD-10-CM | POA: Insufficient documentation

## 2023-10-20 DIAGNOSIS — Z1721 Progesterone receptor positive status: Secondary | ICD-10-CM | POA: Diagnosis not present

## 2023-10-20 DIAGNOSIS — Z1732 Human epidermal growth factor receptor 2 negative status: Secondary | ICD-10-CM | POA: Diagnosis not present

## 2023-10-20 DIAGNOSIS — C50922 Malignant neoplasm of unspecified site of left male breast: Secondary | ICD-10-CM

## 2023-10-20 DIAGNOSIS — C7951 Secondary malignant neoplasm of bone: Secondary | ICD-10-CM | POA: Insufficient documentation

## 2023-10-20 DIAGNOSIS — Z170 Estrogen receptor positive status [ER+]: Secondary | ICD-10-CM

## 2023-10-20 LAB — CBC WITH DIFFERENTIAL (CANCER CENTER ONLY)
Abs Immature Granulocytes: 0.01 10*3/uL (ref 0.00–0.07)
Basophils Absolute: 0 10*3/uL (ref 0.0–0.1)
Basophils Relative: 1 %
Eosinophils Absolute: 0.1 10*3/uL (ref 0.0–0.5)
Eosinophils Relative: 3 %
HCT: 32.4 % — ABNORMAL LOW (ref 39.0–52.0)
Hemoglobin: 11.5 g/dL — ABNORMAL LOW (ref 13.0–17.0)
Immature Granulocytes: 0 %
Lymphocytes Relative: 33 %
Lymphs Abs: 1.2 10*3/uL (ref 0.7–4.0)
MCH: 32.8 pg (ref 26.0–34.0)
MCHC: 35.5 g/dL (ref 30.0–36.0)
MCV: 92.3 fL (ref 80.0–100.0)
Monocytes Absolute: 0.3 10*3/uL (ref 0.1–1.0)
Monocytes Relative: 7 %
Neutro Abs: 2.1 10*3/uL (ref 1.7–7.7)
Neutrophils Relative %: 56 %
Platelet Count: 130 10*3/uL — ABNORMAL LOW (ref 150–400)
RBC: 3.51 MIL/uL — ABNORMAL LOW (ref 4.22–5.81)
RDW: 12.5 % (ref 11.5–15.5)
WBC Count: 3.7 10*3/uL — ABNORMAL LOW (ref 4.0–10.5)
nRBC: 0 % (ref 0.0–0.2)

## 2023-10-20 LAB — CMP (CANCER CENTER ONLY)
ALT: 11 U/L (ref 0–44)
AST: 15 U/L (ref 15–41)
Albumin: 3.9 g/dL (ref 3.5–5.0)
Alkaline Phosphatase: 41 U/L (ref 38–126)
Anion gap: 6 (ref 5–15)
BUN: 28 mg/dL — ABNORMAL HIGH (ref 8–23)
CO2: 25 mmol/L (ref 22–32)
Calcium: 8.7 mg/dL — ABNORMAL LOW (ref 8.9–10.3)
Chloride: 107 mmol/L (ref 98–111)
Creatinine: 2.01 mg/dL — ABNORMAL HIGH (ref 0.61–1.24)
GFR, Estimated: 32 mL/min — ABNORMAL LOW (ref >60)
Glucose, Bld: 142 mg/dL — ABNORMAL HIGH (ref 70–99)
Potassium: 4.5 mmol/L (ref 3.5–5.1)
Sodium: 138 mmol/L (ref 135–145)
Total Bilirubin: 0.7 mg/dL (ref 0.0–1.2)
Total Protein: 6.6 g/dL (ref 6.5–8.1)

## 2023-10-20 MED ORDER — DENOSUMAB 120 MG/1.7ML ~~LOC~~ SOLN
120.0000 mg | Freq: Once | SUBCUTANEOUS | Status: AC
  Administered 2023-10-20: 120 mg via SUBCUTANEOUS
  Filled 2023-10-20: qty 1.7

## 2023-10-20 NOTE — Assessment & Plan Note (Signed)
 12/09/2022: mammogram detected asymmetry/mass retroareolar left breast with left nipple retraction and skin thickening. Ultrasound: 3.9 cm irregular hypoechoic mass retroareolar left breast, 4 abnormal left axillary lymph nodes with cortical thickening, mild right gynecomastia. Biopsy: Grade 2 IDC, LVI present, lymph node positive, ER 100%, PR 50%, Ki-67 30%, HER2 2+, FISH negative ratio 1.08    CT CAP 01/11/2023: Left breast mass, left axillary lymph nodes, mediastinal and hilar lymphadenopathy, multiple bilateral lung nodules, sclerotic lesions throughout thoracic spine, pelvis and lumbar spine, multiple lesions left and right kidney (suggestive of solid kidney cancer)   Current treatment: Tamoxifen  with Verzenio  started 01/16/2023 [name] toxicities: Severe fatigue: Stable: We discussed taking oral B12 supplement. Continue with the same dosage of [name].   CT scan on 04/17/2023 interval improvement of left breast mass, left axillary mediastinal lymph nodes and bilateral pulmonary nodules.  Widespread bone metastases, indeterminate lesions lower poles of both kidneys complex cysts CT CAP 07/15/2023: Further interval improvement in the left breast mass as well as left axillary, mediastinal and pulmonary nodules, similar bone metastases   CA 27-29: 145.9 on 07/21/2023 (it was 1849 at 5 months ago)   Chronic kidney disease: I will send a referral to nephrology.   continue current therapy and rescan in August 2025

## 2023-10-20 NOTE — Progress Notes (Signed)
 Patient Care Team: Virgle Grime, MD as PCP - General (Internal Medicine) Luana Rumple, MD as PCP - Cardiology (Cardiology) Auther Bo, RN as Oncology Nurse Navigator Alane Hsu, RN as Oncology Nurse Navigator Cameron Cea, MD as Consulting Physician (Hematology and Oncology) Althea Atkinson, RPH-CPP as Pharmacist (Hematology and Oncology)  DIAGNOSIS:  Encounter Diagnosis  Name Primary?   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 Ultrasound: 3.9 cm irregular hypoechoic mass retroareolar left breast, 4 abnormal left axillary lymph nodes with cortical thickening, mild right gynecomastia. Biopsy: Grade 2 IDC, LVI present, lymph node positive, ER 100%, PR 50%, Ki-67 30%, HER2 2+, FISH negative ratio 1.08    12/23/2022 Cancer Staging   Staging form: Breast, AJCC 8th Edition - Clinical: Stage IIA (cT2, cN1, cM0, G2, ER+, PR+, HER2-) - Signed by Cameron Cea, MD on 12/23/2022 Stage prefix: Initial diagnosis Histologic grading system: 3 grade system   12/30/2022 Genetic Testing   Negative [facility] Common Hereditary Cancers +RNA Panel.  Report date is 12/30/2022.    The [facility] Common Hereditary Cancers + RNA Panel includes sequencing, deletion/duplication, and RNA analysis of the following 48 genes: APC, ATM, AXIN2, BAP1, BARD1, BMPR1A, BRCA1, BRCA2, BRIP1, CDH1, CDK4*, CDKN2A*, CHEK2, CTNNA1, DICER1, EPCAM* (del/dup only), FH, GREM1* (promoter dup analysis only), HOXB13*, KIT*, MBD4*, MEN1, MLH1, MSH2, MSH3, MSH6, MUTYH, NF1, NTHL1, PALB2, PDGFRA*, PMS2, POLD1, POLE, PTEN, RAD51C, RAD51D, SDHA (sequencing only), SDHB, SDHC, SDHD, SMAD4, SMARCA4,  STK11, TP53, TSC1, TSC2, VHL.  *Genes without RNA analysis.      CHIEF COMPLIANT: Follow-up on [name] and tamoxifen   HISTORY OF PRESENT ILLNESS:  History of Present Illness Jesse Hoffman "Jesse Hoffman" is an 88 year old male who presents for follow-up regarding his tumor markers and treatment tolerance.  His tumor markers are decreasing. He experiences significant fatigue, which he attributes to both his treatment and age. Recent blood work shows a white blood cell count of 3.7, hemoglobin of 11.5, and platelets at 130, all stable or improved. Kidney function, with a creatinine level of 2, is stable, and liver function and electrolytes are normal. He is currently taking [place] and manages symptoms with over-the-counter allergy medications like Zyrtec  or Claritin as needed. Joint stiffness is present in the knees and hips, worse in the morning, but does not affect the arms. He has gained weight but is not concerned. A cough, attributed to allergies, is not constant and is accompanied by throat drainage, with no associated fever.     ALLERGIES:  is allergic to allopurinol.  MEDICATIONS:  Current Outpatient Medications  Medication Sig Dispense Refill   abemaciclib  (VERZENIO ) 50 MG tablet Take 1 tablet (50 mg total) by mouth 2 (two) times daily. 56 tablet 5   amLODipine (NORVASC) 5 MG tablet Take 5 mg by mouth daily.     atorvastatin  (LIPITOR) 40 MG tablet Take 1 tablet (40 mg total) by mouth daily at 6 PM. Restart after 1 week, when Liver function tests normalize     calcium -vitamin D (OSCAL WITH D) 500-5 MG-MCG tablet Take 1 tablet by mouth daily. 30 tablet 5   Continuous Glucose Sensor (FREESTYLE LIBRE 3 PLUS SENSOR) MISC change every 15 days     dextromethorphan-guaiFENesin (  MUCINEX DM) 30-600 MG 12hr tablet Take 1 tablet by mouth daily.     FARXIGA 10 MG TABS tablet Take 10 mg by mouth daily.     guaiFENesin (MUCINEX) 600 MG 12 hr tablet Take 1,200 mg by mouth 2 (two) times daily.      HUMULIN N KWIKPEN 100 UNIT/ML KwikPen Now taking 4 units AM, 2 units PM per patient report     KERENDIA 10 MG TABS Take 10 mg by mouth daily.     losartan (COZAAR) 50 MG tablet Take 50 mg by mouth daily.     omeprazole  (PRILOSEC) 20 MG capsule Take 1 capsule (20 mg total) by mouth daily. 30 capsule 3   ONETOUCH VERIO test strip in the morning and at bedtime.     tamoxifen  (NOLVADEX ) 20 MG tablet Take 1 tablet (20 mg total) by mouth daily. 90 tablet 3   zolpidem  (AMBIEN ) 5 MG tablet Take 1 tablet (5 mg total) by mouth at bedtime as needed for sleep. 30 tablet 1   No current facility-administered medications for this visit.    PHYSICAL EXAMINATION: ECOG PERFORMANCE STATUS: 1 - Symptomatic but completely ambulatory  Vitals:   10/20/23 1120  BP: (!) 128/52  Pulse: 67  Resp: 18  Temp: 97.8 F (36.6 C)  SpO2: 100%   Filed Weights   10/20/23 1120  Weight: 171 lb 11.2 oz (77.9 kg)     LABORATORY DATA:  I have reviewed the data as listed    Latest Ref Rng & Units 10/20/2023   10:44 AM 07/21/2023   10:41 AM 06/15/2023    9:20 AM  CMP  Glucose 70 - 99 mg/dL 440  102  725   BUN 8 - 23 mg/dL 28  28  29    Creatinine 0.61 - 1.24 mg/dL 3.66  4.40  3.47   Sodium 135 - 145 mmol/L 138  141  141   Potassium 3.5 - 5.1 mmol/L 4.5  4.3  4.2   Chloride 98 - 111 mmol/L 107  111  109   CO2 22 - 32 mmol/L 25  25  26    Calcium  8.9 - 10.3 mg/dL 8.7  8.8  8.7   Total Protein 6.5 - 8.1 g/dL 6.6  6.4  6.4   Total Bilirubin 0.0 - 1.2 mg/dL 0.7  0.7  0.6   Alkaline Phos 38 - 126 U/L 41  55  55   AST 15 - 41 U/L 15  14  17    ALT 0 - 44 U/L 11  11  14      Lab Results  Component Value Date   WBC 3.7 (L) 10/20/2023   HGB 11.5 (L) 10/20/2023   HCT 32.4 (L) 10/20/2023   MCV 92.3 10/20/2023   PLT 130 (L) 10/20/2023   NEUTROABS 2.1 10/20/2023    ASSESSMENT & PLAN:  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 Ultrasound: 3.9 cm irregular hypoechoic mass retroareolar left breast, 4 abnormal left axillary lymph nodes with cortical thickening, mild right gynecomastia. Biopsy: Grade 2 IDC, LVI present, lymph node positive, ER 100%, PR 50%, Ki-67 30%, HER2 2+, FISH negative ratio 1.08    CT CAP 01/11/2023: Left breast mass, left axillary lymph nodes, mediastinal and hilar lymphadenopathy, multiple bilateral lung nodules, sclerotic lesions throughout thoracic spine, pelvis and lumbar spine, multiple lesions left and right kidney (suggestive of solid kidney cancer)  Current treatment: Tamoxifen  with Verzenio  started 01/16/2023 [name] toxicities: Severe fatigue: Stable: We discussed taking oral B12 supplement. Continue with the same dosage of [name].   CT scan on 04/17/2023 interval improvement of left breast mass, left axillary mediastinal lymph nodes and bilateral pulmonary nodules.  Widespread bone metastases, indeterminate lesions lower poles of both kidneys complex cysts CT CAP 07/15/2023: Further interval improvement in the left breast mass as well as left axillary, mediastinal and pulmonary nodules, similar bone metastases   CA 27-29: 145.9 on 07/21/2023 (it was 1849 at 5 months ago)   Chronic kidney disease: referral to nephrology.   continue current therapy and rescan in July 2025 Follow-up after that to discuss results ------------------------------------- Assessment and Plan Assessment & Plan Metastatic breast cancer Metastatic breast cancer with estrogen receptor positivity. Tumor markers decreased from 1800 to 145, indicating positive treatment response. Current treatment with Abemaciclib  is well-tolerated. No evidence of disease progression. Awaiting updated tumor marker results. - Continue Abemaciclib  (Verzenio ) 50 MG oral bid. - Schedule scans in three months to monitor disease status. - Review tumor marker results when  available.  Fatigue Fatigue likely related to cancer treatment and possibly age-related factors.  Chronic kidney disease, unspecified Chronic kidney disease, stage 3, likely secondary to type 1 diabetes mellitus. Kidney function well-managed with creatinine level of 2.0. Verzenio  not contributing to kidney issues. - Continue current management and monitoring of kidney function.  Joint stiffness Joint stiffness, particularly in knees and hips, possibly related to Tamoxifen  use. Symptoms improve with movement. - Recommend stretching exercises before getting out of bed to alleviate stiffness.  Allergic rhinitis Intermittent cough and post-nasal drainage likely due to seasonal allergies. Symptoms manageable with antihistamines if needed. - Consider taking Zyrtec  or Claritin for a couple of weeks if symptoms become bothersome.      No orders of the defined types were placed in this encounter.  The patient has a good understanding of the overall plan. he agrees with it. he will call with any problems that may develop before the next visit here. Total time spent: 30 mins including face to face time and time spent for planning, charting and co-ordination of care   Margert Sheerer, MD 10/20/23

## 2023-10-21 LAB — CANCER ANTIGEN 27.29: CA 27.29: 82.6 U/mL — ABNORMAL HIGH (ref 0.0–38.6)

## 2023-10-26 DIAGNOSIS — D0462 Carcinoma in situ of skin of left upper limb, including shoulder: Secondary | ICD-10-CM | POA: Diagnosis not present

## 2023-10-26 DIAGNOSIS — L820 Inflamed seborrheic keratosis: Secondary | ICD-10-CM | POA: Diagnosis not present

## 2023-10-26 DIAGNOSIS — D225 Melanocytic nevi of trunk: Secondary | ICD-10-CM | POA: Diagnosis not present

## 2023-10-26 DIAGNOSIS — Z08 Encounter for follow-up examination after completed treatment for malignant neoplasm: Secondary | ICD-10-CM | POA: Diagnosis not present

## 2023-10-26 DIAGNOSIS — L538 Other specified erythematous conditions: Secondary | ICD-10-CM | POA: Diagnosis not present

## 2023-10-26 DIAGNOSIS — D492 Neoplasm of unspecified behavior of bone, soft tissue, and skin: Secondary | ICD-10-CM | POA: Diagnosis not present

## 2023-10-26 DIAGNOSIS — L821 Other seborrheic keratosis: Secondary | ICD-10-CM | POA: Diagnosis not present

## 2023-10-26 DIAGNOSIS — Z85828 Personal history of other malignant neoplasm of skin: Secondary | ICD-10-CM | POA: Diagnosis not present

## 2023-10-26 DIAGNOSIS — L814 Other melanin hyperpigmentation: Secondary | ICD-10-CM | POA: Diagnosis not present

## 2023-10-26 DIAGNOSIS — L570 Actinic keratosis: Secondary | ICD-10-CM | POA: Diagnosis not present

## 2023-10-29 DIAGNOSIS — H43813 Vitreous degeneration, bilateral: Secondary | ICD-10-CM | POA: Diagnosis not present

## 2023-10-29 DIAGNOSIS — H52203 Unspecified astigmatism, bilateral: Secondary | ICD-10-CM | POA: Diagnosis not present

## 2023-10-29 DIAGNOSIS — E113291 Type 2 diabetes mellitus with mild nonproliferative diabetic retinopathy without macular edema, right eye: Secondary | ICD-10-CM | POA: Diagnosis not present

## 2023-10-29 DIAGNOSIS — Z961 Presence of intraocular lens: Secondary | ICD-10-CM | POA: Diagnosis not present

## 2023-10-29 DIAGNOSIS — H35372 Puckering of macula, left eye: Secondary | ICD-10-CM | POA: Diagnosis not present

## 2023-11-10 ENCOUNTER — Other Ambulatory Visit (HOSPITAL_COMMUNITY): Payer: Self-pay

## 2023-11-10 NOTE — Progress Notes (Signed)
 Specialty Pharmacy Ongoing Clinical Assessment Note  NEIMAN ROOTS is a 88 y.o. male who is being followed by the specialty pharmacy service for RxSp Oncology   Patient's specialty medication(s) reviewed today: Abemaciclib  (VERZENIO )   Missed doses in the last 4 weeks: 0   Patient/Caregiver did not have any additional questions or concerns.   Therapeutic benefit summary: Patient is achieving benefit   Adverse events/side effects summary: Experienced adverse events/side effects (fatigue, tolerable)   Patient's therapy is appropriate to: Continue    Goals Addressed             This Visit's Progress    Slow Disease Progression   On track    Patient is on track. Patient will maintain adherence. CA 27.29 is showing a good response to therapy as it continues to decline, most recently it was 82.6 U/mL on 10/20/23.          Follow up: 6 months  Malachi Screws Specialty Pharmacist

## 2023-11-10 NOTE — Progress Notes (Signed)
 Specialty Pharmacy Refill Coordination Note  Jesse Hoffman is a 88 y.o. male contacted today regarding refills of specialty medication(s) Abemaciclib  (VERZENIO )   Patient requested [name] at [facility] at [place] date: 11/19/23   Medication will be filled on 11/18/23.

## 2023-11-11 ENCOUNTER — Other Ambulatory Visit (HOSPITAL_COMMUNITY): Payer: Self-pay

## 2023-12-04 ENCOUNTER — Other Ambulatory Visit: Payer: Self-pay

## 2023-12-07 ENCOUNTER — Other Ambulatory Visit: Payer: Self-pay

## 2023-12-09 ENCOUNTER — Other Ambulatory Visit: Payer: Self-pay

## 2023-12-09 ENCOUNTER — Other Ambulatory Visit: Payer: Self-pay | Admitting: Pharmacy Technician

## 2023-12-09 NOTE — Progress Notes (Signed)
 Specialty Pharmacy Refill Coordination Note  Jesse Hoffman is a 88 y.o. male contacted today regarding refills of specialty medication(s) Abemaciclib  (VERZENIO )   Patient requested [name] at [facility] at [place] date: 12/18/23   Medication will be filled on 12/17/23.

## 2023-12-17 ENCOUNTER — Other Ambulatory Visit: Payer: Self-pay

## 2023-12-23 DIAGNOSIS — D0462 Carcinoma in situ of skin of left upper limb, including shoulder: Secondary | ICD-10-CM | POA: Diagnosis not present

## 2023-12-23 DIAGNOSIS — R202 Paresthesia of skin: Secondary | ICD-10-CM | POA: Diagnosis not present

## 2024-01-04 ENCOUNTER — Other Ambulatory Visit: Payer: Self-pay | Admitting: Hematology and Oncology

## 2024-01-05 DIAGNOSIS — E1122 Type 2 diabetes mellitus with diabetic chronic kidney disease: Secondary | ICD-10-CM | POA: Diagnosis not present

## 2024-01-05 DIAGNOSIS — N1831 Chronic kidney disease, stage 3a: Secondary | ICD-10-CM | POA: Diagnosis not present

## 2024-01-05 DIAGNOSIS — E1121 Type 2 diabetes mellitus with diabetic nephropathy: Secondary | ICD-10-CM | POA: Diagnosis not present

## 2024-01-05 DIAGNOSIS — M1A0790 Idiopathic chronic gout, unspecified ankle and foot, without tophus (tophi): Secondary | ICD-10-CM | POA: Diagnosis not present

## 2024-01-05 DIAGNOSIS — Z170 Estrogen receptor positive status [ER+]: Secondary | ICD-10-CM | POA: Diagnosis not present

## 2024-01-06 ENCOUNTER — Other Ambulatory Visit: Payer: Self-pay

## 2024-01-06 ENCOUNTER — Encounter (INDEPENDENT_AMBULATORY_CARE_PROVIDER_SITE_OTHER): Payer: Self-pay

## 2024-01-06 ENCOUNTER — Other Ambulatory Visit: Payer: Self-pay | Admitting: Pharmacy Technician

## 2024-01-06 NOTE — Progress Notes (Signed)
 Specialty Pharmacy Refill Coordination Note  Jesse Hoffman is a 88 y.o. male contacted today regarding refills of specialty medication(s) Abemaciclib  (VERZENIO )   Patient requested [name] at [facility] at [place] date: 01/11/24   Medication will be filled on 01/11/24.

## 2024-01-11 ENCOUNTER — Telehealth: Payer: Self-pay

## 2024-01-11 ENCOUNTER — Other Ambulatory Visit: Payer: Self-pay

## 2024-01-11 DIAGNOSIS — C7951 Secondary malignant neoplasm of bone: Secondary | ICD-10-CM

## 2024-01-11 DIAGNOSIS — Z170 Estrogen receptor positive status [ER+]: Secondary | ICD-10-CM

## 2024-01-11 NOTE — Telephone Encounter (Signed)
-----   Message from Devan  Mitchell sent at 01/11/2024  1:33 PM EDT ----- Regarding: Need lab for Xgeva  appt 7/29? Good afternoon,  This pt is scheduled to receive Xgeva  on 7/29 but doesn't have a lab appt for that day. Last labs were from April. Please order and schedule labs as appropriate.  Thank you,  Devan

## 2024-01-11 NOTE — Telephone Encounter (Signed)
 Pt agreed to come in at 0930 for lab 7/29. Orders entered per MD and scheduled.

## 2024-01-12 ENCOUNTER — Encounter (HOSPITAL_COMMUNITY): Payer: Self-pay

## 2024-01-12 ENCOUNTER — Other Ambulatory Visit: Payer: Self-pay | Admitting: Hematology and Oncology

## 2024-01-12 ENCOUNTER — Ambulatory Visit (HOSPITAL_COMMUNITY)
Admission: RE | Admit: 2024-01-12 | Discharge: 2024-01-12 | Disposition: A | Discharge status: Home / Self Care | Source: Ambulatory Visit | Attending: Hematology and Oncology | Admitting: Hematology and Oncology

## 2024-01-12 DIAGNOSIS — C50922 Malignant neoplasm of unspecified site of left male breast: Secondary | ICD-10-CM | POA: Diagnosis not present

## 2024-01-12 DIAGNOSIS — Z170 Estrogen receptor positive status [ER+]: Secondary | ICD-10-CM | POA: Insufficient documentation

## 2024-01-12 DIAGNOSIS — R011 Cardiac murmur, unspecified: Secondary | ICD-10-CM | POA: Diagnosis not present

## 2024-01-12 DIAGNOSIS — E1121 Type 2 diabetes mellitus with diabetic nephropathy: Secondary | ICD-10-CM | POA: Diagnosis not present

## 2024-01-12 DIAGNOSIS — N183 Chronic kidney disease, stage 3 unspecified: Secondary | ICD-10-CM | POA: Insufficient documentation

## 2024-01-12 DIAGNOSIS — N1831 Chronic kidney disease, stage 3a: Secondary | ICD-10-CM | POA: Diagnosis not present

## 2024-01-12 DIAGNOSIS — I7121 Aneurysm of the ascending aorta, without rupture: Secondary | ICD-10-CM | POA: Diagnosis not present

## 2024-01-12 DIAGNOSIS — E1022 Type 1 diabetes mellitus with diabetic chronic kidney disease: Secondary | ICD-10-CM | POA: Diagnosis not present

## 2024-01-12 DIAGNOSIS — E782 Mixed hyperlipidemia: Secondary | ICD-10-CM | POA: Diagnosis not present

## 2024-01-12 DIAGNOSIS — R590 Localized enlarged lymph nodes: Secondary | ICD-10-CM | POA: Diagnosis not present

## 2024-01-12 DIAGNOSIS — C7951 Secondary malignant neoplasm of bone: Secondary | ICD-10-CM | POA: Diagnosis not present

## 2024-01-12 DIAGNOSIS — M1A0790 Idiopathic chronic gout, unspecified ankle and foot, without tophus (tophi): Secondary | ICD-10-CM | POA: Diagnosis not present

## 2024-01-12 DIAGNOSIS — E119 Type 2 diabetes mellitus without complications: Secondary | ICD-10-CM | POA: Insufficient documentation

## 2024-01-12 DIAGNOSIS — E1122 Type 2 diabetes mellitus with diabetic chronic kidney disease: Secondary | ICD-10-CM | POA: Diagnosis not present

## 2024-01-12 DIAGNOSIS — I10 Essential (primary) hypertension: Secondary | ICD-10-CM | POA: Diagnosis not present

## 2024-01-12 LAB — POCT I-STAT CREATININE: Creatinine, Ser: 2.2 mg/dL — ABNORMAL HIGH (ref 0.61–1.24)

## 2024-01-19 ENCOUNTER — Inpatient Hospital Stay: Attending: Hematology and Oncology | Admitting: Hematology and Oncology

## 2024-01-19 ENCOUNTER — Inpatient Hospital Stay

## 2024-01-19 ENCOUNTER — Other Ambulatory Visit: Payer: Self-pay

## 2024-01-19 ENCOUNTER — Other Ambulatory Visit: Payer: Self-pay | Admitting: *Deleted

## 2024-01-19 VITALS — BP 129/50 | HR 81 | Temp 97.9°F | Resp 16 | Ht 71.0 in | Wt 172.7 lb

## 2024-01-19 DIAGNOSIS — Z79899 Other long term (current) drug therapy: Secondary | ICD-10-CM | POA: Insufficient documentation

## 2024-01-19 DIAGNOSIS — C50022 Malignant neoplasm of nipple and areola, left male breast: Secondary | ICD-10-CM | POA: Insufficient documentation

## 2024-01-19 DIAGNOSIS — Z170 Estrogen receptor positive status [ER+]: Secondary | ICD-10-CM | POA: Diagnosis not present

## 2024-01-19 DIAGNOSIS — Z1721 Progesterone receptor positive status: Secondary | ICD-10-CM | POA: Insufficient documentation

## 2024-01-19 DIAGNOSIS — Z1732 Human epidermal growth factor receptor 2 negative status: Secondary | ICD-10-CM | POA: Diagnosis not present

## 2024-01-19 DIAGNOSIS — C50922 Malignant neoplasm of unspecified site of left male breast: Secondary | ICD-10-CM

## 2024-01-19 DIAGNOSIS — C7951 Secondary malignant neoplasm of bone: Secondary | ICD-10-CM

## 2024-01-19 LAB — CMP (CANCER CENTER ONLY)
ALT: 9 U/L (ref 0–44)
AST: 13 U/L — ABNORMAL LOW (ref 15–41)
Albumin: 3.7 g/dL (ref 3.5–5.0)
Alkaline Phosphatase: 44 U/L (ref 38–126)
Anion gap: 5 (ref 5–15)
BUN: 23 mg/dL (ref 8–23)
CO2: 25 mmol/L (ref 22–32)
Calcium: 7.9 mg/dL — ABNORMAL LOW (ref 8.9–10.3)
Chloride: 111 mmol/L (ref 98–111)
Creatinine: 2.01 mg/dL — ABNORMAL HIGH (ref 0.61–1.24)
GFR, Estimated: 32 mL/min — ABNORMAL LOW (ref >60)
Glucose, Bld: 175 mg/dL — ABNORMAL HIGH (ref 70–99)
Potassium: 4.5 mmol/L (ref 3.5–5.1)
Sodium: 141 mmol/L (ref 135–145)
Total Bilirubin: 0.5 mg/dL (ref 0.0–1.2)
Total Protein: 6.8 g/dL (ref 6.5–8.1)

## 2024-01-19 LAB — CBC WITH DIFFERENTIAL (CANCER CENTER ONLY)
Abs Immature Granulocytes: 0.02 K/uL (ref 0.00–0.07)
Basophils Absolute: 0.1 K/uL (ref 0.0–0.1)
Basophils Relative: 1 %
Eosinophils Absolute: 0.1 K/uL (ref 0.0–0.5)
Eosinophils Relative: 2 %
HCT: 32.7 % — ABNORMAL LOW (ref 39.0–52.0)
Hemoglobin: 11 g/dL — ABNORMAL LOW (ref 13.0–17.0)
Immature Granulocytes: 0 %
Lymphocytes Relative: 21 %
Lymphs Abs: 1.3 K/uL (ref 0.7–4.0)
MCH: 32.3 pg (ref 26.0–34.0)
MCHC: 33.6 g/dL (ref 30.0–36.0)
MCV: 95.9 fL (ref 80.0–100.0)
Monocytes Absolute: 0.4 K/uL (ref 0.1–1.0)
Monocytes Relative: 6 %
Neutro Abs: 4.3 K/uL (ref 1.7–7.7)
Neutrophils Relative %: 70 %
Platelet Count: 133 K/uL — ABNORMAL LOW (ref 150–400)
RBC: 3.41 MIL/uL — ABNORMAL LOW (ref 4.22–5.81)
RDW: 12.9 % (ref 11.5–15.5)
WBC Count: 6.1 K/uL (ref 4.0–10.5)
nRBC: 0 % (ref 0.0–0.2)

## 2024-01-19 MED ORDER — DENOSUMAB 120 MG/1.7ML ~~LOC~~ SOLN
120.0000 mg | Freq: Once | SUBCUTANEOUS | Status: AC
  Administered 2024-01-19: 120 mg via SUBCUTANEOUS
  Filled 2024-01-19: qty 1.7

## 2024-01-19 NOTE — Progress Notes (Signed)
 Patient here for Xgeva  injection.  Calcium  7.9.  Ok to give per Dr. Gudena.  Advised for patient to take two tabs daily

## 2024-01-19 NOTE — Progress Notes (Signed)
 Patient Care Team: Verdia Lombard, MD as PCP - General (Internal Medicine) Francyne Headland, MD as PCP - Cardiology (Cardiology) Glean Stephane BROCKS, RN (Inactive) as Oncology Nurse Navigator Tyree Nanetta SAILOR, RN as Oncology Nurse Navigator Odean Potts, MD as Consulting Physician (Hematology and Oncology) Lucila Norleen LABOR, RPH-CPP as Pharmacist (Hematology and Oncology)  DIAGNOSIS:  Encounter Diagnosis  Name Primary?   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 Ultrasound: 3.9 cm irregular hypoechoic mass retroareolar left breast, 4 abnormal left axillary lymph nodes with cortical thickening, mild right gynecomastia. Biopsy: Grade 2 IDC, LVI present, lymph node positive, ER 100%, PR 50%, Ki-67 30%, HER2 2+, FISH negative ratio 1.08    12/23/2022 Cancer Staging   Staging form: Breast, AJCC 8th Edition - Clinical: Stage IIA (cT2, cN1, cM0, G2, ER+, PR+, HER2-) - Signed by Odean Potts, MD on 12/23/2022 Stage prefix: Initial diagnosis Histologic grading system: 3 grade system   12/30/2022 Genetic Testing   Negative [facility] Common Hereditary Cancers +RNA Panel.  Report date is 12/30/2022.    The [facility] Common Hereditary Cancers + RNA Panel includes sequencing, deletion/duplication, and RNA analysis of the following 48 genes: APC, ATM, AXIN2, BAP1, BARD1, BMPR1A, BRCA1, BRCA2, BRIP1, CDH1, CDK4*, CDKN2A*, CHEK2, CTNNA1, DICER1, EPCAM* (del/dup only), FH, GREM1* (promoter dup analysis only), HOXB13*, KIT*, MBD4*, MEN1, MLH1, MSH2, MSH3, MSH6, MUTYH, NF1, NTHL1, PALB2, PDGFRA*, PMS2, POLD1, POLE, PTEN, RAD51C, RAD51D, SDHA (sequencing only), SDHB, SDHC, SDHD, SMAD4,  SMARCA4, STK11, TP53, TSC1, TSC2, VHL.  *Genes without RNA analysis.      CHIEF COMPLIANT: Follow-up on [name] with letrozole, complaining of fatigue, recent scans  HISTORY OF PRESENT ILLNESS:  History of Present Illness Jesse Hoffman is an 88 year old male with metastatic cancer who presents for follow-up regarding his treatment with Verzenio .  He has been on Verzenio  for about a year, with significant improvement in blood markers, which were down to the 80s during the last check. He tolerates the medication well, with fatigue as the primary side effect. He feels exhausted after walking about a block and needs to sit down, impacting his ability to perform yard work. No nausea, diarrhea, or need for Imodium.  Joint pain is present, particularly in his right leg upon waking, managed with Tylenol , taking one in the morning and one at night. This regimen helps alleviate the pain, although it tends to return by the end of the day.  He has a persistent cough, which he does not attribute to his chest, and mentions stable scar tissue. He recalls being told about a lymph node, a breast mass, and a small aneurysm of 4.4 cm.  His creatinine level is 2.2, slightly higher than his usual 2.0-2.1, which led to a decision against using contrast during his scan. A lab error occurred where his tumor marker was not drawn, which he usually receives a day or two after his visit.     ALLERGIES:  is allergic to allopurinol.  MEDICATIONS:  Current Outpatient Medications  Medication Sig Dispense Refill   abemaciclib  (VERZENIO ) 50 MG tablet Take 1 tablet (50 mg total) by mouth 2 (two) times daily. 56 tablet 5   amLODipine (NORVASC) 5 MG tablet Take 5 mg  by mouth daily.     atorvastatin  (LIPITOR) 40 MG tablet Take 1 tablet (40 mg total) by mouth daily at 6 PM. Restart after 1 week, when Liver function tests normalize     calcium -vitamin D (OSCAL WITH D) 500-5 MG-MCG tablet Take 1 tablet by mouth  daily. 30 tablet 5   Continuous Glucose Sensor (FREESTYLE LIBRE 3 PLUS SENSOR) MISC change every 15 days     FARXIGA 10 MG TABS tablet Take 10 mg by mouth daily.     HUMULIN N KWIKPEN 100 UNIT/ML KwikPen Now taking 4 units AM, 2 units PM per patient report     KERENDIA 10 MG TABS Take 10 mg by mouth daily.     losartan (COZAAR) 50 MG tablet Take 50 mg by mouth daily.     omeprazole  (PRILOSEC) 20 MG capsule Take 1 capsule (20 mg total) by mouth daily. 30 capsule 3   tamoxifen  (NOLVADEX ) 20 MG tablet TAKE 1 TABLET(20 MG) BY MOUTH DAILY 90 tablet 3   zolpidem  (AMBIEN ) 5 MG tablet Take 1 tablet (5 mg total) by mouth at bedtime as needed for sleep. 30 tablet 1   dextromethorphan-guaiFENesin (MUCINEX DM) 30-600 MG 12hr tablet Take 1 tablet by mouth daily. (Patient not taking: Reported on 01/19/2024)     guaiFENesin (MUCINEX) 600 MG 12 hr tablet Take 1,200 mg by mouth 2 (two) times daily. (Patient not taking: Reported on 01/19/2024)     No current facility-administered medications for this visit.    PHYSICAL EXAMINATION: ECOG PERFORMANCE STATUS: 1 - Symptomatic but completely ambulatory  Vitals:   01/19/24 0958  BP: (!) 129/50  Pulse: 81  Resp: 16  Temp: 97.9 F (36.6 C)  SpO2: 100%   Filed Weights   01/19/24 0958  Weight: 172 lb 11.2 oz (78.3 kg)      LABORATORY DATA:  I have reviewed the data as listed    Latest Ref Rng & Units 01/12/2024    2:51 PM 10/20/2023   10:44 AM 07/21/2023   10:41 AM  CMP  Glucose 70 - 99 mg/dL  857  864   BUN 8 - 23 mg/dL  28  28   Creatinine 9.38 - 1.24 mg/dL 7.79  7.98  7.83   Sodium 135 - 145 mmol/L  138  141   Potassium 3.5 - 5.1 mmol/L  4.5  4.3   Chloride 98 - 111 mmol/L  107  111   CO2 22 - 32 mmol/L  25  25   Calcium  8.9 - 10.3 mg/dL  8.7  8.8   Total Protein 6.5 - 8.1 g/dL  6.6  6.4   Total Bilirubin 0.0 - 1.2 mg/dL  0.7  0.7   Alkaline Phos 38 - 126 U/L  41  55   AST 15 - 41 U/L  15  14   ALT 0 - 44 U/L  11  11     Lab Results   Component Value Date   WBC 6.1 01/19/2024   HGB 11.0 (L) 01/19/2024   HCT 32.7 (L) 01/19/2024   MCV 95.9 01/19/2024   PLT 133 (L) 01/19/2024   NEUTROABS 4.3 01/19/2024    ASSESSMENT & PLAN:  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 Ultrasound: 3.9 cm irregular hypoechoic mass retroareolar left breast, 4 abnormal left axillary lymph nodes with cortical thickening, mild right gynecomastia. Biopsy: Grade 2 IDC, LVI present,  lymph node positive, ER 100%, PR 50%, Ki-67 30%, HER2 2+, FISH negative ratio 1.08    CT CAP 01/11/2023: Left breast mass, left axillary lymph nodes, mediastinal and hilar lymphadenopathy, multiple bilateral lung nodules, sclerotic lesions throughout thoracic spine, pelvis and lumbar spine, multiple lesions left and right kidney (suggestive of solid kidney cancer)   Current treatment: Tamoxifen  with Verzenio  started 01/16/2023 [name] toxicities: Severe fatigue: Stable: We discussed taking oral B12 supplement. Continue with the same dosage of [name].   CT scan on 04/17/2023 interval improvement of left breast mass, left axillary mediastinal lymph nodes and bilateral pulmonary nodules.  Widespread bone metastases, indeterminate lesions lower poles of both kidneys complex cysts CT CAP 07/15/2023: Further interval improvement in the left breast mass as well as left axillary, mediastinal and pulmonary nodules, similar bone metastases CT CAP 01/17/2024: Stable bone mets, stable left breast mass and left axillary lymph node, 4.4 cm ascending aortic aneurysm   CA 27-29: 82.6 on 10/20/2023, was 145.9 on 07/21/2023 (it was 1849 at 5 months ago)   Chronic kidney disease: Follows with nephrology.   continue current therapy and rescan in January 2026 Return to clinic in 2 months with labs Assessment & Plan Malignant neoplasm of  left male breast with stable bone metastases Stable disease with no new lesions or increase in existing lesions. Breast mass and lymph node unchanged. Abemaciclib  well tolerated with improved blood markers. - Continue Abemaciclib  (Verzenio ) 50 MG oral bid. - Monitor with scans every six months, next scan in January.  Fatigue due to cancer therapy Fatigue from Verzenio  affects daily activities but managed with rest. Hemoglobin levels adequate, ruling out anemia. - Continue current dose of Verzenio  as it is the lowest available.  Chronic kidney disease, stage 3 Creatinine level stable at 2.2. Advised against contrast use in scans. - Avoid contrast in future imaging studies. - Monitor kidney function regularly.  Joint pain and stiffness, right leg Pain managed with Tylenol , providing relief. - Continue Tylenol  as needed for pain management.      No orders of the defined types were placed in this encounter.  The patient has a good understanding of the overall plan. he agrees with it. he will call with any problems that may develop before the next visit here. Total time spent: 30 mins including face to face time and time spent for planning, charting and co-ordination of care   Naomi MARLA Chad, MD 01/19/24

## 2024-01-19 NOTE — Progress Notes (Signed)
 Corr Ca = 8.14.  OK to proceed w/ Xgeva  per Dr. Odean. Pt aware to take 2 OTC calcium  tabs daily   Wilma Dollar, Pharm.D., CPP 01/19/2024@11 :03 AM

## 2024-01-19 NOTE — Assessment & Plan Note (Signed)
 12/09/2022: mammogram detected asymmetry/mass retroareolar left breast with left nipple retraction and skin thickening. Ultrasound: 3.9 cm irregular hypoechoic mass retroareolar left breast, 4 abnormal left axillary lymph nodes with cortical thickening, mild right gynecomastia. Biopsy: Grade 2 IDC, LVI present, lymph node positive, ER 100%, PR 50%, Ki-67 30%, HER2 2+, FISH negative ratio 1.08    CT CAP 01/11/2023: Left breast mass, left axillary lymph nodes, mediastinal and hilar lymphadenopathy, multiple bilateral lung nodules, sclerotic lesions throughout thoracic spine, pelvis and lumbar spine, multiple lesions left and right kidney (suggestive of solid kidney cancer)   Current treatment: Tamoxifen  with Verzenio  started 01/16/2023 [name] toxicities: Severe fatigue: Stable: We discussed taking oral B12 supplement. Continue with the same dosage of [name].   CT scan on 04/17/2023 interval improvement of left breast mass, left axillary mediastinal lymph nodes and bilateral pulmonary nodules.  Widespread bone metastases, indeterminate lesions lower poles of both kidneys complex cysts CT CAP 07/15/2023: Further interval improvement in the left breast mass as well as left axillary, mediastinal and pulmonary nodules, similar bone metastases CT CAP 01/17/2024: Stable bone mets, stable left breast mass and left axillary lymph node, 4.4 cm ascending aortic aneurysm   CA 27-29: 82.6 on 10/20/2023, was 145.9 on 07/21/2023 (it was 1849 at 5 months ago)   Chronic kidney disease: referral to nephrology.   continue current therapy and rescan in January 2026 Follow-up after that to discuss results

## 2024-01-20 ENCOUNTER — Telehealth: Payer: Self-pay | Admitting: Hematology and Oncology

## 2024-01-20 ENCOUNTER — Telehealth: Payer: Self-pay | Admitting: *Deleted

## 2024-01-20 LAB — CANCER ANTIGEN 27.29: CA 27.29: 66.8 U/mL — ABNORMAL HIGH (ref 0.0–38.6)

## 2024-01-20 NOTE — Telephone Encounter (Signed)
 Per MD request, RN placed call to pt with CA 27.29 results.  No answer, LVM for pt to return call to the office.

## 2024-01-20 NOTE — Telephone Encounter (Signed)
 Spoke with patient confirming upcoming appointment

## 2024-02-04 ENCOUNTER — Other Ambulatory Visit: Payer: Self-pay

## 2024-02-04 NOTE — Progress Notes (Signed)
 Specialty Pharmacy Refill Coordination Note  Jesse Hoffman is a 88 y.o. male contacted today regarding refills of specialty medication(s) Abemaciclib (VERZENIO)   Patient requested [name] at [facility] at [place] date: 02/08/24   Medication will be filled on 02/05/24.

## 2024-02-05 ENCOUNTER — Other Ambulatory Visit: Payer: Self-pay

## 2024-02-08 ENCOUNTER — Other Ambulatory Visit (HOSPITAL_COMMUNITY): Payer: Self-pay

## 2024-03-02 ENCOUNTER — Other Ambulatory Visit: Payer: Self-pay | Admitting: Pharmacy Technician

## 2024-03-02 ENCOUNTER — Other Ambulatory Visit: Payer: Self-pay

## 2024-03-02 NOTE — Progress Notes (Signed)
 Specialty Pharmacy Refill Coordination Note  Jesse Hoffman is a 88 y.o. male contacted today regarding refills of specialty medication(s) Abemaciclib  (VERZENIO )   Patient requested [name] at [facility] at [place] date: 03/09/24   Medication will be filled on 03/08/24.

## 2024-03-09 ENCOUNTER — Other Ambulatory Visit (HOSPITAL_COMMUNITY): Payer: Self-pay

## 2024-03-14 DIAGNOSIS — Z23 Encounter for immunization: Secondary | ICD-10-CM | POA: Diagnosis not present

## 2024-03-21 ENCOUNTER — Other Ambulatory Visit: Payer: Self-pay

## 2024-03-21 DIAGNOSIS — Z170 Estrogen receptor positive status [ER+]: Secondary | ICD-10-CM

## 2024-03-21 DIAGNOSIS — C7951 Secondary malignant neoplasm of bone: Secondary | ICD-10-CM

## 2024-03-22 ENCOUNTER — Other Ambulatory Visit: Payer: Self-pay

## 2024-03-22 ENCOUNTER — Inpatient Hospital Stay: Admitting: Hematology and Oncology

## 2024-03-22 ENCOUNTER — Inpatient Hospital Stay: Attending: Hematology and Oncology

## 2024-03-22 VITALS — BP 121/83 | HR 53 | Temp 97.3°F | Resp 16 | Wt 175.7 lb

## 2024-03-22 DIAGNOSIS — Z170 Estrogen receptor positive status [ER+]: Secondary | ICD-10-CM | POA: Diagnosis not present

## 2024-03-22 DIAGNOSIS — C50022 Malignant neoplasm of nipple and areola, left male breast: Secondary | ICD-10-CM | POA: Diagnosis present

## 2024-03-22 DIAGNOSIS — Z79810 Long term (current) use of selective estrogen receptor modulators (SERMs): Secondary | ICD-10-CM | POA: Diagnosis not present

## 2024-03-22 DIAGNOSIS — Z1721 Progesterone receptor positive status: Secondary | ICD-10-CM | POA: Diagnosis not present

## 2024-03-22 DIAGNOSIS — Z17411 Hormone receptor positive with human epidermal growth factor receptor 2 negative status: Secondary | ICD-10-CM | POA: Diagnosis not present

## 2024-03-22 DIAGNOSIS — C7951 Secondary malignant neoplasm of bone: Secondary | ICD-10-CM | POA: Insufficient documentation

## 2024-03-22 DIAGNOSIS — C50922 Malignant neoplasm of unspecified site of left male breast: Secondary | ICD-10-CM | POA: Insufficient documentation

## 2024-03-22 LAB — CBC WITH DIFFERENTIAL (CANCER CENTER ONLY)
Abs Immature Granulocytes: 0.02 K/uL (ref 0.00–0.07)
Basophils Absolute: 0 K/uL (ref 0.0–0.1)
Basophils Relative: 1 %
Eosinophils Absolute: 0.1 K/uL (ref 0.0–0.5)
Eosinophils Relative: 2 %
HCT: 30.9 % — ABNORMAL LOW (ref 39.0–52.0)
Hemoglobin: 10.7 g/dL — ABNORMAL LOW (ref 13.0–17.0)
Immature Granulocytes: 0 %
Lymphocytes Relative: 28 %
Lymphs Abs: 1.4 K/uL (ref 0.7–4.0)
MCH: 32.9 pg (ref 26.0–34.0)
MCHC: 34.6 g/dL (ref 30.0–36.0)
MCV: 95.1 fL (ref 80.0–100.0)
Monocytes Absolute: 0.4 K/uL (ref 0.1–1.0)
Monocytes Relative: 8 %
Neutro Abs: 3 K/uL (ref 1.7–7.7)
Neutrophils Relative %: 61 %
Platelet Count: 144 K/uL — ABNORMAL LOW (ref 150–400)
RBC: 3.25 MIL/uL — ABNORMAL LOW (ref 4.22–5.81)
RDW: 12.6 % (ref 11.5–15.5)
WBC Count: 4.9 K/uL (ref 4.0–10.5)
nRBC: 0 % (ref 0.0–0.2)

## 2024-03-22 LAB — CMP (CANCER CENTER ONLY)
ALT: 10 U/L (ref 0–44)
AST: 14 U/L — ABNORMAL LOW (ref 15–41)
Albumin: 3.9 g/dL (ref 3.5–5.0)
Alkaline Phosphatase: 57 U/L (ref 38–126)
Anion gap: 4 — ABNORMAL LOW (ref 5–15)
BUN: 22 mg/dL (ref 8–23)
CO2: 26 mmol/L (ref 22–32)
Calcium: 8.4 mg/dL — ABNORMAL LOW (ref 8.9–10.3)
Chloride: 105 mmol/L (ref 98–111)
Creatinine: 2.09 mg/dL — ABNORMAL HIGH (ref 0.61–1.24)
GFR, Estimated: 30 mL/min — ABNORMAL LOW (ref >60)
Glucose, Bld: 128 mg/dL — ABNORMAL HIGH (ref 70–99)
Potassium: 5.1 mmol/L (ref 3.5–5.1)
Sodium: 135 mmol/L (ref 135–145)
Total Bilirubin: 0.4 mg/dL (ref 0.0–1.2)
Total Protein: 6.8 g/dL (ref 6.5–8.1)

## 2024-03-22 NOTE — Progress Notes (Signed)
 Patient Care Team: Verdia Lombard, MD as PCP - General (Internal Medicine) Francyne Headland, MD as PCP - Cardiology (Cardiology) Tyree Nanetta SAILOR, RN as Oncology Nurse Navigator Odean Potts, MD as Consulting Physician (Hematology and Oncology) Lucila Norleen LABOR, RPH-CPP as Pharmacist (Hematology and Oncology)  DIAGNOSIS:  Encounter Diagnosis  Name Primary?   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 Ultrasound: 3.9 cm irregular hypoechoic mass retroareolar left breast, 4 abnormal left axillary lymph nodes with cortical thickening, mild right gynecomastia. Biopsy: Grade 2 IDC, LVI present, lymph node positive, ER 100%, PR 50%, Ki-67 30%, HER2 2+, FISH negative ratio 1.08    12/23/2022 Cancer Staging   Staging form: Breast, AJCC 8th Edition - Clinical: Stage IIA (cT2, cN1, cM0, G2, ER+, PR+, HER2-) - Signed by Odean Potts, MD on 12/23/2022 Stage prefix: Initial diagnosis Histologic grading system: 3 grade system   12/30/2022 Genetic Testing   Negative [facility] Common Hereditary Cancers +RNA Panel.  Report date is 12/30/2022.    The [employer] Common Hereditary Cancers + RNA Panel includes sequencing, deletion/duplication, and RNA analysis of the following 48 genes: APC, ATM, AXIN2, BAP1, BARD1, BMPR1A, BRCA1, BRCA2, BRIP1, CDH1, CDK4*, CDKN2A*, CHEK2, CTNNA1, DICER1, EPCAM* (del/dup only), FH, GREM1* (promoter dup analysis only), HOXB13*, KIT*, MBD4*, MEN1, MLH1, MSH2, MSH3, MSH6, MUTYH, NF1, NTHL1, PALB2, PDGFRA*, PMS2, POLD1, POLE, PTEN, RAD51C, RAD51D, SDHA (sequencing only), SDHB, SDHC, SDHD, SMAD4, SMARCA4, STK11, TP53, TSC1, TSC2, VHL.  *Genes without RNA  analysis.      CHIEF COMPLIANT: Follow-up of metastatic breast cancer and Jesse Hoffman   HISTORY OF PRESENT ILLNESS: Mr. Mcpartlin is a 88 year old with above-mentioned metastatic breast cancer who is on Jesse with Hoffman  and is tolerating the treatment extremely well.  His major complaint is fatigue.  Other than that he is not experiencing any new issues.  He has had a dry cough for the past few weeks.  Denies any fevers or chills.    ALLERGIES:  is allergic to allopurinol.  MEDICATIONS:  Current Outpatient Medications  Medication Sig Dispense Refill   abemaciclib  (VERZENIO ) 50 MG tablet Take 1 tablet (50 mg total) by mouth 2 (two) times daily. 56 tablet 5   amLODipine (NORVASC) 5 MG tablet Take 5 mg by mouth daily.     atorvastatin  (LIPITOR) 40 MG tablet Take 1 tablet (40 mg total) by mouth daily at 6 PM. Restart after 1 week, when Liver function tests normalize     calcium -vitamin D (OSCAL WITH D) 500-5 MG-MCG tablet Take 1 tablet by mouth daily. 30 tablet 5   Continuous Glucose Sensor (FREESTYLE LIBRE 3 PLUS SENSOR) MISC change every 15 days     dextromethorphan-guaiFENesin (MUCINEX DM) 30-600 MG 12hr tablet Take 1 tablet by mouth daily. (Patient not taking: Reported on 01/19/2024)     FARXIGA 10 MG TABS tablet Take 10 mg by mouth daily.     guaiFENesin (MUCINEX) 600 MG 12 hr tablet Take 1,200 mg by mouth 2 (two) times daily. (Patient not taking: Reported on 01/19/2024)     HUMULIN N KWIKPEN 100 UNIT/ML KwikPen Now taking 4 units AM, 2 units PM per patient report     KERENDIA 10 MG TABS Take 10 mg by mouth daily.  losartan (COZAAR) 50 MG tablet Take 50 mg by mouth daily.     omeprazole  (PRILOSEC) 20 MG capsule Take 1 capsule (20 mg total) by mouth daily. 30 capsule 3   Hoffman  (NOLVADEX ) 20 MG tablet TAKE 1 TABLET(20 MG) BY MOUTH DAILY 90 tablet 3   zolpidem  (AMBIEN ) 5 MG tablet Take 1 tablet (5 mg total) by mouth at bedtime as needed for sleep. 30 tablet 1   No  current facility-administered medications for this visit.    PHYSICAL EXAMINATION: ECOG PERFORMANCE STATUS: 1 - Symptomatic but completely ambulatory  Vitals:   03/22/24 1440  BP: 121/83  Pulse: (!) 53  Resp: 16  Temp: (!) 97.3 F (36.3 C)  SpO2: 100%   Filed Weights   03/22/24 1440  Weight: 175 lb 11.2 oz (79.7 kg)      LABORATORY DATA:  I have reviewed the data as listed    Latest Ref Rng & Units 01/19/2024    9:42 AM 01/12/2024    2:51 PM 10/20/2023   10:44 AM  CMP  Glucose 70 - 99 mg/dL 824   857   BUN 8 - 23 mg/dL 23   28   Creatinine 9.38 - 1.24 mg/dL 7.98  7.79  7.98   Sodium 135 - 145 mmol/L 141   138   Potassium 3.5 - 5.1 mmol/L 4.5   4.5   Chloride 98 - 111 mmol/L 111   107   CO2 22 - 32 mmol/L 25   25   Calcium  8.9 - 10.3 mg/dL 7.9   8.7   Total Protein 6.5 - 8.1 g/dL 6.8   6.6   Total Bilirubin 0.0 - 1.2 mg/dL 0.5   0.7   Alkaline Phos 38 - 126 U/L 44   41   AST 15 - 41 U/L 13   15   ALT 0 - 44 U/L 9   11     Lab Results  Component Value Date   WBC 4.9 03/22/2024   HGB 10.7 (L) 03/22/2024   HCT 30.9 (L) 03/22/2024   MCV 95.1 03/22/2024   PLT 144 (L) 03/22/2024   NEUTROABS 3.0 03/22/2024    ASSESSMENT & PLAN:  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 Ultrasound: 3.9 cm irregular hypoechoic mass retroareolar left breast, 4 abnormal left axillary lymph nodes with cortical thickening, mild right gynecomastia. Biopsy: Grade 2 IDC, LVI present, lymph node positive, ER 100%, PR 50%, Ki-67 30%, HER2 2+, FISH negative ratio 1.08    CT CAP 01/11/2023: Left breast mass, left axillary lymph nodes, mediastinal and hilar lymphadenopathy, multiple bilateral lung nodules, sclerotic lesions throughout thoracic spine, pelvis and lumbar spine, multiple lesions left and right kidney (suggestive of solid kidney cancer)    Current treatment: Hoffman  with Verzenio  started 01/16/2023 Jesse toxicities: Severe fatigue: Stable: We discussed taking oral B12 supplement. Continue with the same dosage of Jesse.   CT scan on 04/17/2023 interval improvement of left breast mass, left axillary mediastinal lymph nodes and bilateral pulmonary nodules.  Widespread bone metastases, indeterminate lesions lower poles of both kidneys complex cysts CT CAP 07/15/2023: Further interval improvement in the left breast mass as well as left axillary, mediastinal and pulmonary nodules, similar bone metastases CT CAP 01/17/2024: Stable bone mets, stable left breast mass and left axillary lymph node, 4.4 cm ascending aortic aneurysm   CA 27-29: 66.8 on 01/19/2024, 82.6 on 10/20/2023,  was 145.9 on 07/21/2023 (it was 1849 at 5 months ago)   Chronic kidney disease: Follows with nephrology.   continue current therapy and rescan in January 2026 Return to clinic in 2 months with labs      No orders of the defined types were placed in this encounter.  The patient has a good understanding of the overall plan. he agrees with it. he will call with any problems that may develop before the next visit here. Total time spent: 30 mins including face to face time and time spent for planning, charting and co-ordination of care   Viinay K Almena Hokenson, MD 03/22/24

## 2024-03-22 NOTE — Assessment & Plan Note (Signed)
 12/09/2022: mammogram detected asymmetry/mass retroareolar left breast with left nipple retraction and skin thickening. Ultrasound: 3.9 cm irregular hypoechoic mass retroareolar left breast, 4 abnormal left axillary lymph nodes with cortical thickening, mild right gynecomastia. Biopsy: Grade 2 IDC, LVI present, lymph node positive, ER 100%, PR 50%, Ki-67 30%, HER2 2+, FISH negative ratio 1.08    CT CAP 01/11/2023: Left breast mass, left axillary lymph nodes, mediastinal and hilar lymphadenopathy, multiple bilateral lung nodules, sclerotic lesions throughout thoracic spine, pelvis and lumbar spine, multiple lesions left and right kidney (suggestive of solid kidney cancer)   Current treatment: Tamoxifen  with Verzenio  started 01/16/2023 [name] toxicities: Severe fatigue: Stable: We discussed taking oral B12 supplement. Continue with the same dosage of [name].   CT scan on 04/17/2023 interval improvement of left breast mass, left axillary mediastinal lymph nodes and bilateral pulmonary nodules.  Widespread bone metastases, indeterminate lesions lower poles of both kidneys complex cysts CT CAP 07/15/2023: Further interval improvement in the left breast mass as well as left axillary, mediastinal and pulmonary nodules, similar bone metastases CT CAP 01/17/2024: Stable bone mets, stable left breast mass and left axillary lymph node, 4.4 cm ascending aortic aneurysm   CA 27-29: 66.8 on 01/19/2024, 82.6 on 10/20/2023, was 145.9 on 07/21/2023 (it was 1849 at 5 months ago)   Chronic kidney disease: Follows with nephrology.   continue current therapy and rescan in January 2026 Return to clinic in 2 months with labs

## 2024-03-23 LAB — CANCER ANTIGEN 27.29: CA 27.29: 64.5 U/mL — ABNORMAL HIGH (ref 0.0–38.6)

## 2024-03-30 ENCOUNTER — Other Ambulatory Visit (HOSPITAL_COMMUNITY): Payer: Self-pay

## 2024-03-30 ENCOUNTER — Other Ambulatory Visit: Payer: Self-pay | Admitting: Hematology and Oncology

## 2024-03-30 ENCOUNTER — Other Ambulatory Visit: Payer: Self-pay

## 2024-03-30 MED ORDER — ABEMACICLIB 50 MG PO TABS
50.0000 mg | ORAL_TABLET | Freq: Two times a day (BID) | ORAL | 5 refills | Status: AC
  Filled 2024-03-30 – 2024-04-01 (×2): qty 56, 28d supply, fill #0
  Filled 2024-04-27: qty 56, 28d supply, fill #1
  Filled 2024-05-26: qty 56, 28d supply, fill #2
  Filled 2024-06-21: qty 56, 28d supply, fill #3
  Filled 2024-07-20: qty 56, 28d supply, fill #4

## 2024-04-01 ENCOUNTER — Other Ambulatory Visit: Payer: Self-pay

## 2024-04-01 ENCOUNTER — Other Ambulatory Visit: Payer: Self-pay | Admitting: Pharmacy Technician

## 2024-04-01 NOTE — Progress Notes (Signed)
 Specialty Pharmacy Refill Coordination Note  Jesse Hoffman is a 88 y.o. male contacted today regarding refills of specialty medication(s) Abemaciclib  (VERZENIO )   Patient requested [name] at [facility] at [place] date: 04/06/24   Medication will be filled on 04/05/24.

## 2024-04-04 ENCOUNTER — Other Ambulatory Visit: Payer: Self-pay

## 2024-04-06 ENCOUNTER — Other Ambulatory Visit (HOSPITAL_COMMUNITY): Payer: Self-pay

## 2024-04-27 ENCOUNTER — Encounter (INDEPENDENT_AMBULATORY_CARE_PROVIDER_SITE_OTHER): Payer: Self-pay

## 2024-04-27 ENCOUNTER — Other Ambulatory Visit: Payer: Self-pay

## 2024-04-27 ENCOUNTER — Other Ambulatory Visit: Payer: Self-pay | Admitting: Pharmacy Technician

## 2024-04-27 NOTE — Progress Notes (Signed)
 Specialty Pharmacy Refill Coordination Note  Jesse Hoffman is a 88 y.o. male contacted today regarding refills of specialty medication(s) Abemaciclib  (VERZENIO )   Patient requested (Patient-Rptd) Pickup at [facility] at [place] [facility] date: (Patient-Rptd) 05/04/24   Medication will be filled on: 05/03/2024

## 2024-05-02 ENCOUNTER — Other Ambulatory Visit: Payer: Self-pay

## 2024-05-02 ENCOUNTER — Telehealth: Payer: Self-pay

## 2024-05-02 ENCOUNTER — Encounter: Payer: Self-pay | Admitting: Hematology and Oncology

## 2024-05-02 ENCOUNTER — Other Ambulatory Visit (HOSPITAL_COMMUNITY): Payer: Self-pay

## 2024-05-02 NOTE — Progress Notes (Signed)
 error

## 2024-05-02 NOTE — Telephone Encounter (Signed)
 Oral Oncology Patient Advocate Encounter  Was successful in securing patient a $7500 grant from [facility] to provide copayment coverage for Verzenio .  This will keep the out of pocket expense at $0.     [employer] ID: [id number]   The billing information is as follows and has been shared with [facility].    RxBin: [id number] PCN: [id number] Member ID: [id number] Group ID: [id number] Dates of Eligibility: 05/16/24 through 05/15/25  Fund:  breast  Lucie Lamer, CPhT [place]  [facility] Oncology Pharmacy Patient Advocate Specialist II [place] Phone: [phone]  Fax: [phone] [email or web address]

## 2024-05-03 ENCOUNTER — Other Ambulatory Visit: Payer: Self-pay

## 2024-05-05 ENCOUNTER — Other Ambulatory Visit (HOSPITAL_COMMUNITY): Payer: Self-pay

## 2024-05-05 ENCOUNTER — Other Ambulatory Visit: Payer: Self-pay

## 2024-05-05 NOTE — Progress Notes (Signed)
 Specialty Pharmacy Ongoing Clinical Assessment Note  Jesse Hoffman is a 88 y.o. male who is being followed by the specialty pharmacy service for RxSp Oncology   Patient's specialty medication(s) reviewed today: Abemaciclib  (VERZENIO )   Missed doses in the last 4 weeks: 0   Patient/Caregiver did not have any additional questions or concerns.   Therapeutic benefit summary: Patient is achieving benefit   Adverse events/side effects summary: Experienced adverse events/side effects (severe fatigue, tolerates for now but talking to provider about Vitamin B supplement)   Patient's therapy is appropriate to: Continue    Goals Addressed             This Visit's Progress    Slow Disease Progression   On track    Patient is on track. Patient will maintain adherence. CA 27.29 is showing a good response to therapy as it continues to decline, most recently it was 64.5 U/mL on 03/22/24.          Follow up: 6 months  [facility]

## 2024-05-16 ENCOUNTER — Encounter: Payer: Self-pay | Admitting: Physician Assistant

## 2024-05-16 ENCOUNTER — Ambulatory Visit: Attending: Physician Assistant | Admitting: Physician Assistant

## 2024-05-16 VITALS — BP 100/68 | HR 78 | Ht 70.0 in | Wt 169.8 lb

## 2024-05-16 DIAGNOSIS — I350 Nonrheumatic aortic (valve) stenosis: Secondary | ICD-10-CM | POA: Diagnosis not present

## 2024-05-16 DIAGNOSIS — I10 Essential (primary) hypertension: Secondary | ICD-10-CM

## 2024-05-16 DIAGNOSIS — E785 Hyperlipidemia, unspecified: Secondary | ICD-10-CM | POA: Diagnosis not present

## 2024-05-16 DIAGNOSIS — R011 Cardiac murmur, unspecified: Secondary | ICD-10-CM | POA: Diagnosis not present

## 2024-05-16 DIAGNOSIS — I453 Trifascicular block: Secondary | ICD-10-CM

## 2024-05-16 MED ORDER — AMLODIPINE BESYLATE 2.5 MG PO TABS: 2.5000 mg | ORAL_TABLET | Freq: Every day | ORAL | 3 refills | Status: AC

## 2024-05-16 NOTE — Progress Notes (Unsigned)
 Cardiology Office Note   Date:  05/17/2024  ID:  Jesse, Hoffman Dec 08, 1935, MRN [id number] PCP: Verdia Lombard, MD  [facility] Providers Cardiologist:  Jerel Balding, MD     History of Present Illness Jesse Hoffman is a 88 y.o. male with PMH of HTN, HLD, DM II, CKD stage IIIb, recurrent biliary problem, and history of heart murmur.  Patient was previously seen by Dr. Balding in November 2023 for heart murmur.  Subsequent echocardiogram obtained on 05/21/2022 showed EF 50 to 55%, no regional wall motion abnormality, mild aortic stenosis, trivial AI, mild MR, moderate dilatation the ascending aort measuring at 4.2 cm.  The initial plan is to repeat echocardiogram after 1 year and if stable can follow-up every 3 years.  However patient has been lost to follow-up since.  He was diagnosed with left breast cancer with estrogen receptor positive D in June 2024.  He has been treated with tamoxifen  and Verzenio  but developed significant fatigue on Verzenio .  Patient presents today for follow-up.  He continued to have fatigue which is his major concern.  He also feels short of breath after walking a certain distance but no chest pain.  He has no lower extremity edema, orthopnea or PND.  Blood pressure is borderline low, I will reduce his amlodipine  to 2.5 mg daily to allow the blood pressure to drift up.  The only time he feel dizzy is when he changed body positions.  On physical exam, he has 1-2 out of 10 systolic heart murmur at the right upper sternal border.  I will obtain an echocardiogram.  He has a prior history of mild aortic stenosis and mild mitral regurgitation.  EKG shows sinus rhythm, right bundle branch block and left anterior fascicular block.  We discussed the natural course of conduction disorder, he is aware to contact cardiology service if he has sudden onset of severe dizziness and the feeling of passing out even without body position changes.  Otherwise, if  echocardiogram is normal, he can follow-up with Dr. Balding in 1 year.  ROS:   He denies chest pain, palpitations, dyspnea, pnd, orthopnea, n, v, dizziness, syncope, edema, weight gain, or early satiety. All other systems reviewed and are otherwise negative except as noted above.    Studies Reviewed EKG Interpretation Date/Time:  Monday May 16 2024 14:11:38 EST Ventricular Rate:  78 PR Interval:  158 QRS Duration:  142 QT Interval:  420 QTC Calculation: 478 R Axis:   129  Text Interpretation: Sinus rhythm with marked sinus arrhythmia Right bundle branch block Left posterior fascicular block Bifascicular block When compared with ECG of 12-Jan-2023 14:47, Premature atrial complexes are no longer Present (RBBB and left posterior fascicular block) has replaced Non-specific intra-ventricular conduction delay Confirmed by Charla Criscione [phone]) on 05/16/2024 2:14:54 PM    Cardiac Studies & Procedures   ______________________________________________________________________________________________     ECHOCARDIOGRAM  ECHOCARDIOGRAM COMPLETE 05/21/2022  Narrative ECHOCARDIOGRAM REPORT    Patient Name:   Jesse Hoffman Date of Exam: 05/21/2022 Medical Rec #:  [id number]         Height:       71.0 in Accession #:    [id number]        Weight:       165.6 lb Date of Birth:  [birth year]        BSA:          1.946 m Patient Age:    65 years  BP:           126/68 mmHg Patient Gender: M                 HR:           83 bpm. Exam Location:  [address]  Procedure: 2D Echo, 3D Echo, Cardiac Doppler, Color Doppler and Strain Analysis  Indications:    R01.1 Murmur  History:        Patient has no prior history of Echocardiogram examinations. Risk Factors:Hypertension, Diabetes, Dyslipidemia and Former Smoker. CKD stage 3.  Sonographer:    Marshia Lawyer BS, RDCS Referring Phys: [id number] MIHAI CROITORU  IMPRESSIONS   1. Left ventricular ejection fraction, by estimation, is 50 to  55%. The left ventricle has low normal function. The left ventricle has no regional wall motion abnormalities. The left ventricular internal cavity size was mildly dilated. Left ventricular diastolic parameters are indeterminate. The average left ventricular global longitudinal strain is -21.0 %. The global longitudinal strain is normal. 2. Right ventricular systolic function is normal. The right ventricular size is normal. 3. Left atrial size was moderately dilated. 4. The mitral valve is abnormal. Mild mitral valve regurgitation. No evidence of mitral stenosis. 5. The aortic valve is normal in structure. There is moderate calcification of the aortic valve. Aortic valve regurgitation is trivial. Mild aortic valve stenosis. 6. Aortic dilatation noted. There is moderate dilatation of the ascending aorta. 7. The inferior vena cava is normal in size with greater than 50% respiratory variability, suggesting right atrial pressure of 3 mmHg.  FINDINGS Left Ventricle: Left ventricular ejection fraction, by estimation, is 50 to 55%. The left ventricle has low normal function. The left ventricle has no regional wall motion abnormalities. The average left ventricular global longitudinal strain is -21.0 %. The global longitudinal strain is normal. The left ventricular internal cavity size was mildly dilated. There is no left ventricular hypertrophy. Left ventricular diastolic parameters are indeterminate.  Right Ventricle: The right ventricular size is normal. No increase in right ventricular wall thickness. Right ventricular systolic function is normal.  Left Atrium: Left atrial size was moderately dilated.  Right Atrium: Right atrial size was normal in size.  Pericardium: There is no evidence of pericardial effusion.  Mitral Valve: The mitral valve is abnormal. There is mild thickening of the mitral valve leaflet(s). Mild mitral valve regurgitation. No evidence of mitral valve stenosis.  Tricuspid  Valve: The tricuspid valve is normal in structure. Tricuspid valve regurgitation is mild . No evidence of tricuspid stenosis.  Aortic Valve: The aortic valve is normal in structure. There is moderate calcification of the aortic valve. Aortic valve regurgitation is trivial. Mild aortic stenosis is present. Aortic valve mean gradient measures 10.5 mmHg. Aortic valve peak gradient measures 18.8 mmHg. Aortic valve area, by VTI measures 2.07 cm.  Pulmonic Valve: The pulmonic valve was normal in structure. Pulmonic valve regurgitation is not visualized. No evidence of pulmonic stenosis.  Aorta: Aortic dilatation noted. There is moderate dilatation of the ascending aorta.  Venous: The inferior vena cava is normal in size with greater than 50% respiratory variability, suggesting right atrial pressure of 3 mmHg.  IAS/Shunts: No atrial level shunt detected by color flow Doppler.   LEFT VENTRICLE PLAX 2D LVIDd:         5.30 cm   Diastology LVIDs:         3.90 cm   LV e' medial:    7.87 cm/s LV PW:  1.00 cm   LV E/e' medial:  5.6 LV IVS:        1.00 cm   LV e' lateral:   7.87 cm/s LVOT diam:     2.70 cm   LV E/e' lateral: 5.6 LV SV:         92 LV SV Index:   47        2D Longitudinal Strain LVOT Area:     5.73 cm  2D Strain GLS (A2C):   -21.1 % 2D Strain GLS (A3C):   -19.6 % 2D Strain GLS (A4C):   -22.2 % 2D Strain GLS Avg:     -21.0 %  3D Volume EF: 3D EF:        52 % LV EDV:       156 ml LV ESV:       75 ml LV SV:        81 ml  RIGHT VENTRICLE            IVC RV Basal diam:  3.30 cm    IVC diam: 1.90 cm RV S prime:     9.97 cm/s TAPSE (M-mode): 2.4 cm  LEFT ATRIUM             Index        RIGHT ATRIUM           Index LA diam:        5.00 cm 2.57 cm/m   RA Area:     20.20 cm LA Vol (A2C):   79.5 ml 40.85 ml/m  RA Volume:   54.40 ml  27.95 ml/m LA Vol (A4C):   55.9 ml 28.73 ml/m LA Biplane Vol: 70.8 ml 36.38 ml/m AORTIC VALVE AV Area (Vmax):    1.98 cm AV Area  (Vmean):   1.96 cm AV Area (VTI):     2.07 cm AV Vmax:           217.00 cm/s AV Vmean:          145.250 cm/s AV VTI:            0.442 m AV Peak Grad:      18.8 mmHg AV Mean Grad:      10.5 mmHg LVOT Vmax:         75.00 cm/s LVOT Vmean:        49.600 cm/s LVOT VTI:          0.160 m LVOT/AV VTI ratio: 0.36  AORTA Ao Root diam: 3.70 cm Ao Asc diam:  4.20 cm  MITRAL VALVE SHUNTS MV Decel Time: 306 msec    Systemic VTI:  0.16 m MV E velocity: 44.00 cm/s  Systemic Diam: 2.70 cm MV A velocity: 47.30 cm/s MV E/A ratio:  0.93  Maude Emmer MD Electronically signed by Maude Emmer MD Signature Date/Time: 05/21/2022/4:58:11 PM    Final          ______________________________________________________________________________________________      Risk Assessment/Calculations           Physical Exam VS:  BP 100/68 (BP Location: Right Arm, Patient Position: Sitting, Cuff Size: Normal)   Pulse 78   Ht 5' 10 (1.778 m)   Wt 169 lb 12.8 oz (77 kg)   SpO2 92%   BMI 24.36 kg/m        Wt Readings from Last 3 Encounters:  05/16/24 169 lb 12.8 oz (77 kg)  03/22/24 175 lb 11.2 oz (79.7 kg)  01/19/24 172 lb 11.2 oz (78.3 kg)  GEN: Well nourished, well developed in no acute distress NECK: No JVD; No carotid bruits CARDIAC: RRR, no murmurs, rubs, gallops RESPIRATORY:  Clear to auscultation without rales, wheezing or rhonchi  ABDOMEN: Soft, non-tender, non-distended EXTREMITIES:  No edema; No deformity   ASSESSMENT AND PLAN  Heart murmur: Patient has a history of aortic stenosis and mild mitral regurgitation.  Will repeat echocardiogram.  Aortic stenosis: Pending repeat echocardiogram  Hypertension: Blood pressure has been borderline low.  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  He denies any recent significant  dizziness or feeling of passing out.  He is aware to check his blood pressure and heart rate if he does have symptom and if heart rate is slow, he will need to contact cardiology service.        Dispo: Follow-up with Dr. Francyne in 1 year if echocardiogram is normal.  Signed, Scot Ford, PA

## 2024-05-16 NOTE — Patient Instructions (Signed)
 Medication Instructions:  Your physician has recommended you make the following change in your medication:   -Decrease amlodipine  (norvasc ) to 2.5mg  once daily.  *If you need a refill on your cardiac medications before your next appointment, please call your pharmacy*   Testing/Procedures: Your physician has requested that you have an echocardiogram. Echocardiography is a painless test that uses sound waves to create images of your heart. It provides your doctor with information about the size and shape of your heart and how well your heart's chambers and valves are working. This procedure takes approximately one hour. There are no restrictions for this procedure. Please do NOT wear cologne, perfume, aftershave, or lotions (deodorant is allowed). Please arrive 15 minutes prior to your appointment time.  Please note: We ask at that you not bring children with you during ultrasound (echo/ vascular) testing. Due to room size and safety concerns, children are not allowed in the ultrasound rooms during exams. Our front office staff cannot provide observation of children in our lobby area while testing is being conducted. An adult accompanying a patient to their appointment will only be allowed in the ultrasound room at the discretion of the ultrasound technician under special circumstances. We apologize for any inconvenience.   Follow-Up: At [facility], you and your health needs are our priority.  As part of our continuing mission to provide you with exceptional heart care, our providers are all part of one team.  This team includes your primary Cardiologist (physician) and Advanced Practice Providers or APPs (Physician Assistants and Nurse Practitioners) who all work together to provide you with the care you need, when you need it.  Your next appointment:   12 month(s)  Provider:   Jerel Balding, MD

## 2024-05-23 ENCOUNTER — Inpatient Hospital Stay: Attending: Hematology and Oncology

## 2024-05-23 ENCOUNTER — Inpatient Hospital Stay

## 2024-05-23 ENCOUNTER — Inpatient Hospital Stay: Admitting: Hematology and Oncology

## 2024-05-23 VITALS — BP 117/50 | HR 60 | Temp 98.0°F | Resp 16 | Ht 70.0 in | Wt 168.1 lb

## 2024-05-23 DIAGNOSIS — Z17411 Hormone receptor positive with human epidermal growth factor receptor 2 negative status: Secondary | ICD-10-CM | POA: Diagnosis not present

## 2024-05-23 DIAGNOSIS — C50922 Malignant neoplasm of unspecified site of left male breast: Secondary | ICD-10-CM

## 2024-05-23 DIAGNOSIS — I129 Hypertensive chronic kidney disease with stage 1 through stage 4 chronic kidney disease, or unspecified chronic kidney disease: Secondary | ICD-10-CM | POA: Insufficient documentation

## 2024-05-23 DIAGNOSIS — Z7984 Long term (current) use of oral hypoglycemic drugs: Secondary | ICD-10-CM | POA: Insufficient documentation

## 2024-05-23 DIAGNOSIS — Z170 Estrogen receptor positive status [ER+]: Secondary | ICD-10-CM

## 2024-05-23 DIAGNOSIS — Z1721 Progesterone receptor positive status: Secondary | ICD-10-CM | POA: Diagnosis not present

## 2024-05-23 DIAGNOSIS — Z79810 Long term (current) use of selective estrogen receptor modulators (SERMs): Secondary | ICD-10-CM | POA: Insufficient documentation

## 2024-05-23 DIAGNOSIS — C7951 Secondary malignant neoplasm of bone: Secondary | ICD-10-CM | POA: Diagnosis not present

## 2024-05-23 DIAGNOSIS — C50022 Malignant neoplasm of nipple and areola, left male breast: Secondary | ICD-10-CM | POA: Diagnosis present

## 2024-05-23 DIAGNOSIS — Z79899 Other long term (current) drug therapy: Secondary | ICD-10-CM | POA: Insufficient documentation

## 2024-05-23 DIAGNOSIS — N189 Chronic kidney disease, unspecified: Secondary | ICD-10-CM | POA: Insufficient documentation

## 2024-05-23 LAB — CBC WITH DIFFERENTIAL (CANCER CENTER ONLY)
Abs Immature Granulocytes: 0.02 K/uL (ref 0.00–0.07)
Basophils Absolute: 0.1 K/uL (ref 0.0–0.1)
Basophils Relative: 1 %
Eosinophils Absolute: 0.1 K/uL (ref 0.0–0.5)
Eosinophils Relative: 1 %
HCT: 31.8 % — ABNORMAL LOW (ref 39.0–52.0)
Hemoglobin: 10.9 g/dL — ABNORMAL LOW (ref 13.0–17.0)
Immature Granulocytes: 0 %
Lymphocytes Relative: 21 %
Lymphs Abs: 1.2 K/uL (ref 0.7–4.0)
MCH: 32.4 pg (ref 26.0–34.0)
MCHC: 34.3 g/dL (ref 30.0–36.0)
MCV: 94.6 fL (ref 80.0–100.0)
Monocytes Absolute: 0.5 K/uL (ref 0.1–1.0)
Monocytes Relative: 8 %
Neutro Abs: 4 K/uL (ref 1.7–7.7)
Neutrophils Relative %: 69 %
Platelet Count: 156 K/uL (ref 150–400)
RBC: 3.36 MIL/uL — ABNORMAL LOW (ref 4.22–5.81)
RDW: 13.1 % (ref 11.5–15.5)
WBC Count: 5.7 K/uL (ref 4.0–10.5)
nRBC: 0 % (ref 0.0–0.2)

## 2024-05-23 LAB — CMP (CANCER CENTER ONLY)
ALT: 12 U/L (ref 0–44)
AST: 19 U/L (ref 15–41)
Albumin: 3.9 g/dL (ref 3.5–5.0)
Alkaline Phosphatase: 50 U/L (ref 38–126)
Anion gap: 9 (ref 5–15)
BUN: 25 mg/dL — ABNORMAL HIGH (ref 8–23)
CO2: 21 mmol/L — ABNORMAL LOW (ref 22–32)
Calcium: 7.8 mg/dL — ABNORMAL LOW (ref 8.9–10.3)
Chloride: 109 mmol/L (ref 98–111)
Creatinine: 2 mg/dL — ABNORMAL HIGH (ref 0.61–1.24)
GFR, Estimated: 32 mL/min — ABNORMAL LOW (ref >60)
Glucose, Bld: 140 mg/dL — ABNORMAL HIGH (ref 70–99)
Potassium: 4.8 mmol/L (ref 3.5–5.1)
Sodium: 139 mmol/L (ref 135–145)
Total Bilirubin: 0.5 mg/dL (ref 0.0–1.2)
Total Protein: 6.9 g/dL (ref 6.5–8.1)

## 2024-05-23 MED ORDER — DENOSUMAB 120 MG/1.7ML ~~LOC~~ SOLN: 120.0000 mg | Freq: Once | SUBCUTANEOUS | Status: DC

## 2024-05-23 NOTE — Assessment & Plan Note (Signed)
 12/09/2022: mammogram detected asymmetry/mass retroareolar left breast with left nipple retraction and skin thickening. Ultrasound: 3.9 cm irregular hypoechoic mass retroareolar left breast, 4 abnormal left axillary lymph nodes with cortical thickening, mild right gynecomastia. Biopsy: Grade 2 IDC, LVI present, lymph node positive, ER 100%, PR 50%, Ki-67 30%, HER2 2+, FISH negative ratio 1.08    CT CAP 01/11/2023: Left breast mass, left axillary lymph nodes, mediastinal and hilar lymphadenopathy, multiple bilateral lung nodules, sclerotic lesions throughout thoracic spine, pelvis and lumbar spine, multiple lesions left and right kidney (suggestive of solid kidney cancer)   Current treatment: Tamoxifen  with Verzenio  started 01/16/2023 [name] toxicities: Severe fatigue: Stable: We discussed taking oral B12 supplement. Continue with the same dosage of [name].   CT scan on 04/17/2023 interval improvement of left breast mass, left axillary mediastinal lymph nodes and bilateral pulmonary nodules.  Widespread bone metastases, indeterminate lesions lower poles of both kidneys complex cysts CT CAP 07/15/2023: Further interval improvement in the left breast mass as well as left axillary, mediastinal and pulmonary nodules, similar bone metastases CT CAP 01/17/2024: Stable bone mets, stable left breast mass and left axillary lymph node, 4.4 cm ascending aortic aneurysm   CA 27-29: 66.8 on 01/19/2024, 82.6 on 10/20/2023, was 145.9 on 07/21/2023 (it was 1849 at 5 months ago), 64.5 on 03/22/2024   Chronic kidney disease: Follows with nephrology.   continue current therapy and rescan in January 2026 and follow-up 1 week later

## 2024-05-23 NOTE — Progress Notes (Signed)
 Calcium  7.8 (corrects to 7.9) - okay to hold Xgeva  today per MD. Patient to increase calcium  supplement and will attempt to give with next visit.  Harlene Nasuti, PharmD Oncology Infusion Pharmacist 05/23/2024 2:32 PM

## 2024-05-23 NOTE — Patient Instructions (Signed)
 Hold Xgeva  injection today. Please take extra calcium  supplement and we can try with his next appointment per Dr. Odean.

## 2024-05-23 NOTE — Progress Notes (Signed)
 Hold Xgeva  injection today. Patient instructed to take extra calcium  supplement per Dr. Odean. Patient made aware and given copy of lab results. Patient verbalized understanding.

## 2024-05-23 NOTE — Progress Notes (Signed)
 Patient Care Team: Verdia Lombard, MD as PCP - General (Internal Medicine) Francyne Headland, MD as PCP - Cardiology (Cardiology) Tyree Nanetta SAILOR, RN as Oncology Nurse Navigator Odean Potts, MD as Consulting Physician (Hematology and Oncology) Lucila Norleen LABOR, RPH-CPP as Pharmacist (Hematology and Oncology)  DIAGNOSIS:  Encounter Diagnosis  Name Primary?   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 Ultrasound: 3.9 cm irregular hypoechoic mass retroareolar left breast, 4 abnormal left axillary lymph nodes with cortical thickening, mild right gynecomastia. Biopsy: Grade 2 IDC, LVI present, lymph node positive, ER 100%, PR 50%, Ki-67 30%, HER2 2+, FISH negative ratio 1.08    12/23/2022 Cancer Staging   Staging form: Breast, AJCC 8th Edition - Clinical: Stage IIA (cT2, cN1, cM0, G2, ER+, PR+, HER2-) - Signed by Odean Potts, MD on 12/23/2022 Stage prefix: Initial diagnosis Histologic grading system: 3 grade system   12/30/2022 Genetic Testing   Negative [employer] Common Hereditary Cancers +RNA Panel.  Report date is 12/30/2022.    The [employer] Common Hereditary Cancers + RNA Panel includes sequencing, deletion/duplication, and RNA analysis of the following 48 genes: APC, ATM, AXIN2, BAP1, BARD1, BMPR1A, BRCA1, BRCA2, BRIP1, CDH1, CDK4*, CDKN2A*, CHEK2, CTNNA1, DICER1, EPCAM* (del/dup only), FH, GREM1* (promoter dup analysis only), HOXB13*, KIT*, MBD4*, MEN1, MLH1, MSH2, MSH3, MSH6, MUTYH, NF1, NTHL1, PALB2, PDGFRA*, PMS2, POLD1, POLE, PTEN, RAD51C, RAD51D, SDHA (sequencing only), SDHB, SDHC, SDHD, SMAD4, SMARCA4, STK11, TP53, TSC1, TSC2, VHL.  *Genes without RNA  analysis.      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  He missed one dose of Procyngea while at the hospital with his wife but has otherwise tolerated it well. Recent labs show stable white blood cell count and hemoglobin, with improved platelets.  His weight is stable at 168 pounds. His blood pressure medication was recently reduced after cardiology noted slightly elevated readings. He is followed by nephrology for kidney issues.  He previously had a squamous cell carcinoma removed from his calf and a benign lesion removed from the bottom of his foot, with improved walking after the procedure.     ALLERGIES:  is allergic to allopurinol.  MEDICATIONS:  Current Outpatient Medications  Medication Sig Dispense Refill   abemaciclib  (VERZENIO ) 50 MG tablet Take 1 tablet (50 mg total) by mouth 2 (two) times daily. 56 tablet 5   amLODipine  (NORVASC ) 2.5 MG tablet Take 1 tablet (2.5 mg total) by mouth daily. 90 tablet 3   atorvastatin  (LIPITOR) 40 MG tablet Take 1 tablet (40 mg total) by mouth daily at 6 PM. Restart after 1 week, when Liver function tests normalize     calcium -vitamin D (OSCAL WITH D) 500-5 MG-MCG tablet Take 1 tablet by mouth daily. 30 tablet 5   Continuous Glucose Sensor (FREESTYLE LIBRE 3 PLUS SENSOR) MISC change every 15 days     dextromethorphan-guaiFENesin (MUCINEX DM) 30-600 MG 12hr tablet Take 1 tablet by mouth daily. (Patient not taking: Reported on 05/16/2024)     FARXIGA 10 MG TABS tablet Take 10  mg by mouth daily.     guaiFENesin (MUCINEX) 600 MG 12 hr tablet Take 1,200 mg by mouth 2 (two) times daily. (Patient not taking: Reported on 05/16/2024)     HUMULIN N KWIKPEN 100 UNIT/ML KwikPen Now taking 4 units AM, 2 units PM per patient  report     KERENDIA 10 MG TABS Take 10 mg by mouth daily.     losartan (COZAAR) 50 MG tablet Take 50 mg by mouth daily.     omeprazole  (PRILOSEC) 20 MG capsule Take 1 capsule (20 mg total) by mouth daily. 30 capsule 3   tamoxifen  (NOLVADEX ) 20 MG tablet TAKE 1 TABLET(20 MG) BY MOUTH DAILY 90 tablet 3   zolpidem  (AMBIEN ) 5 MG tablet Take 1 tablet (5 mg total) by mouth at bedtime as needed for sleep. (Patient not taking: Reported on 05/16/2024) 30 tablet 1   No current facility-administered medications for this visit.    PHYSICAL EXAMINATION: ECOG PERFORMANCE STATUS: 1 - Symptomatic but completely ambulatory  Vitals:   05/23/24 1332  BP: (!) 117/50  Pulse: 60  Resp: 16  Temp: 98 F (36.7 C)  SpO2: 100%   Filed Weights   05/23/24 1332  Weight: 168 lb 1.6 oz (76.2 kg)    Physical Exam MEASUREMENTS: Weight- 168.  (exam performed in the presence of a chaperone)  LABORATORY DATA:  I have reviewed the data as listed    Latest Ref Rng & Units 03/22/2024    2:06 PM 01/19/2024    9:42 AM 01/12/2024    2:51 PM  CMP  Glucose 70 - 99 mg/dL 871  824    BUN 8 - 23 mg/dL 22  23    Creatinine 9.38 - 1.24 mg/dL 7.90  7.98  7.79   Sodium 135 - 145 mmol/L 135  141    Potassium 3.5 - 5.1 mmol/L 5.1  4.5    Chloride 98 - 111 mmol/L 105  111    CO2 22 - 32 mmol/L 26  25    Calcium  8.9 - 10.3 mg/dL 8.4  7.9    Total Protein 6.5 - 8.1 g/dL 6.8  6.8    Total Bilirubin 0.0 - 1.2 mg/dL 0.4  0.5    Alkaline Phos 38 - 126 U/L 57  44    AST 15 - 41 U/L 14  13    ALT 0 - 44 U/L 10  9      Lab Results  Component Value Date   WBC 5.7 05/23/2024   HGB 10.9 (L) 05/23/2024   HCT 31.8 (L) 05/23/2024   MCV 94.6 05/23/2024   PLT 156 05/23/2024   NEUTROABS 4.0 05/23/2024    ASSESSMENT & PLAN:  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  Ultrasound: 3.9 cm irregular hypoechoic mass retroareolar left breast, 4 abnormal left axillary lymph nodes with cortical thickening, mild right gynecomastia. Biopsy: Grade 2 IDC, LVI present, lymph node positive, ER 100%, PR 50%, Ki-67 30%, HER2 2+, FISH negative ratio 1.08    CT CAP 01/11/2023: Left breast mass, left axillary lymph nodes, mediastinal and hilar lymphadenopathy, multiple bilateral lung nodules, sclerotic lesions throughout thoracic spine, pelvis and lumbar spine, multiple lesions left and right kidney (suggestive of solid kidney cancer)   Current treatment: Tamoxifen  with Verzenio  started 01/16/2023 [name] toxicities: Severe fatigue: Stable: We discussed taking oral B12 supplement. Continue with the same dosage of [name].  CT scan on 04/17/2023 interval improvement of left breast mass, left axillary mediastinal lymph nodes and bilateral pulmonary nodules.  Widespread bone metastases, indeterminate lesions lower poles of both kidneys complex cysts CT CAP 07/15/2023: Further interval improvement in the left breast mass as well as left axillary, mediastinal and pulmonary nodules, similar bone metastases CT CAP 01/17/2024: Stable bone mets, stable left breast mass and left axillary lymph node, 4.4 cm ascending aortic aneurysm   CA 27-29: 66.8 on 01/19/2024, 82.6 on 10/20/2023, was 145.9 on 07/21/2023 (it was 1849 at 5 months ago), 64.5 on 03/22/2024   Chronic kidney disease: Follows with nephrology.   continue current therapy and rescan in January 2026 and follow-up 1 week later    No orders of the defined types were placed in this encounter.  The patient has a good understanding of the overall plan. he agrees with it. he will call with any problems that may develop before the next visit here.  I personally spent a total of 30 minutes in the care of the patient today including preparing to see the patient, getting/reviewing separately obtained history, performing a medically  appropriate exam/evaluation, counseling and educating, placing orders, referring and communicating with other health care professionals, documenting clinical information in the EHR, independently interpreting results, communicating results, and coordinating care.   Viinay K Madyson Lukach, MD 05/23/24

## 2024-05-24 LAB — CANCER ANTIGEN 27.29: CA 27.29: 61.9 U/mL — ABNORMAL HIGH (ref 0.0–38.6)

## 2024-05-26 ENCOUNTER — Other Ambulatory Visit: Payer: Self-pay

## 2024-05-30 ENCOUNTER — Other Ambulatory Visit: Payer: Self-pay | Admitting: Pharmacy Technician

## 2024-05-30 ENCOUNTER — Other Ambulatory Visit: Payer: Self-pay

## 2024-05-30 NOTE — Progress Notes (Signed)
 Specialty Pharmacy Refill Coordination Note  Jesse Hoffman is a 88 y.o. male contacted today regarding refills of specialty medication(s) Abemaciclib  (VERZENIO )   Patient requested [name] at [facility] at [place] date: 06/01/24   Medication will be filled on: 05/31/24

## 2024-06-10 ENCOUNTER — Telehealth: Payer: Self-pay

## 2024-06-10 NOTE — Telephone Encounter (Signed)
 Oral Oncology Patient Advocate Encounter   Received notification that prior authorization for Verzenio  is due for renewal.   PA submitted on 06/10/2024 Key [id number] Status is pending      Charlott Hamilton,  CPhT-Adv  she/her/hers [facility]  [facility] Specialty Pharm[facility] Services Pharmacy Technician Patient Advocate Specialist III [facility] Phone: [phone]  Fax: [phone] [email or web address]

## 2024-06-10 NOTE — Telephone Encounter (Signed)
 Oral Oncology Patient Advocate Encounter  Prior Authorization renewal for Verzenio  has been approved.    PA# [id number] Effective dates: 05/2024 through 06/22/2025    Charlott Hamilton,  CPhT-Adv  she/her/hers [facility]  [facility] Special[facility] Pharm[facility] Services Pharmacy Technician Patient Advocate Specialist III [facility] Phone: [phone]  Fax: [phone] [email or web address]

## 2024-06-21 ENCOUNTER — Other Ambulatory Visit (HOSPITAL_COMMUNITY): Payer: Self-pay

## 2024-06-24 ENCOUNTER — Other Ambulatory Visit: Payer: Self-pay

## 2024-06-24 NOTE — Progress Notes (Signed)
 Specialty Pharmacy Refill Coordination Note  Jesse Hoffman is a 89 y.o. male contacted today regarding refills of specialty medication(s) Abemaciclib  (VERZENIO )   Patient requested [name] at [facility] at [place] date: 06/28/24   Medication will be filled on: 06/27/24

## 2024-06-27 ENCOUNTER — Other Ambulatory Visit: Payer: Self-pay

## 2024-07-01 ENCOUNTER — Ambulatory Visit (HOSPITAL_COMMUNITY)
Admission: RE | Admit: 2024-07-01 | Discharge: 2024-07-01 | Disposition: A | Source: Ambulatory Visit | Attending: Cardiology | Admitting: Cardiology

## 2024-07-01 ENCOUNTER — Ambulatory Visit: Payer: Self-pay | Admitting: Physician Assistant

## 2024-07-01 DIAGNOSIS — E785 Hyperlipidemia, unspecified: Secondary | ICD-10-CM | POA: Insufficient documentation

## 2024-07-01 DIAGNOSIS — I350 Nonrheumatic aortic (valve) stenosis: Secondary | ICD-10-CM | POA: Insufficient documentation

## 2024-07-01 DIAGNOSIS — I10 Essential (primary) hypertension: Secondary | ICD-10-CM | POA: Diagnosis present

## 2024-07-01 DIAGNOSIS — R011 Cardiac murmur, unspecified: Secondary | ICD-10-CM | POA: Insufficient documentation

## 2024-07-01 LAB — ECHOCARDIOGRAM COMPLETE
AR max vel: 1.93 cm2
AV Area VTI: 2.49 cm2
AV Area mean vel: 1.91 cm2
AV Mean grad: 14.6 mmHg
AV Peak grad: 26.1 mmHg
Ao pk vel: 2.55 m/s
Area-P 1/2: 2.87 cm2
S' Lateral: 2.6 cm

## 2024-07-18 ENCOUNTER — Ambulatory Visit (HOSPITAL_COMMUNITY)
Admission: RE | Admit: 2024-07-18 | Discharge: 2024-07-18 | Disposition: A | Discharge status: Home / Self Care | Source: Ambulatory Visit | Attending: Hematology and Oncology | Admitting: Hematology and Oncology

## 2024-07-18 ENCOUNTER — Other Ambulatory Visit: Payer: Self-pay | Admitting: Hematology and Oncology

## 2024-07-18 DIAGNOSIS — Z170 Estrogen receptor positive status [ER+]: Secondary | ICD-10-CM | POA: Diagnosis present

## 2024-07-18 DIAGNOSIS — C50922 Malignant neoplasm of unspecified site of left male breast: Secondary | ICD-10-CM | POA: Diagnosis present

## 2024-07-18 DIAGNOSIS — E119 Type 2 diabetes mellitus without complications: Secondary | ICD-10-CM | POA: Insufficient documentation

## 2024-07-18 DIAGNOSIS — C7951 Secondary malignant neoplasm of bone: Secondary | ICD-10-CM

## 2024-07-20 ENCOUNTER — Other Ambulatory Visit (HOSPITAL_COMMUNITY): Payer: Self-pay

## 2024-07-20 LAB — POCT I-STAT CREATININE: Creatinine, Ser: 2.2 mg/dL — ABNORMAL HIGH (ref 0.61–1.24)

## 2024-07-22 ENCOUNTER — Other Ambulatory Visit (HOSPITAL_COMMUNITY): Payer: Self-pay

## 2024-07-26 ENCOUNTER — Inpatient Hospital Stay

## 2024-07-26 ENCOUNTER — Other Ambulatory Visit: Payer: Self-pay

## 2024-07-26 ENCOUNTER — Inpatient Hospital Stay: Attending: Hematology and Oncology | Admitting: Hematology and Oncology

## 2024-07-26 VITALS — BP 130/78 | HR 106 | Temp 98.2°F | Resp 18 | Ht 70.0 in | Wt 168.2 lb

## 2024-07-26 DIAGNOSIS — C50922 Malignant neoplasm of unspecified site of left male breast: Secondary | ICD-10-CM

## 2024-07-26 DIAGNOSIS — Z170 Estrogen receptor positive status [ER+]: Secondary | ICD-10-CM | POA: Diagnosis not present

## 2024-07-26 LAB — CBC WITH DIFFERENTIAL (CANCER CENTER ONLY)
Abs Immature Granulocytes: 0.01 10*3/uL (ref 0.00–0.07)
Basophils Absolute: 0 10*3/uL (ref 0.0–0.1)
Basophils Relative: 1 %
Eosinophils Absolute: 0.2 10*3/uL (ref 0.0–0.5)
Eosinophils Relative: 5 %
HCT: 33.8 % — ABNORMAL LOW (ref 39.0–52.0)
Hemoglobin: 11.5 g/dL — ABNORMAL LOW (ref 13.0–17.0)
Immature Granulocytes: 0 %
Lymphocytes Relative: 22 %
Lymphs Abs: 0.9 10*3/uL (ref 0.7–4.0)
MCH: 32.1 pg (ref 26.0–34.0)
MCHC: 34 g/dL (ref 30.0–36.0)
MCV: 94.4 fL (ref 80.0–100.0)
Monocytes Absolute: 0.3 10*3/uL (ref 0.1–1.0)
Monocytes Relative: 8 %
Neutro Abs: 2.6 10*3/uL (ref 1.7–7.7)
Neutrophils Relative %: 64 %
Platelet Count: 130 10*3/uL — ABNORMAL LOW (ref 150–400)
RBC: 3.58 MIL/uL — ABNORMAL LOW (ref 4.22–5.81)
RDW: 12.6 % (ref 11.5–15.5)
WBC Count: 4 10*3/uL (ref 4.0–10.5)
nRBC: 0 % (ref 0.0–0.2)

## 2024-07-26 LAB — CMP (CANCER CENTER ONLY)
ALT: 10 U/L (ref 0–44)
AST: 18 U/L (ref 15–41)
Albumin: 4 g/dL (ref 3.5–5.0)
Alkaline Phosphatase: 57 U/L (ref 38–126)
Anion gap: 12 (ref 5–15)
BUN: 30 mg/dL — ABNORMAL HIGH (ref 8–23)
CO2: 23 mmol/L (ref 22–32)
Calcium: 9.5 mg/dL (ref 8.9–10.3)
Chloride: 104 mmol/L (ref 98–111)
Creatinine: 2.16 mg/dL — ABNORMAL HIGH (ref 0.61–1.24)
GFR, Estimated: 29 mL/min — ABNORMAL LOW
Glucose, Bld: 152 mg/dL — ABNORMAL HIGH (ref 70–99)
Potassium: 4.4 mmol/L (ref 3.5–5.1)
Sodium: 139 mmol/L (ref 135–145)
Total Bilirubin: 0.5 mg/dL (ref 0.0–1.2)
Total Protein: 7.1 g/dL (ref 6.5–8.1)

## 2024-07-26 NOTE — Assessment & Plan Note (Signed)
 12/09/2022: mammogram detected asymmetry/mass retroareolar left breast with left nipple retraction and skin thickening. Ultrasound: 3.9 cm irregular hypoechoic mass retroareolar left breast, 4 abnormal left axillary lymph nodes with cortical thickening, mild right gynecomastia. Biopsy: Grade 2 IDC, LVI present, lymph node positive, ER 100%, PR 50%, Ki-67 30%, HER2 2+, FISH negative ratio 1.08    CT CAP 01/11/2023: Left breast mass, left axillary lymph nodes, mediastinal and hilar lymphadenopathy, multiple bilateral lung nodules, sclerotic lesions throughout thoracic spine, pelvis and lumbar spine, multiple lesions left and right kidney (suggestive of solid kidney cancer)   Current treatment: Tamoxifen  with Verzenio  started 01/16/2023 [name] toxicities: Severe fatigue: Stable: We discussed taking oral B12 supplement. Continue with the same dosage of [name].   CT scan on 04/17/2023 interval improvement of left breast mass, left axillary mediastinal lymph nodes and bilateral pulmonary nodules.  Widespread bone metastases, indeterminate lesions lower poles of both kidneys complex cysts CT CAP 07/15/2023: Further interval improvement in the left breast mass as well as left axillary, mediastinal and pulmonary nodules, similar bone metastases CT CAP 01/17/2024: Stable bone mets, stable left breast mass and left axillary lymph node, 4.4 cm ascending aortic aneurysm CT CAP 07/18/2024: Extensive sclerotic bone metastases.  No new metastases   CA 27-29: 66.8 on 01/19/2024, 82.6 on 10/20/2023, was 145.9 on 07/21/2023 (it was 1849 at 5 months ago), 64.5 on 03/22/2024   Chronic kidney disease: Follows with nephrology.   continue current therapy and follow-up with labs in 3 months

## 2024-07-27 LAB — CANCER ANTIGEN 27.29: CA 27.29: 59 U/mL — ABNORMAL HIGH (ref 0.0–38.6)

## 2024-07-28 ENCOUNTER — Other Ambulatory Visit: Payer: Self-pay
